# Patient Record
Sex: Female | Born: 1935 | Race: Black or African American | Hispanic: No | Smoking: Never smoker
Health system: Southern US, Community
[De-identification: ages and names within clinical notes are randomized; demographics above are authoritative.]

## PROBLEM LIST (undated history)

## (undated) DIAGNOSIS — I509 Heart failure, unspecified: Secondary | ICD-10-CM

## (undated) DIAGNOSIS — C801 Malignant (primary) neoplasm, unspecified: Secondary | ICD-10-CM

## (undated) DIAGNOSIS — M75111 Incomplete rotator cuff tear or rupture of right shoulder, not specified as traumatic: Secondary | ICD-10-CM

## (undated) DIAGNOSIS — M75112 Incomplete rotator cuff tear or rupture of left shoulder, not specified as traumatic: Secondary | ICD-10-CM

## (undated) DIAGNOSIS — I639 Cerebral infarction, unspecified: Secondary | ICD-10-CM

## (undated) DIAGNOSIS — J449 Chronic obstructive pulmonary disease, unspecified: Secondary | ICD-10-CM

## (undated) DIAGNOSIS — F419 Anxiety disorder, unspecified: Secondary | ICD-10-CM

## (undated) DIAGNOSIS — J45909 Unspecified asthma, uncomplicated: Secondary | ICD-10-CM

## (undated) DIAGNOSIS — E785 Hyperlipidemia, unspecified: Secondary | ICD-10-CM

## (undated) DIAGNOSIS — N289 Disorder of kidney and ureter, unspecified: Secondary | ICD-10-CM

## (undated) DIAGNOSIS — I340 Nonrheumatic mitral (valve) insufficiency: Secondary | ICD-10-CM

## (undated) DIAGNOSIS — I480 Paroxysmal atrial fibrillation: Secondary | ICD-10-CM

## (undated) DIAGNOSIS — M21372 Foot drop, left foot: Secondary | ICD-10-CM

## (undated) DIAGNOSIS — I10 Essential (primary) hypertension: Secondary | ICD-10-CM

## (undated) HISTORY — DX: Anxiety disorder, unspecified: F41.9

## (undated) HISTORY — DX: Malignant (primary) neoplasm, unspecified: C80.1

## (undated) HISTORY — PX: LIVER RESECTION: SHX1977

## (undated) HISTORY — DX: Paroxysmal atrial fibrillation: I48.0

## (undated) HISTORY — PX: OTHER SURGICAL HISTORY: SHX169

## (undated) HISTORY — DX: Essential (primary) hypertension: I10

## (undated) HISTORY — DX: Cerebral infarction, unspecified: I63.9

## (undated) HISTORY — DX: Unspecified asthma, uncomplicated: J45.909

## (undated) HISTORY — PX: JOINT REPLACEMENT: SHX530

## (undated) HISTORY — DX: Hyperlipidemia, unspecified: E78.5

## (undated) HISTORY — PX: BACK SURGERY: SHX140

---

## 2009-02-02 ENCOUNTER — Ambulatory Visit: Payer: Self-pay | Admitting: Unknown Physician Specialty

## 2009-02-03 ENCOUNTER — Ambulatory Visit: Payer: Self-pay | Admitting: Oncology

## 2009-02-15 ENCOUNTER — Ambulatory Visit: Payer: Self-pay | Admitting: Oncology

## 2009-03-06 ENCOUNTER — Ambulatory Visit: Payer: Self-pay | Admitting: Oncology

## 2009-05-06 ENCOUNTER — Ambulatory Visit: Payer: Self-pay | Admitting: Oncology

## 2009-05-29 ENCOUNTER — Ambulatory Visit: Payer: Self-pay | Admitting: Oncology

## 2009-06-06 ENCOUNTER — Ambulatory Visit: Payer: Self-pay | Admitting: Oncology

## 2009-08-18 ENCOUNTER — Ambulatory Visit: Payer: Self-pay | Admitting: Unknown Physician Specialty

## 2009-11-03 ENCOUNTER — Ambulatory Visit: Payer: Self-pay | Admitting: Oncology

## 2009-11-15 ENCOUNTER — Ambulatory Visit: Payer: Self-pay | Admitting: Oncology

## 2009-11-17 ENCOUNTER — Ambulatory Visit: Payer: Self-pay | Admitting: Oncology

## 2009-12-04 ENCOUNTER — Ambulatory Visit: Payer: Self-pay | Admitting: Oncology

## 2009-12-24 ENCOUNTER — Inpatient Hospital Stay: Payer: Self-pay | Admitting: Unknown Physician Specialty

## 2010-01-26 ENCOUNTER — Ambulatory Visit: Payer: Self-pay | Admitting: Family

## 2010-04-05 ENCOUNTER — Ambulatory Visit: Payer: Self-pay

## 2010-04-10 ENCOUNTER — Ambulatory Visit: Payer: Self-pay | Admitting: Unknown Physician Specialty

## 2010-05-21 ENCOUNTER — Ambulatory Visit: Payer: Self-pay | Admitting: Oncology

## 2010-06-06 ENCOUNTER — Ambulatory Visit: Payer: Self-pay | Admitting: Oncology

## 2010-11-22 ENCOUNTER — Ambulatory Visit: Payer: Self-pay | Admitting: Oncology

## 2010-12-05 ENCOUNTER — Ambulatory Visit: Payer: Self-pay | Admitting: Oncology

## 2010-12-07 ENCOUNTER — Ambulatory Visit: Payer: Self-pay | Admitting: Oncology

## 2010-12-08 ENCOUNTER — Ambulatory Visit: Payer: Self-pay | Admitting: Oncology

## 2011-04-01 ENCOUNTER — Ambulatory Visit: Payer: Self-pay | Admitting: Oncology

## 2011-04-06 ENCOUNTER — Ambulatory Visit: Payer: Self-pay | Admitting: Oncology

## 2011-04-09 ENCOUNTER — Inpatient Hospital Stay: Payer: Self-pay | Admitting: Family Medicine

## 2011-04-12 ENCOUNTER — Ambulatory Visit: Payer: Self-pay | Admitting: Oncology

## 2011-05-07 ENCOUNTER — Ambulatory Visit: Payer: Self-pay | Admitting: Oncology

## 2011-05-08 ENCOUNTER — Ambulatory Visit: Payer: Self-pay | Admitting: Gastroenterology

## 2011-05-09 ENCOUNTER — Ambulatory Visit: Payer: Self-pay | Admitting: Oncology

## 2011-05-14 LAB — MAGNESIUM: Magnesium: 1.7 mg/dL — ABNORMAL LOW

## 2011-05-14 LAB — CBC CANCER CENTER
Basophil #: 0.1 x10 3/mm (ref 0.0–0.1)
Eosinophil #: 0.7 x10 3/mm (ref 0.0–0.7)
HCT: 27.7 % — ABNORMAL LOW (ref 35.0–47.0)
Lymphocyte #: 1.5 x10 3/mm (ref 1.0–3.6)
Lymphocyte %: 16.7 %
MCH: 28.2 pg (ref 26.0–34.0)
Monocyte #: 0.8 x10 3/mm — ABNORMAL HIGH (ref 0.0–0.7)
Monocyte %: 9 %
Neutrophil #: 6 x10 3/mm (ref 1.4–6.5)
Platelet: 230 x10 3/mm (ref 150–440)
RDW: 13.3 % (ref 11.5–14.5)
WBC: 9 x10 3/mm (ref 3.6–11.0)

## 2011-05-14 LAB — COMPREHENSIVE METABOLIC PANEL
Bilirubin,Total: 0.2 mg/dL (ref 0.2–1.0)
Calcium, Total: 9 mg/dL (ref 8.5–10.1)
Chloride: 108 mmol/L — ABNORMAL HIGH (ref 98–107)
Co2: 28 mmol/L (ref 21–32)
Creatinine: 0.92 mg/dL (ref 0.60–1.30)
EGFR (Non-African Amer.): >60
Glucose: 95 mg/dL (ref 65–99)
Osmolality: 290 (ref 275–301)
SGPT (ALT): 10 U/L — ABNORMAL LOW
Total Protein: 6.9 g/dL (ref 6.4–8.2)

## 2011-06-07 ENCOUNTER — Ambulatory Visit: Payer: Self-pay | Admitting: Oncology

## 2011-06-25 LAB — CBC CANCER CENTER
Basophil #: 0 x10 3/mm (ref 0.0–0.1)
Eosinophil #: 0 x10 3/mm (ref 0.0–0.7)
Lymphocyte #: 0.7 x10 3/mm — ABNORMAL LOW (ref 1.0–3.6)
Monocyte %: 0.9 %
Neutrophil #: 10.8 x10 3/mm — ABNORMAL HIGH (ref 1.4–6.5)
Neutrophil %: 92.5 %
Platelet: 209 x10 3/mm (ref 150–440)
RBC: 3.1 10*6/uL — ABNORMAL LOW (ref 3.80–5.20)
WBC: 11.6 x10 3/mm — ABNORMAL HIGH (ref 3.6–11.0)

## 2011-06-25 LAB — COMPREHENSIVE METABOLIC PANEL
Albumin: 3.4 g/dL (ref 3.4–5.0)
Alkaline Phosphatase: 50 U/L (ref 50–136)
BUN: 21 mg/dL — ABNORMAL HIGH (ref 7–18)
Chloride: 106 mmol/L (ref 98–107)
EGFR (African American): 49 — ABNORMAL LOW
EGFR (Non-African Amer.): 41 — ABNORMAL LOW
Glucose: 136 mg/dL — ABNORMAL HIGH (ref 65–99)
Osmolality: 288 (ref 275–301)
SGOT(AST): 32 U/L (ref 15–37)
SGPT (ALT): 18 U/L
Sodium: 142 mmol/L (ref 136–145)
Total Protein: 6.6 g/dL (ref 6.4–8.2)

## 2011-06-25 LAB — IRON AND TIBC: Iron Saturation: 18 %

## 2011-06-25 LAB — MAGNESIUM: Magnesium: 1.8 mg/dL

## 2011-07-05 ENCOUNTER — Ambulatory Visit: Payer: Self-pay | Admitting: Oncology

## 2011-07-17 ENCOUNTER — Ambulatory Visit: Payer: Self-pay | Admitting: Unknown Physician Specialty

## 2011-09-17 ENCOUNTER — Ambulatory Visit: Payer: Self-pay | Admitting: Oncology

## 2011-09-17 LAB — COMPREHENSIVE METABOLIC PANEL WITH GFR
Albumin: 3.7 g/dL (ref 3.4–5.0)
Alkaline Phosphatase: 40 U/L — ABNORMAL LOW (ref 50–136)
Anion Gap: 7 (ref 7–16)
BUN: 18 mg/dL (ref 7–18)
Bilirubin,Total: 0.2 mg/dL (ref 0.2–1.0)
Calcium, Total: 8.5 mg/dL (ref 8.5–10.1)
Chloride: 107 mmol/L (ref 98–107)
Co2: 28 mmol/L (ref 21–32)
Creatinine: 1.13 mg/dL (ref 0.60–1.30)
EGFR (African American): 55 — ABNORMAL LOW
EGFR (Non-African Amer.): 47 — ABNORMAL LOW
Glucose: 103 mg/dL — ABNORMAL HIGH (ref 65–99)
Osmolality: 285 (ref 275–301)
Potassium: 4.1 mmol/L (ref 3.5–5.1)
SGOT(AST): 36 U/L (ref 15–37)
SGPT (ALT): 17 U/L
Sodium: 142 mmol/L (ref 136–145)
Total Protein: 6.8 g/dL (ref 6.4–8.2)

## 2011-09-17 LAB — CBC CANCER CENTER
Basophil #: 0.1 x10 3/mm (ref 0.0–0.1)
Basophil %: 1.1 %
Eosinophil #: 0.8 x10 3/mm — ABNORMAL HIGH (ref 0.0–0.7)
Eosinophil %: 13.1 %
HCT: 27.3 % — ABNORMAL LOW (ref 35.0–47.0)
HGB: 8.6 g/dL — ABNORMAL LOW (ref 12.0–16.0)
Lymphocyte %: 20.4 %
Lymphs Abs: 1.2 x10 3/mm (ref 1.0–3.6)
MCH: 27.7 pg (ref 26.0–34.0)
MCHC: 31.4 g/dL — ABNORMAL LOW (ref 32.0–36.0)
MCV: 88 fL (ref 80–100)
Monocyte #: 0.5 x10 3/mm (ref 0.2–0.9)
Monocyte %: 8.9 %
Neutrophil #: 3.4 x10 3/mm (ref 1.4–6.5)
Neutrophil %: 56.5 %
Platelet: 185 x10 3/mm (ref 150–440)
RBC: 3.09 x10 6/mm — ABNORMAL LOW (ref 3.80–5.20)
RDW: 13.4 % (ref 11.5–14.5)
WBC: 6 x10 3/mm (ref 3.6–11.0)

## 2011-10-05 ENCOUNTER — Ambulatory Visit: Payer: Self-pay | Admitting: Oncology

## 2011-10-10 LAB — CBC CANCER CENTER
Basophil %: 0.6 %
Eosinophil %: 4.8 %
HCT: 27.1 % — ABNORMAL LOW (ref 35.0–47.0)
HGB: 8.6 g/dL — ABNORMAL LOW (ref 12.0–16.0)
Lymphocyte #: 2.2 x10 3/mm (ref 1.0–3.6)
MCHC: 31.7 g/dL — ABNORMAL LOW (ref 32.0–36.0)
Neutrophil #: 3.8 x10 3/mm (ref 1.4–6.5)
RBC: 3.05 10*6/uL — ABNORMAL LOW (ref 3.80–5.20)
RDW: 13.7 % (ref 11.5–14.5)
WBC: 7.2 x10 3/mm (ref 3.6–11.0)

## 2011-11-04 ENCOUNTER — Ambulatory Visit: Payer: Self-pay | Admitting: Oncology

## 2011-11-17 ENCOUNTER — Emergency Department: Payer: Self-pay | Admitting: Emergency Medicine

## 2011-11-17 LAB — CBC WITH DIFFERENTIAL/PLATELET
Eosinophil #: 0.5 10*3/uL (ref 0.0–0.7)
HCT: 27.7 % — ABNORMAL LOW (ref 35.0–47.0)
Lymphocyte %: 26.6 %
Monocyte %: 11.6 %
Neutrophil %: 50.3 %
Platelet: 153 10*3/uL (ref 150–440)
RBC: 3.1 10*6/uL — ABNORMAL LOW (ref 3.80–5.20)
RDW: 14.1 % (ref 11.5–14.5)
WBC: 5 10*3/uL (ref 3.6–11.0)

## 2011-11-17 LAB — BASIC METABOLIC PANEL WITH GFR
Anion Gap: 6 — ABNORMAL LOW
BUN: 17 mg/dL
Calcium, Total: 8.3 mg/dL — ABNORMAL LOW
Chloride: 110 mmol/L — ABNORMAL HIGH
Co2: 28 mmol/L
Creatinine: 1.22 mg/dL
EGFR (African American): 50 — ABNORMAL LOW
EGFR (Non-African Amer.): 43 — ABNORMAL LOW
Glucose: 74 mg/dL
Osmolality: 287
Potassium: 3.9 mmol/L
Sodium: 144 mmol/L

## 2012-01-09 ENCOUNTER — Ambulatory Visit: Payer: Self-pay | Admitting: Oncology

## 2012-01-09 LAB — CBC CANCER CENTER
Basophil #: 0.1 x10 3/mm (ref 0.0–0.1)
Basophil %: 0.9 %
Eosinophil #: 0.2 x10 3/mm (ref 0.0–0.7)
HGB: 8.7 g/dL — ABNORMAL LOW (ref 12.0–16.0)
Lymphocyte #: 1.6 x10 3/mm (ref 1.0–3.6)
MCH: 26.8 pg (ref 26.0–34.0)
MCHC: 31 g/dL — ABNORMAL LOW (ref 32.0–36.0)
MCV: 87 fL (ref 80–100)
Monocyte #: 0.7 x10 3/mm (ref 0.2–0.9)
Neutrophil %: 56.1 %

## 2012-01-09 LAB — FERRITIN: Ferritin (ARMC): 13 ng/mL (ref 8–388)

## 2012-01-09 LAB — IRON AND TIBC
Iron Bind.Cap.(Total): 400 ug/dL (ref 250–450)
Iron Saturation: 11 %

## 2012-01-09 LAB — COMPREHENSIVE METABOLIC PANEL
Alkaline Phosphatase: 71 U/L (ref 50–136)
Anion Gap: 5 — ABNORMAL LOW (ref 7–16)
BUN: 26 mg/dL — ABNORMAL HIGH (ref 7–18)
Bilirubin,Total: 0.2 mg/dL (ref 0.2–1.0)
Calcium, Total: 8.7 mg/dL (ref 8.5–10.1)
Co2: 28 mmol/L (ref 21–32)
EGFR (Non-African Amer.): 39 — ABNORMAL LOW
Glucose: 89 mg/dL (ref 65–99)
Osmolality: 285 (ref 275–301)
Potassium: 4.7 mmol/L (ref 3.5–5.1)
SGPT (ALT): 20 U/L (ref 12–78)

## 2012-02-04 ENCOUNTER — Ambulatory Visit: Payer: Self-pay | Admitting: Oncology

## 2012-04-23 ENCOUNTER — Ambulatory Visit: Payer: Self-pay | Admitting: Oncology

## 2012-04-23 LAB — CBC CANCER CENTER
Basophil #: 0 x10 3/mm (ref 0.0–0.1)
Basophil %: 0.4 %
HCT: 28.1 % — ABNORMAL LOW (ref 35.0–47.0)
Lymphocyte #: 1.5 x10 3/mm (ref 1.0–3.6)
MCH: 26.9 pg (ref 26.0–34.0)
MCV: 85 fL (ref 80–100)
Monocyte %: 12.1 %
Neutrophil #: 5.3 x10 3/mm (ref 1.4–6.5)
Platelet: 140 x10 3/mm — ABNORMAL LOW (ref 150–440)
RDW: 15.7 % — ABNORMAL HIGH (ref 11.5–14.5)
WBC: 7.8 x10 3/mm (ref 3.6–11.0)

## 2012-04-23 LAB — COMPREHENSIVE METABOLIC PANEL
Albumin: 3.9 g/dL (ref 3.4–5.0)
Alkaline Phosphatase: 44 U/L — ABNORMAL LOW (ref 50–136)
Calcium, Total: 8.8 mg/dL (ref 8.5–10.1)
Co2: 24 mmol/L (ref 21–32)
Glucose: 72 mg/dL (ref 65–99)
Osmolality: 291 (ref 275–301)
Potassium: 3.7 mmol/L (ref 3.5–5.1)
SGOT(AST): 40 U/L — ABNORMAL HIGH (ref 15–37)

## 2012-05-06 ENCOUNTER — Ambulatory Visit: Payer: Self-pay | Admitting: Oncology

## 2012-05-15 ENCOUNTER — Ambulatory Visit: Payer: Self-pay | Admitting: Cardiology

## 2012-05-29 ENCOUNTER — Emergency Department: Payer: Self-pay | Admitting: Emergency Medicine

## 2012-07-30 ENCOUNTER — Ambulatory Visit: Payer: Self-pay | Admitting: Oncology

## 2012-09-07 ENCOUNTER — Inpatient Hospital Stay: Payer: Self-pay | Admitting: Internal Medicine

## 2012-09-07 LAB — CK TOTAL AND CKMB (NOT AT ARMC)
CK, Total: 499 U/L — ABNORMAL HIGH (ref 21–215)
CK, Total: 460 U/L — ABNORMAL HIGH (ref 21–215)
CK-MB: 6.4 ng/mL — ABNORMAL HIGH (ref 0.5–3.6)
CK-MB: 5.5 ng/mL — ABNORMAL HIGH (ref 0.5–3.6)

## 2012-09-07 LAB — URINALYSIS, COMPLETE
Nitrite: NEGATIVE
Ph: 6 (ref 4.5–8.0)
Protein: 30
Specific Gravity: 1.018 (ref 1.003–1.030)

## 2012-09-07 LAB — CBC
HGB: 8.6 g/dL — ABNORMAL LOW (ref 12.0–16.0)
Platelet: 170 10*3/uL (ref 150–440)
RBC: 3.15 10*6/uL — ABNORMAL LOW (ref 3.80–5.20)
RDW: 13.5 % (ref 11.5–14.5)

## 2012-09-07 LAB — TROPONIN I
Troponin-I: 0.13 ng/mL — ABNORMAL HIGH
Troponin-I: 0.11 ng/mL — ABNORMAL HIGH

## 2012-09-07 LAB — COMPREHENSIVE METABOLIC PANEL
Albumin: 3.6 g/dL (ref 3.4–5.0)
Alkaline Phosphatase: 32 U/L — ABNORMAL LOW (ref 50–136)
Anion Gap: 5 — ABNORMAL LOW (ref 7–16)
Calcium, Total: 8.7 mg/dL (ref 8.5–10.1)
Chloride: 113 mmol/L — ABNORMAL HIGH (ref 98–107)
Co2: 28 mmol/L (ref 21–32)
Glucose: 75 mg/dL (ref 65–99)
SGPT (ALT): 17 U/L (ref 12–78)
Total Protein: 6.2 g/dL — ABNORMAL LOW (ref 6.4–8.2)

## 2012-09-07 LAB — PRO B NATRIURETIC PEPTIDE: B-Type Natriuretic Peptide: 3401 pg/mL — ABNORMAL HIGH (ref 0–450)

## 2012-09-08 LAB — BASIC METABOLIC PANEL
Anion Gap: 4 — ABNORMAL LOW (ref 7–16)
BUN: 18 mg/dL (ref 7–18)
Calcium, Total: 8.4 mg/dL — ABNORMAL LOW (ref 8.5–10.1)
Chloride: 107 mmol/L (ref 98–107)
Co2: 31 mmol/L (ref 21–32)
Osmolality: 289 (ref 275–301)
Potassium: 4.1 mmol/L (ref 3.5–5.1)

## 2012-09-08 LAB — LIPID PANEL
Cholesterol: 178 mg/dL (ref 0–200)
HDL Cholesterol: 77 mg/dL — ABNORMAL HIGH (ref 40–60)
Triglycerides: 31 mg/dL (ref 0–200)

## 2012-09-08 LAB — TROPONIN I: Troponin-I: 0.09 ng/mL — ABNORMAL HIGH

## 2012-09-08 LAB — CK TOTAL AND CKMB (NOT AT ARMC)
CK, Total: 462 U/L — ABNORMAL HIGH (ref 21–215)
CK-MB: 4.5 ng/mL — ABNORMAL HIGH (ref 0.5–3.6)

## 2012-09-08 LAB — MAGNESIUM: Magnesium: 1.5 mg/dL — ABNORMAL LOW

## 2012-09-08 LAB — TSH: Thyroid Stimulating Horm: 0.461 u[IU]/mL

## 2012-09-09 LAB — BASIC METABOLIC PANEL
Anion Gap: 8 (ref 7–16)
BUN: 26 mg/dL — ABNORMAL HIGH (ref 7–18)
BUN: 27 mg/dL — ABNORMAL HIGH (ref 7–18)
Calcium, Total: 8.2 mg/dL — ABNORMAL LOW (ref 8.5–10.1)
Calcium, Total: 8.2 mg/dL — ABNORMAL LOW (ref 8.5–10.1)
Chloride: 101 mmol/L (ref 98–107)
Chloride: 101 mmol/L (ref 98–107)
Co2: 28 mmol/L (ref 21–32)
Co2: 28 mmol/L (ref 21–32)
Creatinine: 1.39 mg/dL — ABNORMAL HIGH (ref 0.60–1.30)
EGFR (African American): 42 — ABNORMAL LOW
Glucose: 156 mg/dL — ABNORMAL HIGH (ref 65–99)
Glucose: 138 mg/dL — ABNORMAL HIGH (ref 65–99)
Osmolality: 276 (ref 275–301)
Sodium: 137 mmol/L (ref 136–145)
Sodium: 134 mmol/L — ABNORMAL LOW (ref 136–145)

## 2012-09-09 LAB — HEMOGLOBIN: HGB: 8.1 g/dL — ABNORMAL LOW (ref 12.0–16.0)

## 2012-09-10 LAB — BASIC METABOLIC PANEL
Anion Gap: 8 (ref 7–16)
Anion Gap: 12 (ref 7–16)
BUN: 36 mg/dL — ABNORMAL HIGH (ref 7–18)
BUN: 37 mg/dL — ABNORMAL HIGH (ref 7–18)
Calcium, Total: 8.3 mg/dL — ABNORMAL LOW (ref 8.5–10.1)
Calcium, Total: 8 mg/dL — ABNORMAL LOW (ref 8.5–10.1)
Chloride: 98 mmol/L (ref 98–107)
Co2: 30 mmol/L (ref 21–32)
Co2: 26 mmol/L (ref 21–32)
Creatinine: 1.47 mg/dL — ABNORMAL HIGH (ref 0.60–1.30)
Creatinine: 1.6 mg/dL — ABNORMAL HIGH (ref 0.60–1.30)
EGFR (African American): 39 — ABNORMAL LOW
EGFR (Non-African Amer.): 34 — ABNORMAL LOW
EGFR (Non-African Amer.): 31 — ABNORMAL LOW
Osmolality: 285 (ref 275–301)
Potassium: 3.4 mmol/L — ABNORMAL LOW (ref 3.5–5.1)
Sodium: 134 mmol/L — ABNORMAL LOW (ref 136–145)

## 2012-09-11 LAB — COMPREHENSIVE METABOLIC PANEL
Anion Gap: 8 (ref 7–16)
Calcium, Total: 8 mg/dL — ABNORMAL LOW (ref 8.5–10.1)
Creatinine: 1.32 mg/dL — ABNORMAL HIGH (ref 0.60–1.30)
Glucose: 153 mg/dL — ABNORMAL HIGH (ref 65–99)
Osmolality: 287 (ref 275–301)
Potassium: 3.3 mmol/L — ABNORMAL LOW (ref 3.5–5.1)
SGOT(AST): 40 U/L — ABNORMAL HIGH (ref 15–37)
SGPT (ALT): 27 U/L (ref 12–78)
Sodium: 138 mmol/L (ref 136–145)
Total Protein: 5.7 g/dL — ABNORMAL LOW (ref 6.4–8.2)

## 2012-09-11 LAB — HEMOGLOBIN: HGB: 7.8 g/dL — ABNORMAL LOW (ref 12.0–16.0)

## 2012-09-11 LAB — MAGNESIUM: Magnesium: 2.1 mg/dL

## 2012-09-11 LAB — IRON AND TIBC
Iron Bind.Cap.(Total): 333 ug/dL (ref 250–450)
Iron: 15 ug/dL — ABNORMAL LOW (ref 50–170)
Unbound Iron-Bind.Cap.: 318 ug/dL

## 2012-09-11 LAB — FERRITIN: Ferritin (ARMC): 12 ng/mL (ref 8–388)

## 2012-10-04 ENCOUNTER — Ambulatory Visit: Payer: Self-pay | Admitting: Oncology

## 2012-10-22 LAB — CBC CANCER CENTER
Basophil #: 0 x10 3/mm (ref 0.0–0.1)
Basophil %: 0.5 %
Eosinophil %: 0.2 %
HGB: 8.4 g/dL — ABNORMAL LOW (ref 12.0–16.0)
Lymphocyte %: 10.1 %
MCH: 28.7 pg (ref 26.0–34.0)
MCHC: 32.9 g/dL (ref 32.0–36.0)
MCV: 87 fL (ref 80–100)
Monocyte #: 0.3 x10 3/mm (ref 0.2–0.9)
Monocyte %: 3.3 %
Neutrophil #: 6.5 x10 3/mm (ref 1.4–6.5)
Neutrophil %: 85.9 %
Platelet: 212 x10 3/mm (ref 150–440)
RDW: 19 % — ABNORMAL HIGH (ref 11.5–14.5)
WBC: 7.6 x10 3/mm (ref 3.6–11.0)

## 2012-10-22 LAB — COMPREHENSIVE METABOLIC PANEL
Albumin: 3.9 g/dL (ref 3.4–5.0)
BUN: 22 mg/dL — ABNORMAL HIGH (ref 7–18)
Bilirubin,Total: 0.2 mg/dL (ref 0.2–1.0)
Calcium, Total: 8.8 mg/dL (ref 8.5–10.1)
Co2: 26 mmol/L (ref 21–32)
Creatinine: 1.5 mg/dL — ABNORMAL HIGH (ref 0.60–1.30)
Glucose: 117 mg/dL — ABNORMAL HIGH (ref 65–99)
Osmolality: 286 (ref 275–301)
Potassium: 4.3 mmol/L (ref 3.5–5.1)
SGPT (ALT): 22 U/L (ref 12–78)

## 2012-11-03 ENCOUNTER — Ambulatory Visit: Payer: Self-pay | Admitting: Oncology

## 2013-01-19 ENCOUNTER — Ambulatory Visit: Payer: Self-pay | Admitting: Oncology

## 2013-01-20 LAB — COMPREHENSIVE METABOLIC PANEL
Albumin: 3.5 g/dL (ref 3.4–5.0)
Bilirubin,Total: 0.2 mg/dL (ref 0.2–1.0)
Calcium, Total: 8.8 mg/dL (ref 8.5–10.1)
Chloride: 109 mmol/L — ABNORMAL HIGH (ref 98–107)
Co2: 31 mmol/L (ref 21–32)
Creatinine: 1.23 mg/dL (ref 0.60–1.30)
EGFR (African American): 49 — ABNORMAL LOW
EGFR (Non-African Amer.): 42 — ABNORMAL LOW
Potassium: 4.1 mmol/L (ref 3.5–5.1)
SGPT (ALT): 23 U/L (ref 12–78)
Total Protein: 6.1 g/dL — ABNORMAL LOW (ref 6.4–8.2)

## 2013-01-20 LAB — CBC CANCER CENTER
Basophil %: 0.7 %
Eosinophil %: 2.5 %
HGB: 9.1 g/dL — ABNORMAL LOW (ref 12.0–16.0)
Lymphocyte #: 1.4 x10 3/mm (ref 1.0–3.6)
Lymphocyte %: 25.4 %
MCHC: 33 g/dL (ref 32.0–36.0)
Monocyte #: 0.7 x10 3/mm (ref 0.2–0.9)
Monocyte %: 11.9 %
Neutrophil %: 59.5 %
RBC: 3.12 10*6/uL — ABNORMAL LOW (ref 3.80–5.20)
WBC: 5.7 x10 3/mm (ref 3.6–11.0)

## 2013-02-03 ENCOUNTER — Ambulatory Visit: Payer: Self-pay | Admitting: Oncology

## 2013-04-19 ENCOUNTER — Ambulatory Visit: Payer: Self-pay | Admitting: Oncology

## 2013-04-21 ENCOUNTER — Ambulatory Visit: Payer: Self-pay | Admitting: Oncology

## 2013-04-21 LAB — CBC CANCER CENTER
Basophil #: 0 x10 3/mm (ref 0.0–0.1)
Basophil %: 0.5 %
Eosinophil %: 0.5 %
Lymphocyte #: 0.5 x10 3/mm — ABNORMAL LOW (ref 1.0–3.6)
MCH: 27.5 pg (ref 26.0–34.0)
MCV: 87 fL (ref 80–100)
Monocyte #: 0.1 x10 3/mm — ABNORMAL LOW (ref 0.2–0.9)
Monocyte %: 2.7 %
RDW: 13.8 % (ref 11.5–14.5)

## 2013-04-21 LAB — COMPREHENSIVE METABOLIC PANEL
Alkaline Phosphatase: 35 U/L — ABNORMAL LOW
Anion Gap: 9 (ref 7–16)
BUN: 12 mg/dL (ref 7–18)
Bilirubin,Total: 0.3 mg/dL (ref 0.2–1.0)
Chloride: 107 mmol/L (ref 98–107)
Co2: 27 mmol/L (ref 21–32)
Creatinine: 1.29 mg/dL (ref 0.60–1.30)
EGFR (African American): 46 — ABNORMAL LOW
Glucose: 116 mg/dL — ABNORMAL HIGH (ref 65–99)
Potassium: 4.7 mmol/L (ref 3.5–5.1)
SGOT(AST): 53 U/L — ABNORMAL HIGH (ref 15–37)
SGPT (ALT): 29 U/L (ref 12–78)
Sodium: 143 mmol/L (ref 136–145)

## 2013-05-06 ENCOUNTER — Ambulatory Visit: Payer: Self-pay | Admitting: Oncology

## 2013-06-24 ENCOUNTER — Emergency Department: Payer: Self-pay | Admitting: Emergency Medicine

## 2013-07-16 IMAGING — US US EXTREM LOW VENOUS BILAT
1 series · 14 of 24 positions shown · non-contrast
Comparison: none

REASON FOR EXAM: edema  pain  eval DVT
COMMENTS:

[Series 1: us extrem low venous bilat · 14 of 56 slices shown]
[im 1/56]
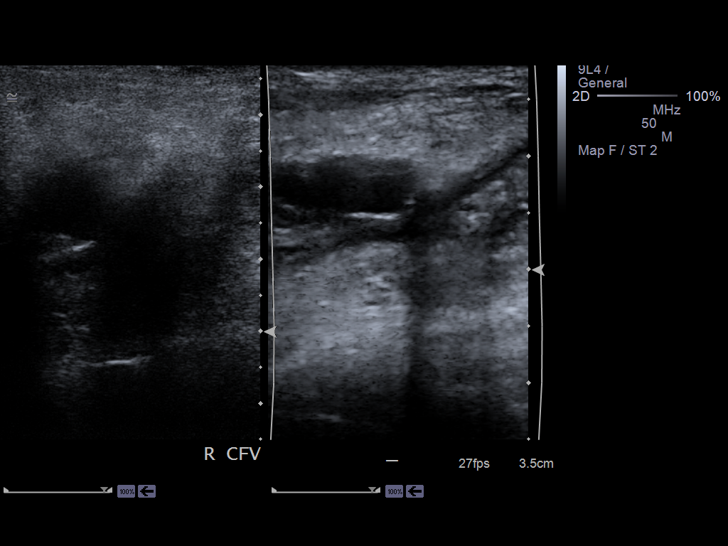
[im 5/56]
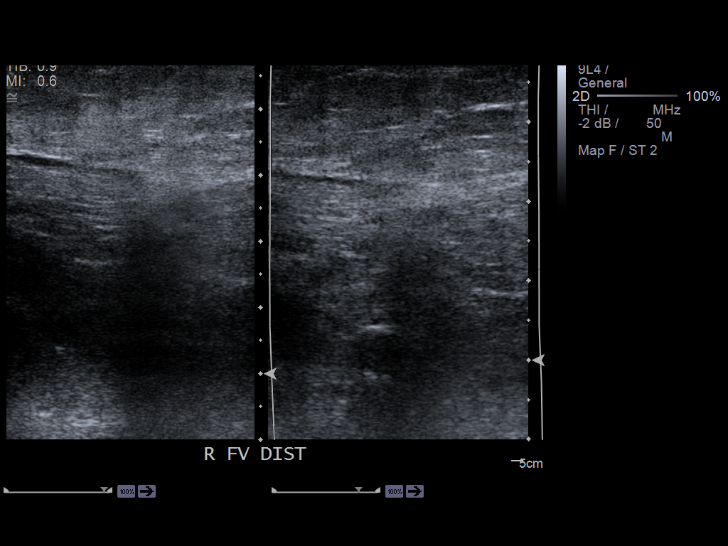
[im 10/56]
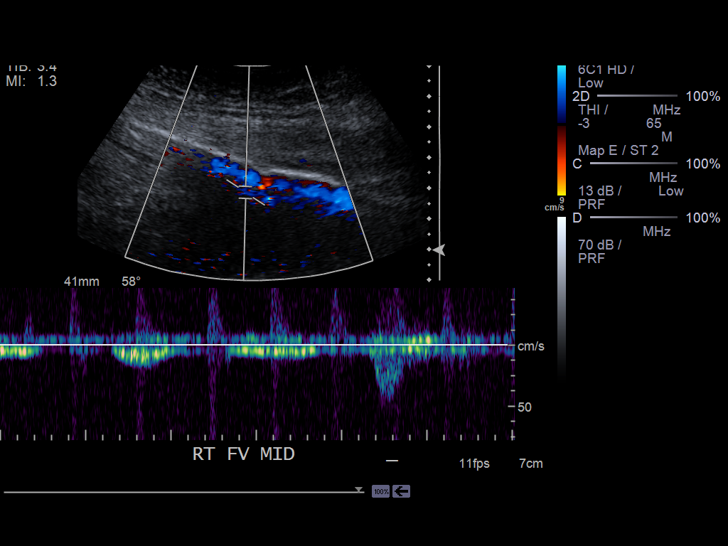
[im 15/56]
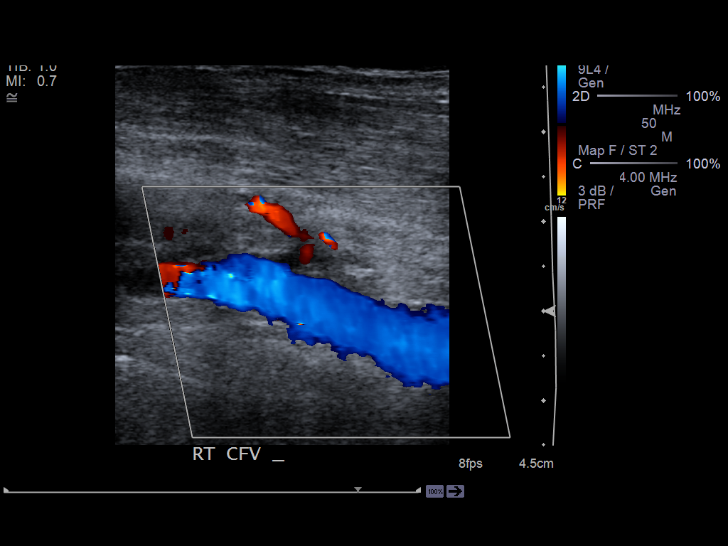
[im 17/56]
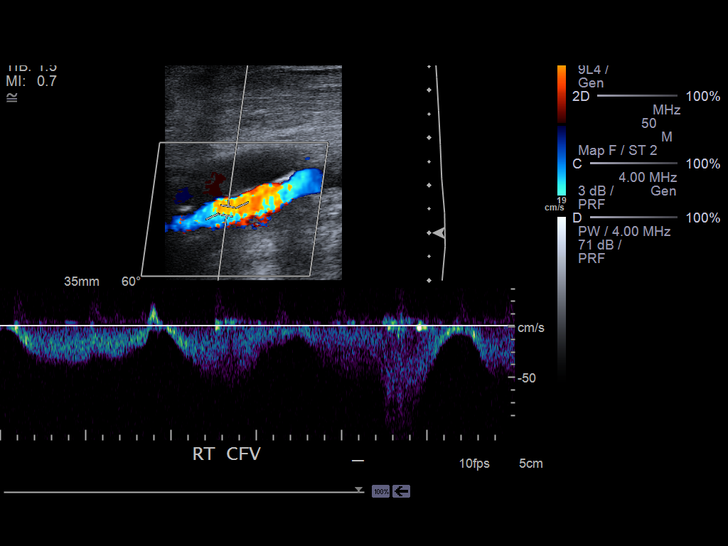
[im 22/56]
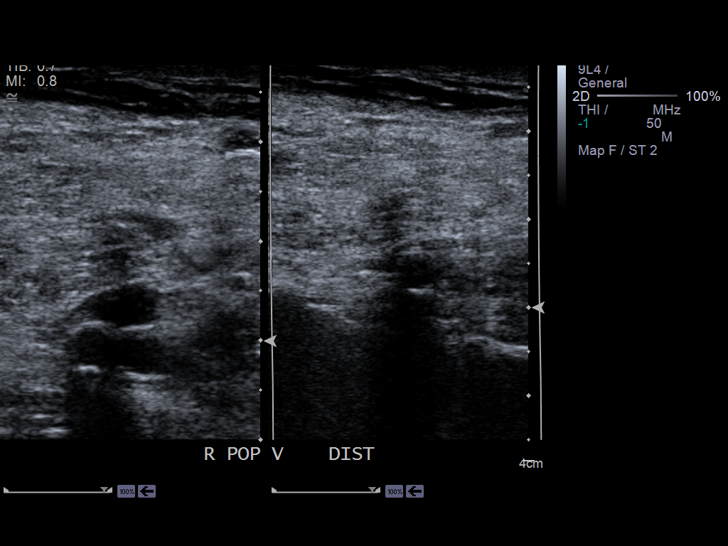
[im 27/56]
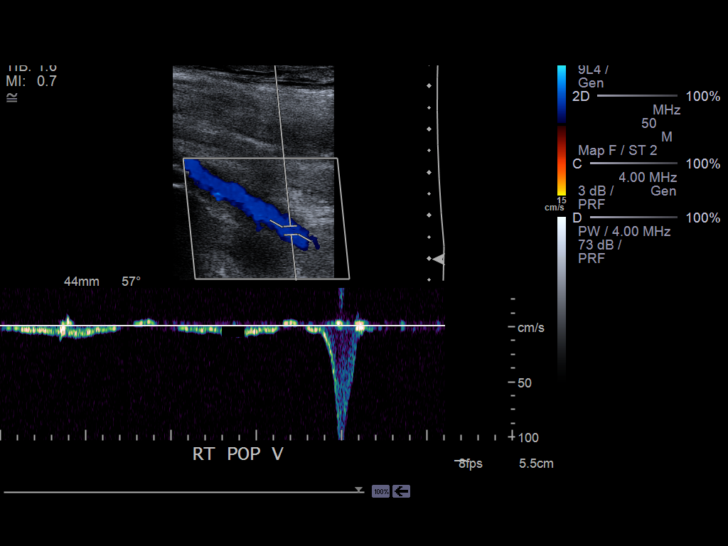
[im 29/56]
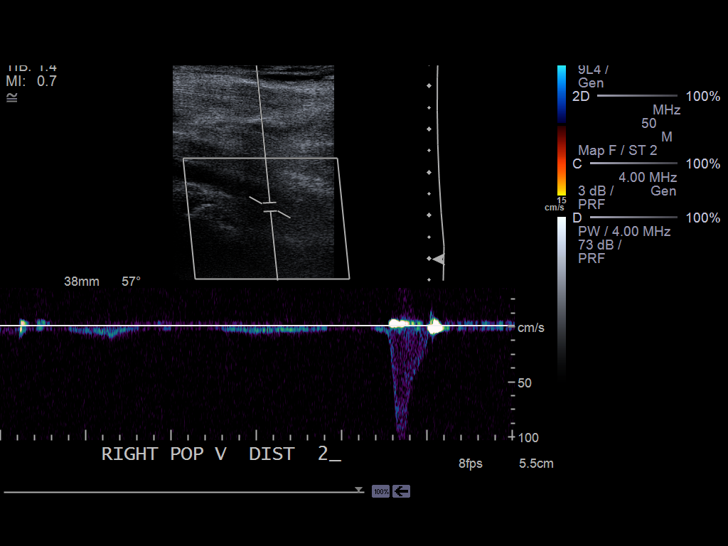
[im 34/56]
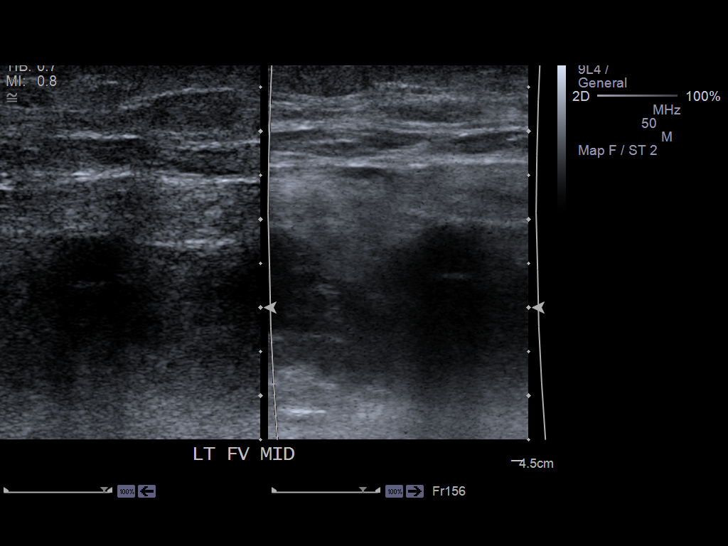
[im 39/56]
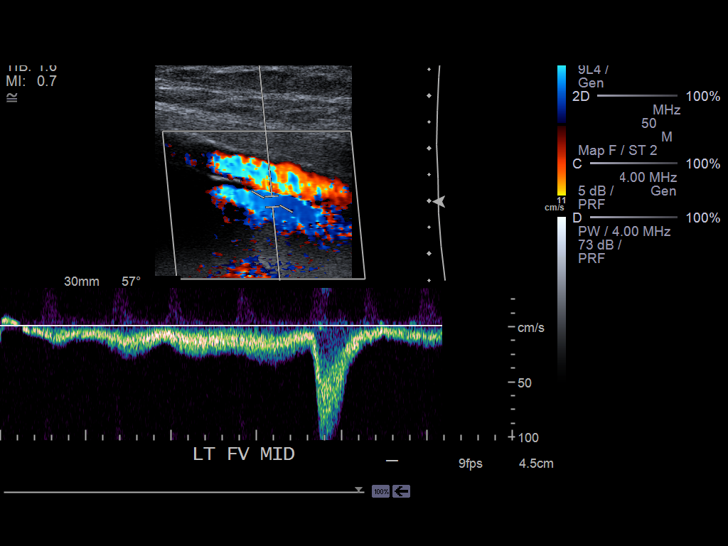
[im 44/56]
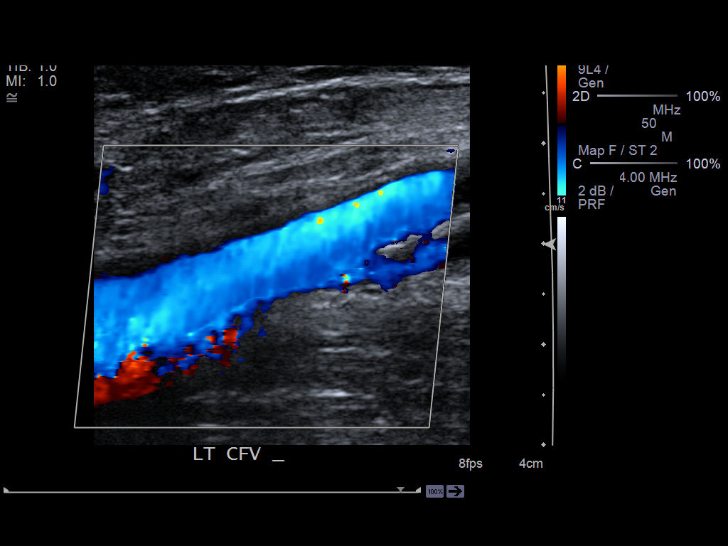
[im 46/56]
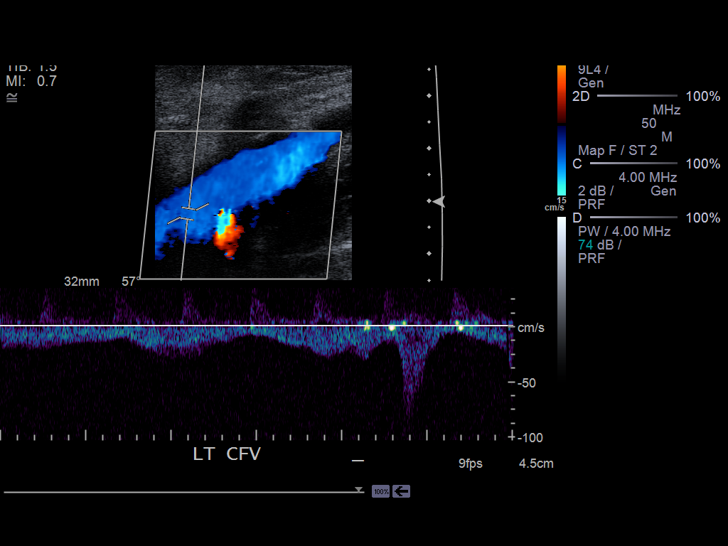
[im 51/56]
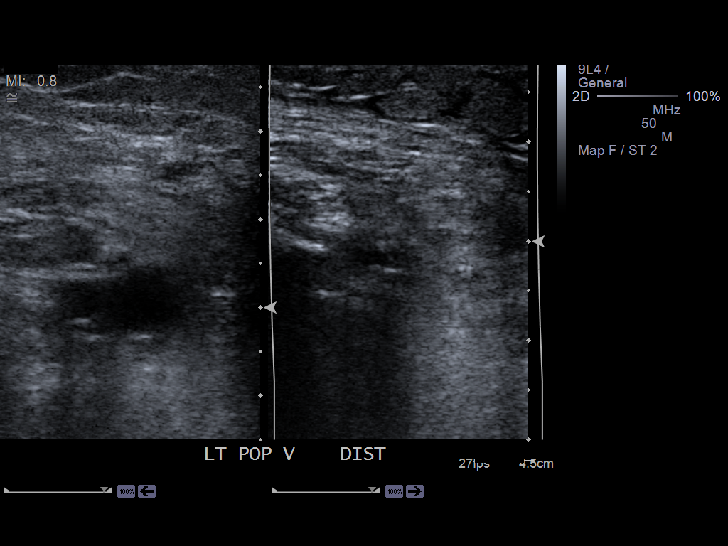
[im 56/56]
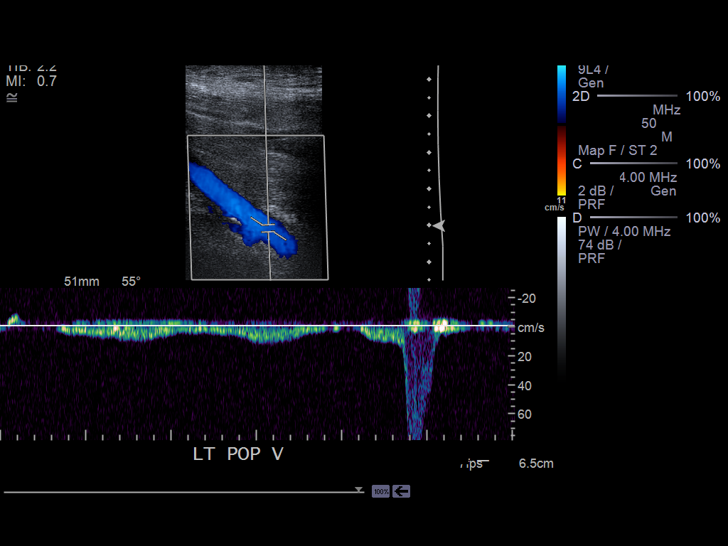

[14 of 24 positions shown; findings below may reference images not displayed]

PROCEDURE:     US  - US DOPPLER LOW EXTR BILATERAL  - May 15, 2012 [DATE]

RESULT:     Duplex Doppler interrogation of the deep venous system of both
legs from the inguinal to the popliteal region demonstrates the deep venous
systems are fully compressible throughout. The color Doppler and spectral
Doppler appearance is normal. There is normal response to distal
augmentation. The color Doppler images show no filling defect.
IMPRESSION: 1. No evidence of DVT in either lower extremity.

[REDACTED]

## 2013-07-24 LAB — BASIC METABOLIC PANEL
ANION GAP: 5 — AB (ref 7–16)
BUN: 18 mg/dL (ref 7–18)
CO2: 26 mmol/L (ref 21–32)
CREATININE: 1.24 mg/dL (ref 0.60–1.30)
Calcium, Total: 8.1 mg/dL — ABNORMAL LOW (ref 8.5–10.1)
Chloride: 110 mmol/L — ABNORMAL HIGH (ref 98–107)
EGFR (African American): 49 — ABNORMAL LOW
GFR CALC NON AF AMER: 42 — AB
GLUCOSE: 165 mg/dL — AB (ref 65–99)
Osmolality: 287 (ref 275–301)
POTASSIUM: 4 mmol/L (ref 3.5–5.1)
SODIUM: 141 mmol/L (ref 136–145)

## 2013-07-24 LAB — CBC
HCT: 31.9 % — AB (ref 35.0–47.0)
HGB: 10.4 g/dL — ABNORMAL LOW (ref 12.0–16.0)
MCH: 28.3 pg (ref 26.0–34.0)
MCHC: 32.6 g/dL (ref 32.0–36.0)
MCV: 87 fL (ref 80–100)
Platelet: 189 10*3/uL (ref 150–440)
RBC: 3.67 10*6/uL — AB (ref 3.80–5.20)
RDW: 14.4 % (ref 11.5–14.5)
WBC: 5.1 10*3/uL (ref 3.6–11.0)

## 2013-07-24 LAB — PRO B NATRIURETIC PEPTIDE: B-Type Natriuretic Peptide: 5396 pg/mL — ABNORMAL HIGH (ref 0–450)

## 2013-07-24 LAB — TROPONIN I: Troponin-I: 0.17 ng/mL — ABNORMAL HIGH

## 2013-07-25 ENCOUNTER — Inpatient Hospital Stay: Payer: Self-pay | Admitting: Internal Medicine

## 2013-07-25 LAB — TROPONIN I
TROPONIN-I: 0.2 ng/mL — AB
Troponin-I: 0.19 ng/mL — ABNORMAL HIGH

## 2013-07-25 LAB — CK-MB
CK-MB: 8.3 ng/mL — ABNORMAL HIGH (ref 0.5–3.6)
CK-MB: 7.1 ng/mL — AB (ref 0.5–3.6)
CK-MB: 6.6 ng/mL — ABNORMAL HIGH (ref 0.5–3.6)

## 2013-07-26 LAB — BASIC METABOLIC PANEL
ANION GAP: 3 — AB (ref 7–16)
BUN: 20 mg/dL — ABNORMAL HIGH (ref 7–18)
CO2: 27 mmol/L (ref 21–32)
Calcium, Total: 8.4 mg/dL — ABNORMAL LOW (ref 8.5–10.1)
Chloride: 110 mmol/L — ABNORMAL HIGH (ref 98–107)
Creatinine: 1.14 mg/dL (ref 0.60–1.30)
EGFR (African American): 54 — ABNORMAL LOW
EGFR (Non-African Amer.): 46 — ABNORMAL LOW
Glucose: 138 mg/dL — ABNORMAL HIGH (ref 65–99)
OSMOLALITY: 284 (ref 275–301)
POTASSIUM: 4.1 mmol/L (ref 3.5–5.1)
SODIUM: 140 mmol/L (ref 136–145)

## 2013-07-26 LAB — CBC WITH DIFFERENTIAL/PLATELET
Basophil #: 0 10*3/uL (ref 0.0–0.1)
Basophil %: 0.1 %
Eosinophil #: 0 10*3/uL (ref 0.0–0.7)
Eosinophil %: 0 %
HCT: 31.4 % — ABNORMAL LOW (ref 35.0–47.0)
HGB: 10.3 g/dL — ABNORMAL LOW (ref 12.0–16.0)
LYMPHS PCT: 11.1 %
Lymphocyte #: 0.5 10*3/uL — ABNORMAL LOW (ref 1.0–3.6)
MCH: 28.3 pg (ref 26.0–34.0)
MCHC: 32.8 g/dL (ref 32.0–36.0)
MCV: 86 fL (ref 80–100)
Monocyte #: 0.3 x10 3/mm (ref 0.2–0.9)
Monocyte %: 6 %
Neutrophil #: 3.5 10*3/uL (ref 1.4–6.5)
Neutrophil %: 82.8 %
PLATELETS: 111 10*3/uL — AB (ref 150–440)
RBC: 3.63 10*6/uL — AB (ref 3.80–5.20)
RDW: 14.3 % (ref 11.5–14.5)
WBC: 4.2 10*3/uL (ref 3.6–11.0)

## 2013-07-26 LAB — LIPID PANEL
CHOLESTEROL: 168 mg/dL (ref 0–200)
HDL Cholesterol: 83 mg/dL — ABNORMAL HIGH (ref 40–60)
LDL CHOLESTEROL, CALC: 81 mg/dL (ref 0–100)
Triglycerides: 22 mg/dL (ref 0–200)
VLDL CHOLESTEROL, CALC: 4 mg/dL — AB (ref 5–40)

## 2013-07-26 LAB — CLOSTRIDIUM DIFFICILE(ARMC)

## 2013-07-26 LAB — SEDIMENTATION RATE: ERYTHROCYTE SED RATE: 4 mm/h (ref 0–30)

## 2013-07-27 LAB — PLATELET COUNT: PLATELETS: 113 10*3/uL — AB (ref 150–440)

## 2013-07-27 LAB — WBCS, STOOL

## 2013-07-29 LAB — BASIC METABOLIC PANEL
Anion Gap: 6 — ABNORMAL LOW (ref 7–16)
BUN: 44 mg/dL — ABNORMAL HIGH (ref 7–18)
CHLORIDE: 102 mmol/L (ref 98–107)
Calcium, Total: 8.3 mg/dL — ABNORMAL LOW (ref 8.5–10.1)
Co2: 32 mmol/L (ref 21–32)
Creatinine: 1.75 mg/dL — ABNORMAL HIGH (ref 0.60–1.30)
EGFR (African American): 32 — ABNORMAL LOW
GFR CALC NON AF AMER: 28 — AB
GLUCOSE: 176 mg/dL — AB (ref 65–99)
Osmolality: 295 (ref 275–301)
Potassium: 3.3 mmol/L — ABNORMAL LOW (ref 3.5–5.1)
Sodium: 140 mmol/L (ref 136–145)

## 2013-07-29 LAB — STOOL CULTURE

## 2013-07-29 LAB — DIGOXIN LEVEL: Digoxin: 1.28 ng/mL

## 2013-07-30 LAB — BASIC METABOLIC PANEL
ANION GAP: 5 — AB (ref 7–16)
BUN: 45 mg/dL — ABNORMAL HIGH (ref 7–18)
CALCIUM: 8.8 mg/dL (ref 8.5–10.1)
CHLORIDE: 100 mmol/L (ref 98–107)
CO2: 34 mmol/L — AB (ref 21–32)
CREATININE: 1.67 mg/dL — AB (ref 0.60–1.30)
GFR CALC AF AMER: 34 — AB
GFR CALC NON AF AMER: 29 — AB
GLUCOSE: 163 mg/dL — AB (ref 65–99)
OSMOLALITY: 293 (ref 275–301)
POTASSIUM: 3.9 mmol/L (ref 3.5–5.1)
SODIUM: 139 mmol/L (ref 136–145)

## 2013-07-30 LAB — THEOPHYLLINE LEVEL: Theophylline: 9.2 ug/mL — ABNORMAL LOW (ref 10.0–20.0)

## 2013-07-30 LAB — CULTURE, BLOOD (SINGLE)

## 2013-07-31 LAB — BASIC METABOLIC PANEL
Anion Gap: 3 — ABNORMAL LOW (ref 7–16)
BUN: 50 mg/dL — ABNORMAL HIGH (ref 7–18)
Calcium, Total: 8.9 mg/dL (ref 8.5–10.1)
Chloride: 100 mmol/L (ref 98–107)
Co2: 36 mmol/L — ABNORMAL HIGH (ref 21–32)
Creatinine: 1.73 mg/dL — ABNORMAL HIGH (ref 0.60–1.30)
EGFR (African American): 32 — ABNORMAL LOW
EGFR (Non-African Amer.): 28 — ABNORMAL LOW
Glucose: 182 mg/dL — ABNORMAL HIGH (ref 65–99)
Osmolality: 296 (ref 275–301)
Potassium: 3.6 mmol/L (ref 3.5–5.1)
Sodium: 139 mmol/L (ref 136–145)

## 2013-07-31 LAB — WBC: WBC: 11.4 10*3/uL — ABNORMAL HIGH (ref 3.6–11.0)

## 2013-07-31 LAB — PLATELET COUNT: Platelet: 126 10*3/uL — ABNORMAL LOW (ref 150–440)

## 2013-08-16 ENCOUNTER — Ambulatory Visit: Payer: Self-pay | Admitting: Oncology

## 2013-08-17 LAB — CBC CANCER CENTER
Basophil #: 0 x10 3/mm (ref 0.0–0.1)
Basophil %: 0.2 %
EOS ABS: 0 x10 3/mm (ref 0.0–0.7)
Eosinophil %: 0.1 %
HCT: 32 % — ABNORMAL LOW (ref 35.0–47.0)
HGB: 10.1 g/dL — ABNORMAL LOW (ref 12.0–16.0)
Lymphocyte #: 0.4 x10 3/mm — ABNORMAL LOW (ref 1.0–3.6)
Lymphocyte %: 6.2 %
MCH: 27.7 pg (ref 26.0–34.0)
MCHC: 31.6 g/dL — AB (ref 32.0–36.0)
MCV: 88 fL (ref 80–100)
MONOS PCT: 2.7 %
Monocyte #: 0.2 x10 3/mm (ref 0.2–0.9)
NEUTROS ABS: 5.8 x10 3/mm (ref 1.4–6.5)
Neutrophil %: 90.8 %
Platelet: 110 x10 3/mm — ABNORMAL LOW (ref 150–440)
RBC: 3.65 10*6/uL — AB (ref 3.80–5.20)
RDW: 14.6 % — ABNORMAL HIGH (ref 11.5–14.5)
WBC: 6.4 x10 3/mm (ref 3.6–11.0)

## 2013-08-17 LAB — COMPREHENSIVE METABOLIC PANEL
ALBUMIN: 3.4 g/dL (ref 3.4–5.0)
ALK PHOS: 41 U/L — AB
AST: 34 U/L (ref 15–37)
Anion Gap: 9 (ref 7–16)
BILIRUBIN TOTAL: 0.4 mg/dL (ref 0.2–1.0)
BUN: 20 mg/dL — AB (ref 7–18)
CREATININE: 1.32 mg/dL — AB (ref 0.60–1.30)
Calcium, Total: 8.6 mg/dL (ref 8.5–10.1)
Chloride: 107 mmol/L (ref 98–107)
Co2: 27 mmol/L (ref 21–32)
EGFR (Non-African Amer.): 39 — ABNORMAL LOW
GFR CALC AF AMER: 45 — AB
Glucose: 127 mg/dL — ABNORMAL HIGH (ref 65–99)
OSMOLALITY: 289 (ref 275–301)
Potassium: 4.6 mmol/L (ref 3.5–5.1)
SGPT (ALT): 22 U/L (ref 12–78)
SODIUM: 143 mmol/L (ref 136–145)
TOTAL PROTEIN: 6.1 g/dL — AB (ref 6.4–8.2)

## 2013-09-03 ENCOUNTER — Ambulatory Visit: Payer: Self-pay | Admitting: Oncology

## 2014-01-12 ENCOUNTER — Observation Stay: Payer: Self-pay | Admitting: Internal Medicine

## 2014-01-12 LAB — URINALYSIS, COMPLETE
Bilirubin,UR: NEGATIVE
Blood: NEGATIVE
Glucose,UR: NEGATIVE mg/dL (ref 0–75)
Hyaline Cast: 22
KETONE: NEGATIVE
Leukocyte Esterase: NEGATIVE
Nitrite: NEGATIVE
Ph: 6 (ref 4.5–8.0)
Protein: NEGATIVE
Specific Gravity: 1.013 (ref 1.003–1.030)
Squamous Epithelial: NONE SEEN
WBC UR: 5 /HPF (ref 0–5)

## 2014-01-12 LAB — CBC
HCT: 32.4 % — AB (ref 35.0–47.0)
HGB: 10.5 g/dL — ABNORMAL LOW (ref 12.0–16.0)
MCH: 28.5 pg (ref 26.0–34.0)
MCHC: 32.3 g/dL (ref 32.0–36.0)
MCV: 88 fL (ref 80–100)
PLATELETS: 112 10*3/uL — AB (ref 150–440)
RBC: 3.67 10*6/uL — ABNORMAL LOW (ref 3.80–5.20)
RDW: 13 % (ref 11.5–14.5)
WBC: 8.5 10*3/uL (ref 3.6–11.0)

## 2014-01-12 LAB — COMPREHENSIVE METABOLIC PANEL
ALBUMIN: 3.9 g/dL (ref 3.4–5.0)
ANION GAP: 6 — AB (ref 7–16)
Alkaline Phosphatase: 38 U/L — ABNORMAL LOW
BUN: 29 mg/dL — ABNORMAL HIGH (ref 7–18)
Bilirubin,Total: 0.6 mg/dL (ref 0.2–1.0)
CALCIUM: 9.6 mg/dL (ref 8.5–10.1)
CREATININE: 1.69 mg/dL — AB (ref 0.60–1.30)
Chloride: 98 mmol/L (ref 98–107)
Co2: 33 mmol/L — ABNORMAL HIGH (ref 21–32)
EGFR (African American): 33 — ABNORMAL LOW
EGFR (Non-African Amer.): 29 — ABNORMAL LOW
Glucose: 93 mg/dL (ref 65–99)
OSMOLALITY: 279 (ref 275–301)
POTASSIUM: 3.3 mmol/L — AB (ref 3.5–5.1)
SGOT(AST): 51 U/L — ABNORMAL HIGH (ref 15–37)
SGPT (ALT): 25 U/L
SODIUM: 137 mmol/L (ref 136–145)
TOTAL PROTEIN: 6.6 g/dL (ref 6.4–8.2)

## 2014-01-12 LAB — TROPONIN I: Troponin-I: 0.4 ng/mL — ABNORMAL HIGH

## 2014-01-12 LAB — CK-MB
CK-MB: 4.2 ng/mL — ABNORMAL HIGH (ref 0.5–3.6)
CK-MB: 4.1 ng/mL — ABNORMAL HIGH (ref 0.5–3.6)

## 2014-01-13 DIAGNOSIS — R079 Chest pain, unspecified: Secondary | ICD-10-CM

## 2014-01-13 DIAGNOSIS — R509 Fever, unspecified: Secondary | ICD-10-CM

## 2014-01-13 LAB — COMPREHENSIVE METABOLIC PANEL
ALT: 22 U/L
ANION GAP: 6 — AB (ref 7–16)
Albumin: 3.3 g/dL — ABNORMAL LOW (ref 3.4–5.0)
Alkaline Phosphatase: 35 U/L — ABNORMAL LOW
BUN: 26 mg/dL — AB (ref 7–18)
Bilirubin,Total: 0.4 mg/dL (ref 0.2–1.0)
Calcium, Total: 8.5 mg/dL (ref 8.5–10.1)
Chloride: 102 mmol/L (ref 98–107)
Co2: 33 mmol/L — ABNORMAL HIGH (ref 21–32)
Creatinine: 1.63 mg/dL — ABNORMAL HIGH (ref 0.60–1.30)
EGFR (African American): 35 — ABNORMAL LOW
GFR CALC NON AF AMER: 30 — AB
Glucose: 132 mg/dL — ABNORMAL HIGH (ref 65–99)
Osmolality: 288 (ref 275–301)
Potassium: 3.7 mmol/L (ref 3.5–5.1)
SGOT(AST): 54 U/L — ABNORMAL HIGH (ref 15–37)
SODIUM: 141 mmol/L (ref 136–145)
Total Protein: 5.9 g/dL — ABNORMAL LOW (ref 6.4–8.2)

## 2014-01-13 LAB — LIPID PANEL
Cholesterol: 149 mg/dL (ref 0–200)
HDL Cholesterol: 73 mg/dL — ABNORMAL HIGH (ref 40–60)
Ldl Cholesterol, Calc: 71 mg/dL (ref 0–100)
Triglycerides: 26 mg/dL (ref 0–200)
VLDL Cholesterol, Calc: 5 mg/dL (ref 5–40)

## 2014-01-13 LAB — CLOSTRIDIUM DIFFICILE(ARMC)

## 2014-01-13 LAB — TROPONIN I
TROPONIN-I: 0.5 ng/mL — AB
TROPONIN-I: 0.57 ng/mL — AB

## 2014-01-13 LAB — HEPARIN LEVEL (UNFRACTIONATED): ANTI-XA(UNFRACTIONATED): 0.48 [IU]/mL (ref 0.30–0.70)

## 2014-01-13 LAB — CK-MB: CK-MB: 4.4 ng/mL — ABNORMAL HIGH (ref 0.5–3.6)

## 2014-01-13 LAB — PROTIME-INR
INR: 1.2
Prothrombin Time: 14.8 secs — ABNORMAL HIGH (ref 11.5–14.7)

## 2014-01-13 LAB — APTT: Activated PTT: 27.2 secs (ref 23.6–35.9)

## 2014-01-15 LAB — STOOL CULTURE

## 2014-01-17 LAB — CULTURE, BLOOD (SINGLE)

## 2014-01-19 LAB — CULTURE, BLOOD (SINGLE)

## 2014-02-17 ENCOUNTER — Ambulatory Visit: Payer: Self-pay | Admitting: Oncology

## 2014-02-21 ENCOUNTER — Ambulatory Visit: Payer: Self-pay | Admitting: Oncology

## 2014-02-21 LAB — CBC CANCER CENTER
BASOS PCT: 0.9 %
Basophil #: 0.1 x10 3/mm (ref 0.0–0.1)
EOS PCT: 5.9 %
Eosinophil #: 0.3 x10 3/mm (ref 0.0–0.7)
HCT: 33.1 % — ABNORMAL LOW (ref 35.0–47.0)
HGB: 10.5 g/dL — ABNORMAL LOW (ref 12.0–16.0)
Lymphocyte #: 1.8 x10 3/mm (ref 1.0–3.6)
Lymphocyte %: 30.4 %
MCH: 28.1 pg (ref 26.0–34.0)
MCHC: 31.7 g/dL — AB (ref 32.0–36.0)
MCV: 89 fL (ref 80–100)
Monocyte #: 0.8 x10 3/mm (ref 0.2–0.9)
Monocyte %: 13.3 %
NEUTROS ABS: 2.9 x10 3/mm (ref 1.4–6.5)
NEUTROS PCT: 49.5 %
PLATELETS: 135 x10 3/mm — AB (ref 150–440)
RBC: 3.74 10*6/uL — ABNORMAL LOW (ref 3.80–5.20)
RDW: 13.7 % (ref 11.5–14.5)
WBC: 5.9 x10 3/mm (ref 3.6–11.0)

## 2014-02-21 LAB — COMPREHENSIVE METABOLIC PANEL
ALBUMIN: 3.9 g/dL (ref 3.4–5.0)
ALK PHOS: 38 U/L — AB
ALT: 24 U/L
ANION GAP: 5 — AB (ref 7–16)
BUN: 24 mg/dL — AB (ref 7–18)
Bilirubin,Total: 0.5 mg/dL (ref 0.2–1.0)
CHLORIDE: 101 mmol/L (ref 98–107)
Calcium, Total: 9.1 mg/dL (ref 8.5–10.1)
Co2: 36 mmol/L — ABNORMAL HIGH (ref 21–32)
Creatinine: 1.64 mg/dL — ABNORMAL HIGH (ref 0.60–1.30)
EGFR (African American): 39 — ABNORMAL LOW
EGFR (Non-African Amer.): 32 — ABNORMAL LOW
Glucose: 87 mg/dL (ref 65–99)
Osmolality: 287 (ref 275–301)
Potassium: 3.5 mmol/L (ref 3.5–5.1)
SGOT(AST): 37 U/L (ref 15–37)
Sodium: 142 mmol/L (ref 136–145)
Total Protein: 6.5 g/dL (ref 6.4–8.2)

## 2014-03-06 ENCOUNTER — Ambulatory Visit: Payer: Self-pay | Admitting: Oncology

## 2014-05-23 ENCOUNTER — Ambulatory Visit: Payer: Self-pay | Admitting: Vascular Surgery

## 2014-05-23 LAB — CREATININE, SERUM
Creatinine: 1.78 mg/dL — ABNORMAL HIGH (ref 0.60–1.30)
GFR CALC AF AMER: 36 — AB
GFR CALC NON AF AMER: 29 — AB

## 2014-05-23 LAB — BUN: BUN: 25 mg/dL — AB (ref 7–18)

## 2014-05-31 LAB — COMPREHENSIVE METABOLIC PANEL
ALT: 25 U/L (ref 14–63)
AST: 52 U/L — AB (ref 15–37)
Albumin: 3.7 g/dL (ref 3.4–5.0)
Alkaline Phosphatase: 33 U/L — ABNORMAL LOW (ref 46–116)
Anion Gap: 8 (ref 7–16)
BILIRUBIN TOTAL: 0.4 mg/dL (ref 0.2–1.0)
BUN: 21 mg/dL — ABNORMAL HIGH (ref 7–18)
CALCIUM: 8.8 mg/dL (ref 8.5–10.1)
CO2: 30 mmol/L (ref 21–32)
Chloride: 103 mmol/L (ref 98–107)
Creatinine: 1.49 mg/dL — ABNORMAL HIGH (ref 0.60–1.30)
EGFR (Non-African Amer.): 36 — ABNORMAL LOW
GFR CALC AF AMER: 44 — AB
Glucose: 171 mg/dL — ABNORMAL HIGH (ref 65–99)
Osmolality: 288 (ref 275–301)
Potassium: 3.4 mmol/L — ABNORMAL LOW (ref 3.5–5.1)
SODIUM: 141 mmol/L (ref 136–145)
Total Protein: 6.6 g/dL (ref 6.4–8.2)

## 2014-05-31 LAB — CBC
HCT: 31.8 % — AB (ref 35.0–47.0)
HGB: 10.1 g/dL — AB (ref 12.0–16.0)
MCH: 28.1 pg (ref 26.0–34.0)
MCHC: 31.8 g/dL — ABNORMAL LOW (ref 32.0–36.0)
MCV: 89 fL (ref 80–100)
Platelet: 105 10*3/uL — ABNORMAL LOW (ref 150–440)
RBC: 3.59 10*6/uL — ABNORMAL LOW (ref 3.80–5.20)
RDW: 13 % (ref 11.5–14.5)
WBC: 4.9 10*3/uL (ref 3.6–11.0)

## 2014-05-31 LAB — URINALYSIS, COMPLETE
Bacteria: NONE SEEN
Bilirubin,UR: NEGATIVE
Blood: NEGATIVE
GLUCOSE, UR: NEGATIVE mg/dL (ref 0–75)
Hyaline Cast: 6
Ketone: NEGATIVE
LEUKOCYTE ESTERASE: NEGATIVE
Nitrite: NEGATIVE
Ph: 5 (ref 4.5–8.0)
Protein: NEGATIVE
RBC,UR: 2 /HPF (ref 0–5)
SPECIFIC GRAVITY: 1.01 (ref 1.003–1.030)
WBC UR: <1 /HPF (ref 0–5)

## 2014-05-31 LAB — CK TOTAL AND CKMB (NOT AT ARMC)
CK, TOTAL: 298 U/L — AB (ref 26–192)
CK-MB: 4.6 ng/mL — ABNORMAL HIGH (ref 0.5–3.6)

## 2014-05-31 LAB — PRO B NATRIURETIC PEPTIDE: B-TYPE NATIURETIC PEPTID: 8239 pg/mL — AB (ref 0–450)

## 2014-05-31 LAB — TROPONIN I: Troponin-I: 0.22 ng/mL — ABNORMAL HIGH

## 2014-06-01 LAB — TROPONIN I
TROPONIN-I: 0.34 ng/mL — AB
Troponin-I: 0.29 ng/mL — ABNORMAL HIGH

## 2014-06-01 LAB — CK-MB
CK-MB: 4.6 ng/mL — ABNORMAL HIGH (ref 0.5–3.6)
CK-MB: 6.1 ng/mL — ABNORMAL HIGH (ref 0.5–3.6)
CK-MB: 6.6 ng/mL — ABNORMAL HIGH (ref 0.5–3.6)

## 2014-06-02 ENCOUNTER — Inpatient Hospital Stay: Payer: Self-pay | Admitting: Internal Medicine

## 2014-06-02 LAB — BASIC METABOLIC PANEL
Anion Gap: 10 (ref 7–16)
BUN: 33 mg/dL — AB (ref 7–18)
CHLORIDE: 103 mmol/L (ref 98–107)
CREATININE: 1.74 mg/dL — AB (ref 0.60–1.30)
Calcium, Total: 8.7 mg/dL (ref 8.5–10.1)
Co2: 30 mmol/L (ref 21–32)
EGFR (African American): 36 — ABNORMAL LOW
EGFR (Non-African Amer.): 30 — ABNORMAL LOW
Glucose: 127 mg/dL — ABNORMAL HIGH (ref 65–99)
Osmolality: 294 (ref 275–301)
Potassium: 3.6 mmol/L (ref 3.5–5.1)
SODIUM: 143 mmol/L (ref 136–145)

## 2014-06-02 LAB — DIGOXIN LEVEL: Digoxin: 1.61 ng/mL

## 2014-06-03 LAB — BASIC METABOLIC PANEL
Anion Gap: 8 (ref 7–16)
BUN: 40 mg/dL — ABNORMAL HIGH (ref 7–18)
CALCIUM: 8.5 mg/dL (ref 8.5–10.1)
Chloride: 102 mmol/L (ref 98–107)
Co2: 32 mmol/L (ref 21–32)
Creatinine: 1.77 mg/dL — ABNORMAL HIGH (ref 0.60–1.30)
GFR CALC AF AMER: 36 — AB
GFR CALC NON AF AMER: 29 — AB
GLUCOSE: 114 mg/dL — AB (ref 65–99)
Osmolality: 294 (ref 275–301)
POTASSIUM: 3.5 mmol/L (ref 3.5–5.1)
Sodium: 142 mmol/L (ref 136–145)

## 2014-06-03 LAB — PLATELET COUNT: Platelet: 96 10*3/uL — ABNORMAL LOW (ref 150–440)

## 2014-06-04 LAB — CLOSTRIDIUM DIFFICILE(ARMC)

## 2014-06-06 ENCOUNTER — Non-Acute Institutional Stay (SKILLED_NURSING_FACILITY): Payer: Medicare Other | Admitting: Registered Nurse

## 2014-06-06 ENCOUNTER — Encounter: Payer: Self-pay | Admitting: Registered Nurse

## 2014-06-06 DIAGNOSIS — R7989 Other specified abnormal findings of blood chemistry: Secondary | ICD-10-CM

## 2014-06-06 DIAGNOSIS — R531 Weakness: Secondary | ICD-10-CM

## 2014-06-06 DIAGNOSIS — H409 Unspecified glaucoma: Secondary | ICD-10-CM | POA: Diagnosis not present

## 2014-06-06 DIAGNOSIS — I10 Essential (primary) hypertension: Secondary | ICD-10-CM | POA: Diagnosis not present

## 2014-06-06 DIAGNOSIS — Z8509 Personal history of malignant neoplasm of other digestive organs: Secondary | ICD-10-CM

## 2014-06-06 DIAGNOSIS — L97521 Non-pressure chronic ulcer of other part of left foot limited to breakdown of skin: Secondary | ICD-10-CM

## 2014-06-06 DIAGNOSIS — K529 Noninfective gastroenteritis and colitis, unspecified: Secondary | ICD-10-CM

## 2014-06-06 DIAGNOSIS — E876 Hypokalemia: Secondary | ICD-10-CM

## 2014-06-06 DIAGNOSIS — R778 Other specified abnormalities of plasma proteins: Secondary | ICD-10-CM

## 2014-06-06 DIAGNOSIS — I480 Paroxysmal atrial fibrillation: Secondary | ICD-10-CM

## 2014-06-06 DIAGNOSIS — J4541 Moderate persistent asthma with (acute) exacerbation: Secondary | ICD-10-CM

## 2014-06-06 DIAGNOSIS — I5031 Acute diastolic (congestive) heart failure: Secondary | ICD-10-CM

## 2014-06-06 NOTE — Progress Notes (Signed)
Patient ID: [name], female   DOB: 1933-11-11, 81 y.o.   MRN: [id number]   Place of Service: [facility]  Allergies  Allergen Reactions  . Motrin Pm [Ibuprofen-Diphenhydramine Cit] Other (See Comments)  . Tylenol [Acetaminophen] Other (See Comments)    Code Status: Full Code  Goals of Care: Longevity/STR  Chief Complaint  Patient presents with  . Hospitalization Follow-up    HPI 81 y.o. 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 She is here for short term rehab and her goal is to return home. Seen in room today. No complaints verbalized by patient  Review of Systems Constitutional: Negative for fever and chills. Positive for generalized weakess HENT: Negative for ear pain, congestion, and sore throat Eyes: Negative for eye pain, eye discharge, and visual disturbance  Cardiovascular: Negative for chest pain, palpitations, and leg swelling Respiratory: Negative cough, shortness of breath, and wheezing.  Gastrointestinal: Negative for nausea and vomiting. Negative for abdominal pain and constipation. Positive for chronic diarrhea Genitourinary: Negative for  dysuria, frequency, urgency, and hematuria Endocrine: Negative for polydipsia, polyphagia, and polyuria Musculoskeletal: Negative for back pain, joint pain, and joint swelling  Neurological: Negative for dizziness and headache Skin: Negative for rash Psychiatric: Negative for depression  Past Medical History  Diagnosis Date  . Asthma   . Cancer   . Anxiety   . Hypertension   . Hyperlipidemia   . CVA (cerebral infarction)   . Paroxysmal a-fib     History  Substance Use Topics  . Smoking status: Never Smoker   . Smokeless tobacco: Never Used    . Alcohol Use: Not on file   Family Hx: Significant for HTN     Medication List       This list is accurate as of: 06/06/14  8:13 PM.  Always use your most recent med list.               albuterol 108 (90 BASE) MCG/ACT inhaler  Commonly known as:  PROVENTIL HFA;VENTOLIN HFA  Inhale 2 puffs into the lungs every 4 (four) hours as needed for wheezing or shortness of breath.     aspirin 325 MG tablet  Take 325 mg by mouth daily.     brimonidine 0.15 % ophthalmic solution  Commonly known as:  ALPHAGAN  Place 1 drop into both eyes 2 (two) times daily.     brinzolamide 1 % ophthalmic suspension  Commonly known as:  AZOPT  Place 1 drop into both eyes 2 (two) times daily.     carvedilol 3.125 MG tablet  Commonly known as:  COREG  Take 3.125 mg by mouth 2 (two) times daily with a meal.     diazepam 5 MG tablet  Commonly known as:  VALIUM  Take 5 mg by mouth every 12 (twelve) hours as needed for anxiety.     digoxin 0.125 MG tablet  Commonly known as:  LANOXIN  Take 0.125 mg by mouth every other day.     diphenoxylate-atropine 2.5-0.025 MG per tablet  Commonly known as:  LOMOTIL  Take 2 tablets by mouth 4 (four) times daily.     ergocalciferol 50000 UNITS capsule  Commonly known as:  VITAMIN D2  Take 50,000 Units by mouth once a week.  Fluticasone-Salmeterol 100-50 MCG/DOSE Aepb  Commonly known as:  ADVAIR  Inhale 1 puff into the lungs 2 (two) times daily.     imatinib 400 MG tablet  Commonly known as:  GLEEVEC  Take 400 mg by mouth daily. Take with meals and large glass of water.Caution:Chemotherapy.     latanoprost 0.005 % ophthalmic solution  Commonly known as:  XALATAN  Place 1 drop into both eyes at bedtime.     metolazone 5 MG tablet  Commonly known as:  ZAROXOLYN  Take 5 mg by mouth 2 (two) times a week. 2 times weekly on Tuesday and Fri     montelukast 10 MG tablet  Commonly known as:  SINGULAIR  Take 10 mg by mouth at bedtime.     polycarbophil  625 MG tablet  Commonly known as:  FIBERCON  Take 625 mg by mouth daily.     theophylline 200 MG 12 hr tablet  Commonly known as:  THEODUR  Take 200 mg by mouth 2 (two) times daily.     torsemide 20 MG tablet  Commonly known as:  DEMADEX  Take 20 mg by mouth daily.     traMADol 50 MG tablet  Commonly known as:  ULTRAM  Take 100 mg by mouth 3 (three) times daily as needed for moderate pain or severe pain.        Physical Exam  BP 111/69 mmHg  Pulse 76  Temp(Src) 97.2 F (36.2 C)  Resp 20  Ht 5\' 5"  (1.651 m)  Wt 157 lb (71.215 kg)  BMI 26.13 kg/m2  SpO2 93%  Constitutional: WDWN elderly female in no acute distress. Conversant and pleasant HEENT: Normocephalic and atraumatic. PERRL. EOM intact. No scleral icterus. Oral mucosa moist. Posterior pharynx clear of any exudate or lesions. Teeth and gingiva in good general condition.  Neck: Supple and nontender. No lymphadenopathy, masses, or thyromegaly. No JVD or carotid bruits. Cardiac: Normal S1, S2. Irregularly irregular without appreciable murmurs, rubs, or gallops. Distal pulses intact. Trace pitting dependent edema.  Lungs: No respiratory distress. Breath sounds clear bilaterally without rales, rhonchi, or wheezes. Abdomen: Audible bowel sounds in all quadrants. Soft, nontender, nondistended.  Musculoskeletal: able to move all extremities. Generalized weakness. No joint erythema or tenderness. Skin: Warm and dry. No rash noted. Left great toe ulcer w/o signs of infection.  Neurological: Alert and oriented to person, place, and time. No focal deficits.  Psychiatric: Judgment and insight adequate. Appropriate mood and affect.   Labs Reviewed See discharge summary   Diagnostic Studies Reviewed See discharge summary  Assessment & Plan 1. Asthma with exacerbation, moderate persistent Complete prednisone taper today. Denies any respiratory symptoms. Continue singulair 10mg  daily, albuterol 2 puffs every four hours as needed  for wheezing and shob, advair 100/57mcg 1 puff twice daily, and theophylline 200mg  twice daily. Continue to monitor her status.   2. Essential hypertension Stable. Continue coreg 3.125mg  twice daily, torsemide 20mg  daily, and metolazone 5mg  twice weekly. Continue to monitor   3. Elevated troponin Chronically elevated per discharge summary. Denies chest pain. Patient has no symptoms suggestive of cardiac disease. Monitor clinically  4. Hypokalemia Last documented K 3.4. Will recheck bmp and treat as indicated.   5. Acute diastolic congestive heart failure Appears euvolemic on exam. Continue torsemide 20mg  daily, metolazone 5mg  twice weekly on Tues and Fri, digoxin 121mcg every other day, and coreg 3.125mg  twice daily. Continue daily weight and monitor her status.   6. Paroxysmal a-fib Stable. Rate-controlled. Continue digoxin 126mcg every  other day. Not on anticoagulation due to hx of bleeding.   7. 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 Continue Gleevec 400mg  daily with tramadol 50mg  2 tabs 3 times daily for pain. Is followed by oncology  8. Glaucoma Continue alphagan 0.15% to both eyes twice daily, xalatan 0.005% daily at bedtime, and azopt 1% twice daily. Continue to monitor  9. Chronic diarrhea Continue Lomotil 2.5/0.025mg  2 tabs four times daily and monitor  10. Generalized weakness Continue to work with PT/OT for gait/strength/balance training to restore/maximize functions. Fall risk precautions.   11. Ulcer of great toe, left, limited to breakdown of skin No signs of infections. Continue clean wound with NS, pat dry and apply Aquacel Ag to wound bed. Wrap with 3x3 and secure with Kerlix and tape twice weekly. Continue to monitor   Diagnostic Studies/Labs Ordered: cbc and bmp next lab draw  [place] of care discussed with patient and nursing staff. Patient and nursing staff verbalized understanding  and agree with plan of care. No additional questions or concerns reported.    Arthur Holms, MSN, AGNP-C [facility] [address] [place], [place] [address] [phone] [8am-5pm] After hours: [phone]

## 2014-06-07 ENCOUNTER — Other Ambulatory Visit: Payer: Self-pay | Admitting: *Deleted

## 2014-06-07 LAB — BASIC METABOLIC PANEL
BUN: 43 mg/dL — AB (ref 4–21)
Creatinine: 1.7 mg/dL — AB (ref 0.5–1.1)
GLUCOSE: 147 mg/dL
Potassium: 3.3 mmol/L — AB (ref 3.4–5.3)
SODIUM: 147 mmol/L (ref 137–147)

## 2014-06-07 LAB — CBC AND DIFFERENTIAL
HCT: 34 % — AB (ref 36–46)
Hemoglobin: 10.9 g/dL — AB (ref 12.0–16.0)
PLATELETS: 142 10*3/uL — AB (ref 150–399)
WBC: 6.9 10^3/mL

## 2014-06-07 MED ORDER — DIPHENOXYLATE-ATROPINE 2.5-0.025 MG PO TABS: ORAL_TABLET | ORAL | Status: DC

## 2014-06-07 MED ORDER — DIAZEPAM 5 MG PO TABS: ORAL_TABLET | ORAL | Status: AC

## 2014-06-07 MED ORDER — TRAMADOL HCL 50 MG PO TABS: ORAL_TABLET | ORAL | Status: DC

## 2014-06-07 NOTE — Telephone Encounter (Signed)
[facility] 

## 2014-06-08 ENCOUNTER — Non-Acute Institutional Stay (SKILLED_NURSING_FACILITY): Payer: Medicare Other | Admitting: Internal Medicine

## 2014-06-08 DIAGNOSIS — I480 Paroxysmal atrial fibrillation: Secondary | ICD-10-CM

## 2014-06-08 DIAGNOSIS — R12 Heartburn: Secondary | ICD-10-CM | POA: Diagnosis not present

## 2014-06-08 DIAGNOSIS — I5033 Acute on chronic diastolic (congestive) heart failure: Secondary | ICD-10-CM

## 2014-06-08 DIAGNOSIS — I10 Essential (primary) hypertension: Secondary | ICD-10-CM

## 2014-06-08 DIAGNOSIS — J4541 Moderate persistent asthma with (acute) exacerbation: Secondary | ICD-10-CM | POA: Diagnosis not present

## 2014-06-08 DIAGNOSIS — R5381 Other malaise: Secondary | ICD-10-CM | POA: Diagnosis not present

## 2014-06-08 DIAGNOSIS — D481 Neoplasm of uncertain behavior of connective and other soft tissue: Secondary | ICD-10-CM | POA: Diagnosis not present

## 2014-06-08 DIAGNOSIS — K529 Noninfective gastroenteritis and colitis, unspecified: Secondary | ICD-10-CM | POA: Diagnosis not present

## 2014-06-08 DIAGNOSIS — L89152 Pressure ulcer of sacral region, stage 2: Secondary | ICD-10-CM

## 2014-06-08 DIAGNOSIS — L97529 Non-pressure chronic ulcer of other part of left foot with unspecified severity: Secondary | ICD-10-CM | POA: Diagnosis not present

## 2014-06-08 DIAGNOSIS — L98429 Non-pressure chronic ulcer of back with unspecified severity: Secondary | ICD-10-CM

## 2014-06-08 DIAGNOSIS — C49A0 Gastrointestinal stromal tumor, unspecified site: Secondary | ICD-10-CM

## 2014-06-09 ENCOUNTER — Non-Acute Institutional Stay (SKILLED_NURSING_FACILITY): Payer: Medicare Other | Admitting: Registered Nurse

## 2014-06-09 DIAGNOSIS — IMO0001 Reserved for inherently not codable concepts without codable children: Secondary | ICD-10-CM

## 2014-06-09 DIAGNOSIS — I5033 Acute on chronic diastolic (congestive) heart failure: Secondary | ICD-10-CM | POA: Insufficient documentation

## 2014-06-09 DIAGNOSIS — C49A0 Gastrointestinal stromal tumor, unspecified site: Secondary | ICD-10-CM | POA: Insufficient documentation

## 2014-06-09 DIAGNOSIS — R12 Heartburn: Secondary | ICD-10-CM | POA: Insufficient documentation

## 2014-06-09 DIAGNOSIS — L97509 Non-pressure chronic ulcer of other part of unspecified foot with unspecified severity: Secondary | ICD-10-CM | POA: Insufficient documentation

## 2014-06-09 DIAGNOSIS — R5381 Other malaise: Secondary | ICD-10-CM | POA: Insufficient documentation

## 2014-06-09 DIAGNOSIS — I10 Essential (primary) hypertension: Secondary | ICD-10-CM | POA: Insufficient documentation

## 2014-06-09 DIAGNOSIS — T814XXA Infection following a procedure, initial encounter: Secondary | ICD-10-CM

## 2014-06-09 DIAGNOSIS — I480 Paroxysmal atrial fibrillation: Secondary | ICD-10-CM | POA: Insufficient documentation

## 2014-06-09 DIAGNOSIS — L98429 Non-pressure chronic ulcer of back with unspecified severity: Secondary | ICD-10-CM | POA: Insufficient documentation

## 2014-06-09 DIAGNOSIS — K529 Noninfective gastroenteritis and colitis, unspecified: Secondary | ICD-10-CM | POA: Insufficient documentation

## 2014-06-09 DIAGNOSIS — J45901 Unspecified asthma with (acute) exacerbation: Secondary | ICD-10-CM | POA: Insufficient documentation

## 2014-06-09 NOTE — Progress Notes (Signed)
Patient ID: [name], female   DOB: 1933/06/26, 81 y.o.   MRN: [id number]   Place of Service: [facility]  Allergies  Allergen Reactions  . Motrin Pm [Ibuprofen-Diphenhydramine Cit] Other (See Comments)  . Tylenol [Acetaminophen] Other (See Comments)    Code Status: Full Code  Goals of Care: Longevity/STR  Chief Complaint  Patient presents with  . Acute Visit    foul odor and drainage from groin wound    HPI 81 y.o. 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 Per patient, she had an angiogram done through her Right groin on 05/26/14 prior to her recent hospitalization by Dr. Dian Situ of [facility].  Since then, she has been noticing some intermittent drainage from catheter insertion site but thinks nothing of it. She denies any fever or chill, headache, dizziness, increased shortness of breath, chest pain, or nausea/vomitng. She denies any numbness, tingling, or pain of BLE. ROS otherwise unremarkable.    Past Medical History  Diagnosis Date  . Asthma   . Cancer   . Anxiety   . Hypertension   . Hyperlipidemia   . CVA (cerebral infarction)   . Paroxysmal a-fib     History  Substance Use Topics  . Smoking status: Never Smoker   . Smokeless tobacco: Never Used  . Alcohol Use: Not on file   Family Hx: Significant for HTN     Medication List       This list is accurate as of: 06/09/14 10:16 PM.  Always use your most recent med list.               albuterol 108 (90 BASE) MCG/ACT inhaler  Commonly known as:  PROVENTIL HFA;VENTOLIN HFA  Inhale 2 puffs into the lungs every 4 (four) hours as needed for wheezing or shortness of breath.     aspirin 325 MG tablet  Take 325 mg by mouth daily.     brimonidine 0.15 %  ophthalmic solution  Commonly known as:  ALPHAGAN  Place 1 drop into both eyes 2 (two) times daily.     brinzolamide 1 % ophthalmic suspension  Commonly known as:  AZOPT  Place 1 drop into both eyes 2 (two) times daily.     carvedilol 3.125 MG tablet  Commonly known as:  COREG  Take 3.125 mg by mouth 2 (two) times daily with a meal.     diazepam 5 MG tablet  Commonly known as:  VALIUM  Take one tablet by mouth every 12 hours as needed for anxiety/nervousness.     digoxin 0.125 MG tablet  Commonly known as:  LANOXIN  Take 0.125 mg by mouth every other day.     diphenoxylate-atropine 2.5-0.025 MG per tablet  Commonly known as:  LOMOTIL  Take two tablets by mouth four times daily for diarrhea     ergocalciferol 50000 UNITS capsule  Commonly known as:  VITAMIN D2  Take 50,000 Units by mouth once a week.     Fluticasone-Salmeterol 100-50 MCG/DOSE Aepb  Commonly known as:  ADVAIR  Inhale 1 puff into the lungs 2 (two) times daily.     imatinib 400 MG tablet  Commonly known as:  GLEEVEC  Take 400 mg by mouth daily. Take with meals and large glass of water.Caution:Chemotherapy.  latanoprost 0.005 % ophthalmic solution  Commonly known as:  XALATAN  Place 1 drop into both eyes at bedtime.     metolazone 5 MG tablet  Commonly known as:  ZAROXOLYN  Take 5 mg by mouth 2 (two) times a week. 2 times weekly on Tuesday and Fri     montelukast 10 MG tablet  Commonly known as:  SINGULAIR  Take 10 mg by mouth at bedtime.     polycarbophil 625 MG tablet  Commonly known as:  FIBERCON  Take 625 mg by mouth daily.     theophylline 200 MG 12 hr tablet  Commonly known as:  THEODUR  Take 200 mg by mouth 2 (two) times daily.     torsemide 20 MG tablet  Commonly known as:  DEMADEX  Take 20 mg by mouth daily.     traMADol 50 MG tablet  Commonly known as:  ULTRAM  Take two tablets by mouth three times daily as needed for pain        Physical Exam  BP 116/62 mmHg  Pulse 63   Temp(Src) 98.6 F (37 C)  Resp 20  SpO2 95%  Constitutional: WDWN elderly female in no acute distress. Conversant and pleasant HEENT: Normocephalic and atraumatic. PERRL. EOM intact. No scleral icterus. Oral mucosa moist. Posterior pharynx clear of any exudate or lesions. Teeth and gingiva in good general condition.  Neck: Supple and nontender. No lymphadenopathy, masses, or thyromegaly. No JVD or carotid bruits. Cardiac: Normal S1, S2. Irregularly irregular without appreciable murmurs, rubs, or gallops. Distal pulses intact. Trace pitting dependent edema.  Lungs: No respiratory distress. Breath sounds clear bilaterally without rales, rhonchi, or wheezes. Abdomen: Audible bowel sounds in all quadrants. Soft, nontender, nondistended.  Musculoskeletal: able to move all extremities. Generalized weakness. No joint erythema or tenderness. Skin: Warm and dry. Left great toe ulcer w/o signs of infection. Small deep split between buttocks noted. Right groin has small open wound (~0.5x0.5cm) with moderate of serosanguinous drainage with foul odor and small (~3x4cm) indurated area (?hematoma). Slightly tender to palpation.  Neurological: Alert and oriented to person, place, and time. No focal deficits.  Psychiatric: Judgment and insight adequate. Appropriate mood and affect.   Assessment & Plan 1. Postprocedural wound infection, initial encounter Start doxycyline 100mg  twice daily x 7 days with florastor 250mg  twice daily x 10 days. Will have patient follow up Dr. Dian Situ asap. Continue to monitor her status for now.   Family/Staff Communication Plan of care discussed with patient and nursing staff. Patient and nursing staff verbalized understanding and agree with plan of care. No additional questions or concerns reported.    Arthur Holms, MSN, AGNP-C [facility] [address] [place], [place] [address] [phone] [8am-5pm] After hours: [phone]

## 2014-06-09 NOTE — Progress Notes (Signed)
Patient ID: [name], female   DOB: [birth year], 81 y.o.   MRN: [id number]     Facility: [facility]    PCP: No primary care provider on file.  Code Status: full code  Allergies  Allergen Reactions  . Motrin Pm [Ibuprofen-Diphenhydramine Cit] Other (See Comments)  . Tylenol [Acetaminophen] Other (See Comments)    Chief Complaint  Patient presents with  . New Admit To SNF     HPI:  81 year old patient is here for short term rehabilitation post hospital admission from 05/31/14 to 06/03/14 with asthma exacerbation and acute diastolic CHF. She is seen in her room today. She complaints of nausea and indigestion for 1 day. She does not want to eat with her nausea.Marland Kitchen Her breathing is stable.  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  Review of Systems:  Constitutional: Negative for fever, chills, diaphoresis.  HENT: Negative for headache, congestion, nasal discharge Eyes: Negative for eye pain, blurred vision, double vision and discharge.  Respiratory: Negative for cough, shortness of breath and wheezing.   Cardiovascular: Negative for chest pain, palpitations, leg swelling.  Gastrointestinal: Negative for vomiting, abdominal pain, has chronic diarrhea Genitourinary: Negative for dysuria, flank pain.  Musculoskeletal: Negative for back pain, falls Skin: Negative for itching, rash.  Neurological: Negative for dizziness, tingling, focal weakness. Has generalized weakness Psychiatric/Behavioral: Negative for depression, anxiety, insomnia and memory loss.    Past Medical History  Diagnosis Date  . Asthma   . Cancer   . Anxiety   . Hypertension   . Hyperlipidemia   . CVA (cerebral infarction)   . Paroxysmal a-fib    No past surgical history on file. Social History:   reports that she has never smoked. She has never used  smokeless tobacco. She reports that she does not use illicit drugs. Her alcohol history is not on file.  No family history on file.  Medications: Patient's Medications  New Prescriptions   No medications on file  Previous Medications   ALBUTEROL (PROVENTIL HFA;VENTOLIN HFA) 108 (90 BASE) MCG/ACT INHALER    Inhale 2 puffs into the lungs every 4 (four) hours as needed for wheezing or shortness of breath.   ASPIRIN 325 MG TABLET    Take 325 mg by mouth daily.   BRIMONIDINE (ALPHAGAN) 0.15 % OPHTHALMIC SOLUTION    Place 1 drop into both eyes 2 (two) times daily.   BRINZOLAMIDE (AZOPT) 1 % OPHTHALMIC SUSPENSION    Place 1 drop into both eyes 2 (two) times daily.   CARVEDILOL (COREG) 3.125 MG TABLET    Take 3.125 mg by mouth 2 (two) times daily with a meal.   DIAZEPAM (VALIUM) 5 MG TABLET    Take one tablet by mouth every 12 hours as needed for anxiety/nervousness.   DIGOXIN (LANOXIN) 0.125 MG TABLET    Take 0.125 mg by mouth every other day.   DIPHENOXYLATE-ATROPINE (LOMOTIL) 2.5-0.025 MG PER TABLET    Take two tablets by mouth four times daily for diarrhea   ERGOCALCIFEROL (VITAMIN D2) 50000 UNITS CAPSULE    Take 50,000 Units by mouth once a week.   FLUTICASONE-SALMETEROL (ADVAIR) 100-50 MCG/DOSE AEPB    Inhale 1 puff into the lungs 2 (two) times daily.   IMATINIB (GLEEVEC) 400 MG TABLET    Take 400 mg by mouth daily. Take with meals and large glass of water.Caution:Chemotherapy.  LATANOPROST (XALATAN) 0.005 % OPHTHALMIC SOLUTION    Place 1 drop into both eyes at bedtime.   METOLAZONE (ZAROXOLYN) 5 MG TABLET    Take 5 mg by mouth 2 (two) times a week. 2 times weekly on Tuesday and Fri   MONTELUKAST (SINGULAIR) 10 MG TABLET    Take 10 mg by mouth at bedtime.   POLYCARBOPHIL (FIBERCON) 625 MG TABLET    Take 625 mg by mouth daily.   THEOPHYLLINE (THEODUR) 200 MG 12 HR TABLET    Take 200 mg by mouth 2 (two) times daily.   TORSEMIDE (DEMADEX) 20 MG TABLET    Take 20 mg by mouth daily.   TRAMADOL  (ULTRAM) 50 MG TABLET    Take two tablets by mouth three times daily as needed for pain  Modified Medications   No medications on file  Discontinued Medications   No medications on file     Physical Exam: Filed Vitals:   06/08/14 0834  BP: 110/78  Pulse: 71  Temp: 98.1 F (36.7 C)  Resp: 18    General- elderly female, in no acute distress Head- normocephalic, atraumatic Throat- moist mucus membrane Neck- no cervical lymphadenopathy Cardiovascular- normal s1,s2, no murmurs, irregular rate, intact dorsalis pedis and radial pulses, trace leg edema Respiratory- bilateral clear to auscultation, no wheeze, no rhonchi, no crackles, no use of accessory muscles Abdomen- bowel sounds present, soft, non tender Musculoskeletal- able to move all 4 extremities, ROM at both shoulder restricted, generalized weakness Neurological- no focal deficit Skin- warm and dry, stage 2 pressure ulcer in occyx, left great toe arterial ulcer Psychiatry- alert and oriented to person, place and time, normal mood and affect  Assessment/Plan  Physical deconditioning Will have her work with physical therapy and occupational therapy team to help with gait training and muscle strengthening exercises.fall precautions. Skin care. Encourage to be out of bed.   Asthma with exacerbation To complete her prednisone taper. Continue singulair 10mg  daily, albuterol 2 puffs every four hours as needed for wheezing and shob, advair 100/68mcg 1 puff twice daily, and theophylline 200mg  twice daily. Continue to monitor her status.   Acute diastolic congestive heart failure Breathing stable. Continue torsemide 20mg  daily, metolazone 5mg  twice weekly and coreg 3.125mg  twice daily. Continue daily weight and monitor her status.   Stage 2 pressure ulcer Continue wound care with pressure ulcer prophylaxis, encouraged po intake, to add zinc and vitamin c supplement to assist with wound healing  Heartburn Likely from her  prednisone. Add pantoprazole 40 mg daily for a week and reassess. Add zofran prn for nausea  Ulcer of great toe No signs of infections. Continue wound care  Essential hypertension Stable. Continue coreg 3.125mg  twice daily, torsemide 20mg  daily and metolazone 5mg  twice weekly. Continue to monitor   Paroxysmal a-fib Stable. Rate-controlled. Continue coreg 3.125 mg bid and digoxin 136mcg every other day. 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 Continue Gleevec 400mg  daily with tramadol 50mg  2 tabs 3 times daily for pain. Is followed by oncology  Chronic diarrhea Continue Lomotil 2.5/0.025mg  2 tabs four times daily and monitor   Goals of care: short term rehabilitation   Labs/tests ordered: cbc, bmp  Family/ staff Communication: reviewed care plan with patient and nursing supervisor    Blanchie Serve, MD  [facility] [phone] (Monday-Friday 8 am - 5 pm) [phone] (afterhours)

## 2014-06-14 ENCOUNTER — Other Ambulatory Visit: Payer: Self-pay | Admitting: *Deleted

## 2014-06-14 MED ORDER — DIPHENOXYLATE-ATROPINE 2.5-0.025 MG PO TABS: ORAL_TABLET | ORAL | Status: AC

## 2014-06-14 NOTE — Telephone Encounter (Signed)
[facility] 

## 2014-06-15 LAB — BASIC METABOLIC PANEL: Potassium: 2.7 mmol/L — AB (ref 3.4–5.3)

## 2014-06-16 ENCOUNTER — Non-Acute Institutional Stay (SKILLED_NURSING_FACILITY): Payer: Medicare Other | Admitting: Registered Nurse

## 2014-06-16 ENCOUNTER — Encounter: Payer: Self-pay | Admitting: Registered Nurse

## 2014-06-16 DIAGNOSIS — D481 Neoplasm of uncertain behavior of connective and other soft tissue: Secondary | ICD-10-CM

## 2014-06-16 DIAGNOSIS — K529 Noninfective gastroenteritis and colitis, unspecified: Secondary | ICD-10-CM | POA: Diagnosis not present

## 2014-06-16 DIAGNOSIS — L97529 Non-pressure chronic ulcer of other part of left foot with unspecified severity: Secondary | ICD-10-CM

## 2014-06-16 DIAGNOSIS — L97521 Non-pressure chronic ulcer of other part of left foot limited to breakdown of skin: Secondary | ICD-10-CM

## 2014-06-16 DIAGNOSIS — E876 Hypokalemia: Secondary | ICD-10-CM | POA: Diagnosis not present

## 2014-06-16 DIAGNOSIS — H409 Unspecified glaucoma: Secondary | ICD-10-CM | POA: Diagnosis not present

## 2014-06-16 DIAGNOSIS — I5033 Acute on chronic diastolic (congestive) heart failure: Secondary | ICD-10-CM | POA: Diagnosis not present

## 2014-06-16 DIAGNOSIS — J4541 Moderate persistent asthma with (acute) exacerbation: Secondary | ICD-10-CM | POA: Diagnosis not present

## 2014-06-16 DIAGNOSIS — IMO0001 Reserved for inherently not codable concepts without codable children: Secondary | ICD-10-CM

## 2014-06-16 DIAGNOSIS — T814XXA Infection following a procedure, initial encounter: Secondary | ICD-10-CM

## 2014-06-16 DIAGNOSIS — L89152 Pressure ulcer of sacral region, stage 2: Secondary | ICD-10-CM | POA: Diagnosis not present

## 2014-06-16 DIAGNOSIS — R5381 Other malaise: Secondary | ICD-10-CM

## 2014-06-16 DIAGNOSIS — I480 Paroxysmal atrial fibrillation: Secondary | ICD-10-CM | POA: Diagnosis not present

## 2014-06-16 DIAGNOSIS — I10 Essential (primary) hypertension: Secondary | ICD-10-CM

## 2014-06-16 DIAGNOSIS — L98429 Non-pressure chronic ulcer of back with unspecified severity: Secondary | ICD-10-CM

## 2014-06-16 DIAGNOSIS — C49A0 Gastrointestinal stromal tumor, unspecified site: Secondary | ICD-10-CM

## 2014-06-16 NOTE — Progress Notes (Signed)
Patient ID: [name], female   DOB: 09-08-1933, 81 y.o.   MRN: [id number]   Place of Service: [facility]  Allergies  Allergen Reactions  . Motrin Pm [Ibuprofen-Diphenhydramine Cit] Other (See Comments)  . Tylenol [Acetaminophen] Other (See Comments)    Code Status: Full Code  Goals of Care: Longevity/STR  Chief Complaint  Patient presents with  . Discharge Note    HPI 81 y.o. 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 She was here for short term rehabiliation post hospital admission from 05/31/14 to 06/03/14 with asthma exacerbation and acute diastolic CHF. She has worked well with therapy team and is ready to be discharged home with [facility] PT/OT/RN. Seen in room today. Reported that her groin wound from angiogram is still draining. Denies any other concerns.   Review of Systems Constitutional: Negative for fever and chills. Positive for generalized weakess HENT: Negative for ear pain, congestion, and sore throat Eyes: Negative for eye pain, eye discharge, and visual disturbance  Cardiovascular: Negative for chest pain, palpitations, and leg swelling Respiratory: Negative cough, shortness of breath, and wheezing.  Gastrointestinal: Negative for nausea and vomiting. Negative for abdominal pain and constipation. Positive for chronic diarrhea Genitourinary: Negative for  dysuria, frequency, urgency, and hematuria Endocrine: Negative for polydipsia, polyphagia, and polyuria Musculoskeletal: Negative for back pain, joint pain, and joint swelling  Neurological: Negative for dizziness and headache Skin: Negative for rash. Positive for right Groin open wound and ulcer between buttocks.  Psychiatric: Negative for depression  Past Medical History  Diagnosis Date  . Asthma   . Cancer   . Anxiety   . Hypertension    . Hyperlipidemia   . CVA (cerebral infarction)   . Paroxysmal a-fib     History  Substance Use Topics  . Smoking status: Never Smoker   . Smokeless tobacco: Never Used  . Alcohol Use: Not on file   Family Hx: Significant for HTN     Medication List       This list is accurate as of: 06/16/14 11:59 PM.  Always use your most recent med list.               albuterol 108 (90 BASE) MCG/ACT inhaler  Commonly known as:  PROVENTIL HFA;VENTOLIN HFA  Inhale 2 puffs into the lungs every 4 (four) hours as needed for wheezing or shortness of breath.     aspirin 325 MG tablet  Take 325 mg by mouth daily.     brimonidine 0.15 % ophthalmic solution  Commonly known as:  ALPHAGAN  Place 1 drop into both eyes 2 (two) times daily.     brinzolamide 1 % ophthalmic suspension  Commonly known as:  AZOPT  Place 1 drop into both eyes 2 (two) times daily.     diazepam 5 MG tablet  Commonly known as:  VALIUM  Take one tablet by mouth every 12 hours as needed for anxiety/nervousness.     digoxin 0.125 MG tablet  Commonly known as:  LANOXIN  Take 0.125 mg by mouth every other day.     diphenoxylate-atropine 2.5-0.025 MG per tablet  Commonly known as:  LOMOTIL  Take two tablets by mouth four times daily as needed for diarrhea     ergocalciferol 50000 UNITS capsule  Commonly known as:  VITAMIN D2  Take 50,000 Units by mouth  once a week.     Fluticasone-Salmeterol 100-50 MCG/DOSE Aepb  Commonly known as:  ADVAIR  Inhale 1 puff into the lungs 2 (two) times daily.     imatinib 400 MG tablet  Commonly known as:  GLEEVEC  Take 400 mg by mouth daily. Take with meals and large glass of water.Caution:Chemotherapy.     latanoprost 0.005 % ophthalmic solution  Commonly known as:  XALATAN  Place 1 drop into both eyes at bedtime.     metolazone 5 MG tablet  Commonly known as:  ZAROXOLYN  Take 5 mg by mouth 2 (two) times a week. 2 times weekly on Tuesday and Fri     montelukast 10 MG tablet   Commonly known as:  SINGULAIR  Take 10 mg by mouth at bedtime.     polycarbophil 625 MG tablet  Commonly known as:  FIBERCON  Take 625 mg by mouth daily.     potassium chloride SA 20 MEQ tablet  Commonly known as:  K-DUR,KLOR-CON  Take 40 mEq by mouth daily.     theophylline 200 MG 12 hr tablet  Commonly known as:  THEODUR  Take 200 mg by mouth 2 (two) times daily.     torsemide 20 MG tablet  Commonly known as:  DEMADEX  Take 20 mg by mouth every other day.     traMADol 50 MG tablet  Commonly known as:  ULTRAM  Take two tablets by mouth three times daily as needed for pain        Physical Exam  BP 115/60 mmHg  Pulse 65  Temp(Src) 97.7 F (36.5 C)  Resp 20  SpO2 97%  Constitutional: WDWN elderly female in no acute distress. Conversant and pleasant HEENT: Normocephalic and atraumatic. PERRL. EOM intact. No scleral icterus. Oral mucosa moist. Posterior pharynx clear of any exudate or lesions. Teeth and gingiva in good general condition.  Neck: Supple and nontender. No lymphadenopathy, masses, or thyromegaly. No JVD or carotid bruits. Cardiac: Normal S1, S2. Irregularly irregular without appreciable murmurs, rubs, or gallops. Distal pulses intact. Trace pitting dependent edema.  Lungs: No respiratory distress. Breath sounds clear bilaterally without rales, rhonchi, or wheezes. Abdomen: Audible bowel sounds in all quadrants. Soft, nontender, nondistended.  Musculoskeletal: able to move all extremities. Generalized weakness. No joint erythema or tenderness. Skin: Warm and dry. No rash noted. Left great toe arterial ulcer 0.5x0.5x0.1cm w/o evidence of infection. Wound bed semi-dry, oval shaped with 90% semi-hard pale yellow slough and 10% smooth pink granulation. Periwound unremarkable. Stage 2 sacral ulcer measures 0.6x0.5x0.2cm with 90% granulation and 10% epithelization. Has scant amount of serosanguineous drainage, but w/o evidence of infection. Would edge thickened and  rolled. Periwound has blanchable erythema. Puncture wound from angiogram of Right slightly improved.  Neurological: Alert and oriented to person, place, and time. No focal deficits.  Psychiatric: Judgment and insight adequate. Appropriate mood and affect.   Labs Reviewed CBC    Component Value Date/Time   WBC 6.9 06/07/2014   HGB 10.9* 06/07/2014   HCT 34* 06/07/2014   PLT 142* 06/07/2014   CMP Latest Ref Rng 06/15/2014 06/07/2014  BUN 4 - 21 mg/dL - 43(A)  Creatinine 0.5 - 1.1 mg/dL - 1.7(A)  Sodium 137 - 147 mmol/L - 147  Potassium 3.4 - 5.3 mmol/L 2.7(A) 3.3(A)   06/15/14: Magnesium level: 1.6 06/15/14: Theophylline level 10.7  Assessment & Plan 1. Asthma with exacerbation, moderate persistent Stable. Denies any respiratory symptoms. Continue singulair 10mg  daily, albuterol 2 puffs every four  hours as needed for wheezing and shob, advair 100/42mcg 1 puff twice daily, and theophylline 200mg  twice daily. F/u with PCP  2. Essential hypertension Slightly soft. Coreg 3.125mg  twice daily is currently on hold and torsemide 20mg  daily was recently change to 20mg  EVERY OTHER DAY. Continue holding coreg, torsemide 20mg  every other day, and metolazone 5mg  twice weekly on Tues and Fri. Encourage patient to keep diary of her blood pressure and review it with her PCP. PCP to monitor and make adjustment as necessary  3. Hypokalemia Last K 2.7. Potassium 67mEQ twice daily x 5 days was ordered on 06/15/14. Patient is to continue potassium supplement 40mEQ daily. PCP to check potassium level and adjust treatment as indicated.   5. Acute on chronic diastolic congestive heart failure Appears euvolemic on exam. Continue torsemide 20mg  every other day, metolazone 5mg  twice weekly on Tues and Fri, and digoxin 168mcg every other day. Continue daily weight.   6. Paroxysmal a-fib Stable. Rate-controlled. Continue digoxin 145mcg every other day. Not on anticoagulation due to hx of bleeding.   7. 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 Continue Gleevec 400mg  daily with tramadol 50mg  2 tabs 3 times daily as needed for pain. Is followed by oncology  8. Glaucoma Continue alphagan 0.15% to both eyes twice daily, xalatan 0.005% daily at bedtime, and azopt 1% twice daily.   9. Chronic diarrhea Continue Lomotil 2.5/0.025mg  2 tabs four times daily as needed   10. Physical deconditioning Continue to work with [facility] PT/OT for gait/strength/balance training to restore/maximize functions. Fall risk precautions.   11. Ischemic ulcer of great toe, left, unspecified severity  Will discharge home with RN for wound care. Continue wound care with NS and silver alginate every 3 days. F/u with pcp and vascular  12. Stage 2 sacral pressure ulcer  Stable. Will discharge home with RN for wound care. Continue wound care with NS, hydrogel, and collagen every three days. Instruct patient to keep wound area clean and dry. Continue pressure ulcer precautions  13. Postprocedural wound infection of Right groin Will discharge home with RN for wound care. Continue and complete doxycycline 100mg  twice daily. Will discharge patient with remaining of her abx. F/u with vascular as scheduled.   Home health services:PT/OT/RN DME required:None PCP follow-up: please schedule f/u with PCP w/in 1-2 weeks of discharge from SNF 30-day supply of prescription medications provided (#30 tramadol 50mg )   Family/Staff Communication Plan of care discussed with patient and nursing staff. Patient and nursing staff verbalized understanding and agree with plan of care. No additional questions or concerns reported.    Arthur Holms, MSN, AGNP-C [facility] [address] [place], [place] [address] [phone] [8am-5pm] After hours: [phone]

## 2014-08-26 NOTE — Consult Note (Signed)
Chief Complaint:  Subjective/Chief Complaint Pt doing wellwith reduced cpimproved sob. She ambulated with walker and physical therapist.   VITAL SIGNS/ANCILLARY NOTES: **Vital Signs.:   08-May-14 11:57  Vital Signs Type Routine  Temperature Temperature (F) 98.6  Celsius 37  Temperature Source oral  Pulse Pulse 84  Respirations Respirations 18  Systolic BP Systolic BP 694  Diastolic BP (mmHg) Diastolic BP (mmHg) 49  Mean BP 69  Pulse Ox % Pulse Ox % 98  Pulse Ox Activity Level  At rest  Oxygen Delivery Room Air/ 21 %  *Intake and Output.:   Shift 08-May-14 15:00  Grand Totals Intake:  480 Output:  300    Net:  180 24 Hr.:  180  Oral Intake      In:  480  Urine ml     Out:  300  Length of Stay Totals Intake:  960 Output:  6700    Net:  -5740   Brief Assessment:  Cardiac Regular  murmur present  -- JVD   Respiratory normal resp effort  clear BS   Gastrointestinal Normal   Gastrointestinal details normal Soft   Lab Results: Thyroid:  06-May-14 03:51   Thyroid Stimulating Hormone 0.461 (0.45-4.50 (International Unit)  ----------------------- Pregnant patients have  different reference  ranges for TSH:  - - - - - - - - - -  Pregnant, first trimetser:  0.36 - 2.50 uIU/mL)  LabObservation:  06-May-14 13:27   OBSERVATION Reason for Test  Cardiology:  06-May-14 13:27   Echo Doppler REASON FOR EXAM:     COMMENTS:     PROCEDURE: [facility] - ECHO DOPPLER COMPLETE(TRANSTHOR)  - Sep 08 2012  1:27PM   RESULT: Echocardiogram Report  Patient Name:   [name] Date of Exam: 09/08/2012 Medical Rec #:  [id number]        Custom1: Date of Birth:  02-Dec-1933     Height:       65.0 in Patient Age:    81 years      Weight:       162.0 lb Patient Gender: F             BSA:          1.81 m??  Indications: CHF Sonographer:    Janalee Dane RCS Referring Phys: MODY, SITAL, P  Summary:  1. Left ventricular ejection fraction, by visual estimation, is 45 to  50%.  2. Mildly  decreased global left ventricular systolic function.  3. Normal right ventricular size and systolic function.  4. Mildly dilated left atrium.  5. Mildly dilated right atrium.  6. Degenerative mitral valve.  7. Mild mitral valve regurgitation.  8. Mildly increased left ventricular internal cavity size.  9. Normal aortic valve, without any evidence of aortic stenosis or  insufficiency. 10. The pulmonic valve is trileaflet, without evidence of stenosis or   insufficiency. 11. Mildly elevated pulmonary artery systolic pressure. 12. Mild to moderate tricuspid regurgitation. 13. Thickened tricuspid valve. 2D AND M-MODE MEASUREMENTS (normal ranges within parentheses): Left Ventricle:          Normal IVSd (2D):      1.16 cm (0.7-1.1) LVPWd (2D):     1.07 cm (0.7-1.1) Aorta/LA:                  Normal LVIDd (2D):     5.13 cm (3.4-5.7) Aortic Root (2D): 3.30 cm (2.4-3.7) LVIDs (2D):     3.99 cm  Left Atrium (2D): 4.20 cm (1.9-4.0) LV FS (2D):     22.2 %   (>25%) LV EF (2D):     44.6 %   (>50%)                                   Right Ventricle:                                   RVd (2D):        0.76 cm LV SYSTOLIC FUNCTIONBY 2D PLANIMETRY (MOD): EF-A4C View: 38.4 % EF-A2C View: 45.6 % EF-Biplane: 22.6 % LV DIASTOLIC FUNCTION: MV Peak E: 0.95 m/s E/e' Ratio: 10.90 MV Peak A: 0.83 m/s Decel Time: 201 msec E/A Ratio: 1.14 SPECTRAL DOPPLER ANALYSIS (where applicable): Mitral Valve: MV P1/2 Time: 58.29 msec MV Area, PHT: 3.77 cm?? Aortic Valve: AoV Max Vel: 2.07 m/s AoV Peak PG: 17.1 mmHg AoV Mean PG:  11.0 mmHg LVOT Vmax: 1.21 m/s LVOT VTI: 0.222 m LVOT Diameter: Tricuspid Valve and PA/RV Systolic Pressure: TR Max Velocity: 2.85 m/s RA  Pressure: 10 mmHg RVSP/PASP: 42.5 mmHg PHYSICIAN INTERPRETATION: Left Ventricle: The left ventricular internal cavity size was mildly  increased. LV septal wall thickness was normal. LV posterior wall  thickness was normal. Global LV systolic  function was mildly decreased.  Left ventricular ejection fraction, by visual estimation, is 45 to 50%. Right Ventricle: Normal right ventricular size, wall thickness, and  systolic function. Left Atrium: The left atrium is mildly dilated. Right Atrium: The right atrium is mildly dilated. Pericardium: There is no evidence of pericardial effusion. Mitral Valve: The mitral valve is degenerative in appearance. Mild mitral  valve regurgitation is seen. Tricuspid Valve: The tricuspid valve is thickened. Mild to moderate  tricuspid regurgitation is visualized. The tricuspid regurgitant velocity  is 2.85 m/s, and with an assumed right atrial pressure of 10 mmHg, the   estimated right ventricular systolic pressure is mildly elevated at 42.5  mmHg. Aortic Valve: The aortic valve is trileaflet and structurally normal,  with normal leaflet excursion; without any evidence of aortic stenosis or  insufficiency. The aortic valve is normal. Pulmonic Valve: Structurally normal pulmonic valve, with normal leaflet  excursion.  Parkin MD Electronically signed by [place] Lujean Amel MD Signature Date/Time: 09/08/2012/5:39:53 PM  *** Final ***  IMPRESSION: .  Verified By: Yolonda Kida, M.D., MD  Routine Chem:  06-May-14 03:51   Glucose, Serum  179  BUN 18  Creatinine (comp)  1.38  Sodium, Serum 142  Potassium, Serum 4.1  Chloride, Serum 107  CO2, Serum 31  Calcium (Total), Serum  8.4  Anion Gap  4  Osmolality (calc) 289  eGFR (African American)  43  eGFR (Non-African American)  37 (eGFR values <3m/min/1.73 m2 may be an indication of chronic kidney disease (CKD). Calculated eGFR is useful in patients with stable renal function. The eGFR calculation will not be reliable in acutely ill patients when serum creatinine is changing rapidly. It is not useful in  patients on dialysis. The eGFR calculation may not be applicable to patients at the low and high extremes of body  sizes, pregnant women, and vegetarians.)  Result Comment TROPONIN - RESULTS VERIFIED BY REPEAT TESTING.  - PREVIOUS CALL:09/07/12 AT 1204..TPL  Result(s) reported on 08 Sep 2012 at 05:17AM.  Magnesium, Serum  1.5 (1.8-2.4 THERAPEUTIC  RANGE: 4-7 mg/dL TOXIC: > 10 mg/dL  -----------------------)  Cholesterol, Serum 178  Triglycerides, Serum 31  HDL (INHOUSE)  77  VLDL Cholesterol Calculated 6  LDL Cholesterol Calculated 95 (Result(s) reported on 08 Sep 2012 at 04:45AM.)  07-May-14 04:05   Glucose, Serum  156  BUN  26  Creatinine (comp)  1.46  Sodium, Serum 137  Potassium, Serum 3.7  Chloride, Serum 101  CO2, Serum 28  Calcium (Total), Serum  8.2  Anion Gap 8  Osmolality (calc) 282  eGFR (African American)  40  eGFR (Non-African American)  34 (eGFR values <54m/min/1.73 m2 may be an indication of chronic kidney disease (CKD). Calculated eGFR is useful in patients with stable renal function. The eGFR calculation will not be reliable in acutely ill patients when serum creatinine is changing rapidly. It is not useful in  patients on dialysis. The eGFR calculation may not be applicable to patients at the low and high extremes of body sizes, pregnant women, and vegetarians.)    12:54   Glucose, Serum  138  BUN  27  Creatinine (comp)  1.39  Sodium, Serum  134  Potassium, Serum 3.7  Chloride, Serum 101  CO2, Serum 28  Calcium (Total), Serum  8.2  Anion Gap  5  Osmolality (calc) 276  eGFR (African American)  42  eGFR (Non-African American)  36 (eGFR values <633mmin/1.73 m2 may be an indication of chronic kidney disease (CKD). Calculated eGFR is useful in patients with stable renal function. The eGFR calculation will not be reliable in acutely ill patients when serum creatinine is changing rapidly. It is not useful in  patients on dialysis. The eGFR calculation may not be applicable to patients at the low and high extremes of body sizes, pregnant women, and vegetarians.)   Cardiac:  06-May-14 03:51   CK, Total  462  CPK-MB, Serum  4.5 (Result(s) reported on 08 Sep 2012 at 04:51AM.)  Troponin I  0.09 (0.00-0.05 0.05 ng/mL or less: NEGATIVE  Repeat testing in 3-6 hrs  if clinically indicated. >0.05 ng/mL: POTENTIAL  MYOCARDIAL INJURY. Repeat  testing in 3-6 hrs if  clinically indicated. NOTE: An increase or decrease  of 30% or more on serial  testing suggests a  clinically important change)  Routine Hem:  07-May-14 04:05   Hemoglobin (CBC)  8.1 (Result(s) reported on 09 Sep 2012 at 05:07AM.)   Radiology Results: XRay:    05-May-14 10:23, Chest Portable Single View  Chest Portable Single View   REASON FOR EXAM:    Shortness of Breath  COMMENTS:       PROCEDURE: DXR - DXR PORTABLE CHEST SINGLE VIEW  - Sep 07 2012 10:23AM     RESULT: Comparison: 05/29/2012, 04/09/2011    Findings:  Cardiomegaly and the mediastinum are similar to prior. Mild prominence of   the right superior mediastinum is similar to prior and may be secondary   to tortuous great vessels. There is suggestion of mild interstitial   opacities in the lower lungs. Degenerative changes are seen hours.    IMPRESSION:   Mild interstitial opacities in the lower lungs are nonspecific. These     could be secondary to atelectasis or interstitial edema. Clinical   correlation is suggested. Followup radiograph is suggested.    Dictation site: 2        Verified By: ROGregor HamsM.D., [place]    [id number], Chest PA and Lateral  Chest PA and Lateral   REASON FOR EXAM:  chf?,. sob  COMMENTS:       PROCEDURE: DXR - DXR CHEST PA (OR AP) AND LATERAL  - Sep 08 2012  8:44AM     RESULT: Comparison: 09/07/2012    Findings:  The heart and mediastinum are stable. No focal pulmonary opacities. There   are trace bilateral pleural effusions.    IMPRESSION:   Trace bilateral pleural effusions.    Dictation site: 2    Verified By: Gregor Hams, M.D., MD    [phone] 09:54, Chest  PA and Lateral  Chest PA and Lateral   REASON FOR EXAM:    wheezing, sob, chf, copd  COMMENTS:       PROCEDURE: DXR - DXR CHEST PA (OR AP) AND LATERAL  - Sep 10 2012  9:54AM     RESULT: Comparison is made to the study of [id number]. The cardiac   silhouette is enlarged. There is shallow inspiratory effort without   edema, infiltrate, effusion or pneumothorax. There is minimal blunting in   the posterior gutter which could represent a trace effusion or pleural   thickening. Cardiac monitoring electrodes are demonstrated. Severe   degenerative changes are seen in both shoulders.    IMPRESSION:  Cardiomegaly with possible trace pleural thickening or   pleural effusion in the posterior gutters. Otherwise, no acute   cardiopulmonary disease.  Dictation Site: 2        Verified By: Legrand Rams, M.D., MD  [place]:    05-May-14 15:44, [place] Color Flow Doppler Low Extrem Bilat (Legs)  [place] Color Flow Doppler Low Extrem Bilat (Legs)   REASON FOR EXAM:    edema  COMMENTS:       PROCEDURE: [place]  - US DOPPLER LOW EXTR BILATERAL  - Sep 07 2012  3:44PM     RESULT: Comparison: 05/15/2012    Findings: Multiple longitudinal and transverse gray-scale as well as   color and spectral Doppler images of the bilateral lower extremity veins   were obtained from the common femoral veins through the popliteal veins.    Evaluation for nonocclusive thrombus is limited by soft tissue edema. The   bilateral common femoral, femoral, and popliteal veins are patent,   demonstrating normal color-flow and compressibility. No intraluminal   thrombus is identified.There is normal respiratory variation and     augmentation demonstrated at all vein levels.    IMPRESSION:    No evidence of DVT in the bilateral lower extremities. Evaluation for   nonocclusive thrombus is limited by soft tissue edema.    Dictation site: 2        Verified By: Gregor Hams, M.D., MD  Cardiology:    05-May-14 10:17, ECG  Ventricular  Rate 88  Atrial Rate 88  P-R Interval 136  QRS Duration 88  QT 386  QTc 467  P Axis 72  R Axis -38  T Axis 48  ECG interpretation   Sinus rhythm with marked sinus arrhythmia  Left axis deviation  Abnormal ECG  When compared with ECG of 08-Apr-2011 21:32,  No significant change was found  ----------unconfirmed----------  Confirmed by OVERREAD, NOT ([id number]), editor PEARSON, BARBARA ([id number]) on 09/08/2012 10:43:16 AM  ECG     05-May-14 14:14, ECG  Ventricular Rate 107  Atrial Rate 107  QRS Duration 90  QT 368  QTc 491  R Axis -25  T Axis 43  ECG interpretation   Undetermined rhythm  Low voltage QRS  Borderline ECG  When compared with ECG  of 07-Sep-2012 10:17,  Current undetermined rhythm precludes rhythm comparison, needs review  ----------unconfirmed----------  Confirmed by OVERREAD, NOT ([id number]), editor PEARSON, BARBARA ([id number]) on 09/08/2012 10:44:37 AM  ECG     06-May-14 13:27, Echo Doppler  Echo Doppler   REASON FOR EXAM:      COMMENTS:       PROCEDURE: [facility] - ECHO DOPPLER COMPLETE(TRANSTHOR)  - Sep 08 2012  1:27PM     RESULT: Echocardiogram Report    Patient Name:   [name] Date of Exam: 09/08/2012  Medical Rec #:  [id number]        Custom1:  Date of Birth:  12-12-1933     Height:       65.0 in  Patient Age:    81 years      Weight:       162.0 lb  Patient Gender: F             BSA:          1.81 m??    Indications: CHF  Sonographer:    Janalee Dane RCS  Referring Phys: MODY, SITAL, P    Summary:   1. Left ventricular ejection fraction, by visual estimation, is 45 to   50%.   2. Mildly decreased global left ventricular systolic function.   3. Normal right ventricular size and systolic function.   4. Mildly dilated left atrium.   5. Mildly dilated right atrium.   6. Degenerative mitral valve.   7. Mild mitral valve regurgitation.   8. Mildly increased left ventricular internal cavity size.   9. Normal aortic valve, without any evidence of aortic stenosis or    insufficiency.  10. The pulmonic valve is trileaflet, without evidence of stenosis or     insufficiency.  11. Mildly elevated pulmonary artery systolic pressure.  12. Mild to moderate tricuspid regurgitation.  13. Thickened tricuspid valve.  2D AND M-MODE MEASUREMENTS (normal ranges within parentheses):  Left Ventricle:          Normal  IVSd (2D):      1.16 cm (0.7-1.1)  LVPWd (2D):     1.07 cm (0.7-1.1) Aorta/LA:                  Normal  LVIDd (2D):     5.13 cm (3.4-5.7) Aortic Root (2D): 3.30 cm (2.4-3.7)  LVIDs (2D):     3.99 cm           Left Atrium (2D): 4.20 cm (1.9-4.0)  LV FS (2D):     22.2 %   (>25%)  LV EF (2D):     44.6 %   (>50%)                                    Right Ventricle:                                    RVd (2D):        0.03 cm  LV SYSTOLIC FUNCTIONBY 2D PLANIMETRY (MOD):  EF-A4C View: 38.4 % EF-A2C View: 45.6 % EF-Biplane: 70.4 %  LV DIASTOLIC FUNCTION:  MV Peak E: 0.95 m/s E/e' Ratio: 10.90  MV Peak A: 0.83 m/s Decel Time: 201 msec  E/A Ratio: 1.14  SPECTRAL DOPPLER ANALYSIS (where applicable):  Mitral Valve:  MV P1/2 Time: 58.29 msec  MV  Area, PHT: 3.77 cm??  Aortic Valve: AoV Max Vel: 2.07 m/s AoV Peak PG: 17.1 mmHg AoV Mean PG:   11.0 mmHg  LVOT Vmax: 1.21 m/s LVOT VTI: 0.222 m LVOT Diameter:  Tricuspid Valve and PA/RV Systolic Pressure: TR Max Velocity: 2.85 m/s RA   Pressure: 10 mmHg RVSP/PASP: 42.5 mmHg  PHYSICIAN INTERPRETATION:  Left Ventricle: The left ventricular internal cavity size was mildly   increased. LV septal wall thickness was normal. LV posterior wall   thickness was normal. Global LV systolic function was mildly decreased.   Left ventricular ejection fraction, by visual estimation, is 45 to 50%.  Right Ventricle: Normal right ventricular size, wall thickness, and   systolic function.  Left Atrium: The left atrium is mildly dilated.  Right Atrium: The right atrium is mildly dilated.  Pericardium: There is no evidence of  pericardial effusion.  Mitral Valve: The mitral valve is degenerative in appearance. Mild mitral   valve regurgitation is seen.  Tricuspid Valve: The tricuspid valve is thickened. Mild to moderate   tricuspid regurgitation is visualized. The tricuspid regurgitant velocity   is 2.85 m/s, and with an assumed right atrial pressure of 10 mmHg, the     estimated right ventricular systolic pressure is mildly elevated at 42.5   mmHg.  Aortic Valve: The aortic valve is trileaflet and structurally normal,   with normal leaflet excursion; without any evidence of aortic stenosis or   insufficiency. The aortic valve is normal.  Pulmonic Valve: Structurally normal pulmonic valve, with normal leaflet   excursion.    [place] MD  Electronically signed by [place] MD  Signature Date/Time: 09/08/2012/5:39:53 PM    *** Final ***    IMPRESSION: .    Verified By: Yolonda Kida, M.D., MD   Assessment/Plan:  Invasive Device Daily Assessment of Necessity:  Does the patient currently have any of the following indwelling devices? none   Assessment/Plan:  Assessment IMP CHF CM Edema Astma ARI/CRI RA Demand ischemia Hyperlipidemia HTN .   Plan PLAN Mild hydration Refer to Nepholgy Treat CM Add Hydralazine/Imdur Increase activiy Agree with Coreg Lose dose diuretics Inhalers for asthma Hopefully d/c home soon F/U cardiology as outpt   Electronic Signatures: Lujean Amel D (MD)  (Signed 08-May-14 23:06)  Authored: Chief Complaint, VITAL SIGNS/ANCILLARY NOTES, Brief Assessment, Lab Results, Radiology Results, Assessment/Plan   Last Updated: 08-May-14 23:06 by Lujean Amel D (MD)

## 2014-08-26 NOTE — Consult Note (Signed)
Chief Complaint:  Subjective/Chief Complaint Pt feeling well stable breathing . No cp mild sob edema stable.   VITAL SIGNS/ANCILLARY NOTES: **Vital Signs.:   09-May-14 08:52  Vital Signs Type Routine  Temperature Temperature (F) 98.7  Celsius 37  Pulse Pulse 89  Respirations Respirations 16  Systolic BP Systolic BP 333  Diastolic BP (mmHg) Diastolic BP (mmHg) 64  Mean BP 82  Pulse Ox % Pulse Ox % 99  Pulse Ox Activity Level  At rest  Oxygen Delivery 2L  *Intake and Output.:   Daily 09-May-14 07:00  Grand Totals Intake:  480 Output:  1550    Net:  -1070 24 Hr.:  -1070  Oral Intake      In:  480  Urine ml     Out:  1250  Urine Post Catheter Insertion      Out:  300  Length of Stay Totals Intake:  960 Output:  5456    Net:  -2563   Brief Assessment:  Cardiac Regular  murmur present  -- JVD  --Gallop   Respiratory normal resp effort  clear BS  rhonchi   Gastrointestinal Normal   Gastrointestinal details normal Soft   Lab Results: Thyroid:  06-May-14 03:51   Thyroid Stimulating Hormone 0.461 (0.45-4.50 (International Unit)  ----------------------- Pregnant patients have  different reference  ranges for TSH:  - - - - - - - - - -  Pregnant, first trimetser:  0.36 - 2.50 uIU/mL)  LabObservation:  06-May-14 13:27   OBSERVATION Reason for Test  Hepatic:  05-May-14 11:09   Bilirubin, Total 0.3  Alkaline Phosphatase  32  SGPT (ALT) 17  SGOT (AST)  46  Total Protein, Serum  6.2  Albumin, Serum 3.6  09-May-14 04:24   Bilirubin, Total 0.2  Alkaline Phosphatase  29  SGPT (ALT) 27  SGOT (AST)  40  Total Protein, Serum  5.7  Albumin, Serum  3.1  Cardiology:  05-May-14 10:17   Ventricular Rate 88  Atrial Rate 88  QRS Duration 88  QT 386  QTc 467  R Axis -38  T Axis 48  ECG interpretation Sinus rhythm with marked sinus arrhythmia Left axis deviation Abnormal ECG When compared with ECG of 08-Apr-2011 21:32, No significant change was  found ----------unconfirmed---------- Confirmed by OVERREAD, NOT ([id number]), editor PEARSON, BARBARA ([id number]) on 09/08/2012 10:43:16 AM  P-R Interval 136  P Axis 72    14:14   Ventricular Rate 107  Atrial Rate 107  QRS Duration 90  QT 368  QTc 491  R Axis -25  T Axis 43  ECG interpretation Undetermined rhythm Low voltage QRS Borderline ECG When compared with ECG of 07-Sep-2012 10:17, Current undetermined rhythm precludes rhythm comparison, needs review ----------unconfirmed---------- Confirmed by OVERREAD, NOT ([id number]), editor PEARSON, BARBARA ([id number]) on 09/08/2012 10:44:37 AM  06-May-14 13:27   Echo Doppler REASON FOR EXAM:     COMMENTS:     PROCEDURE: [facility] - ECHO DOPPLER COMPLETE(TRANSTHOR)  - Sep 08 2012  1:27PM   RESULT: Echocardiogram Report  Patient Name:   [name] Date of Exam: 09/08/2012 Medical Rec #:  [id number]        Custom1: Date of Birth:  1933-11-09     Height:       65.0 in Patient Age:    81 years      Weight:       162.0 lb Patient Gender: F             BSA:  1.53 m??  Indications: CHF Sonographer:    Janalee Dane RCS Referring Phys: MODY, SITAL, P  Summary:  1. Left ventricular ejection fraction, by visual estimation, is 45 to  50%.  2. Mildly decreased global left ventricular systolic function.  3. Normal right ventricular size and systolic function.  4. Mildly dilated left atrium.  5. Mildly dilated right atrium.  6. Degenerative mitral valve.  7. Mild mitral valve regurgitation.  8. Mildly increased left ventricular internal cavity size.  9. Normal aortic valve, without any evidence of aortic stenosis or  insufficiency. 10. The pulmonic valve is trileaflet, without evidence of stenosis or   insufficiency. 11. Mildly elevated pulmonary artery systolic pressure. 12. Mild to moderate tricuspid regurgitation. 13. Thickened tricuspid valve. 2D AND M-MODE MEASUREMENTS (normal ranges within parentheses): Left Ventricle:          Normal IVSd (2D):       1.16 cm (0.7-1.1) LVPWd (2D):     1.07 cm (0.7-1.1) Aorta/LA:                  Normal LVIDd (2D):     5.13 cm (3.4-5.7) Aortic Root (2D): 3.30 cm (2.4-3.7) LVIDs (2D):     3.99 cm           Left Atrium (2D): 4.20 cm (1.9-4.0) LV FS (2D):     22.2 %   (>25%) LV EF (2D):     44.6 %   (>50%)                                   Right Ventricle:                                   RVd (2D):        9.50 cm LV SYSTOLIC FUNCTIONBY 2D PLANIMETRY (MOD): EF-A4C View: 38.4 % EF-A2C View: 45.6 % EF-Biplane: 93.2 % LV DIASTOLIC FUNCTION: MV Peak E: 0.95 m/s E/e' Ratio: 10.90 MV Peak A: 0.83 m/s Decel Time: 201 msec E/A Ratio: 1.14 SPECTRAL DOPPLER ANALYSIS (where applicable): Mitral Valve: MV P1/2 Time: 58.29 msec MV Area, PHT: 3.77 cm?? Aortic Valve: AoV Max Vel: 2.07 m/s AoV Peak PG: 17.1 mmHg AoV Mean PG:  11.0 mmHg LVOT Vmax: 1.21 m/s LVOT VTI: 0.222 m LVOT Diameter: Tricuspid Valve and PA/RV Systolic Pressure: TR Max Velocity: 2.85 m/s RA  Pressure: 10 mmHg RVSP/PASP: 42.5 mmHg PHYSICIAN INTERPRETATION: Left Ventricle: The left ventricular internal cavity size was mildly  increased. LV septal wall thickness was normal. LV posterior wall  thickness was normal. Global LV systolic function was mildly decreased.  Left ventricular ejection fraction, by visual estimation, is 45 to 50%. Right Ventricle: Normal right ventricular size, wall thickness, and  systolic function. Left Atrium: The left atrium is mildly dilated. Right Atrium: The right atrium is mildly dilated. Pericardium: There is no evidence of pericardial effusion. Mitral Valve: The mitral valve is degenerative in appearance. Mild mitral  valve regurgitation is seen. Tricuspid Valve: The tricuspid valve is thickened. Mild to moderate  tricuspid regurgitation is visualized. The tricuspid regurgitant velocity  is 2.85 m/s, and with an assumed right atrial pressure of 10 mmHg, the   estimated right ventricular systolic pressure is mildly  elevated at 42.5  mmHg. Aortic Valve: The aortic valve is trileaflet and structurally normal,  with normal leaflet excursion; without any evidence  of aortic stenosis or  insufficiency. The aortic valve is normal. Pulmonic Valve: Structurally normal pulmonic valve, with normal leaflet  excursion.  [place] MD Electronically signed by [place] Lujean Amel MD Signature Date/Time: 09/08/2012/5:39:53 PM  *** Final ***  IMPRESSION: .  Verified By: Yolonda Kida, M.D., MD  Routine Chem:  05-May-14 11:09   Glucose, Serum 75  BUN 15  Creatinine (comp) 1.22  Sodium, Serum  146  Potassium, Serum 3.9  Chloride, Serum  113  CO2, Serum 28  Calcium (Total), Serum 8.7  Osmolality (calc) 290  eGFR (African American)  49  eGFR (Non-African American)  43 (eGFR values <78m/min/1.73 m2 may be an indication of chronic kidney disease (CKD). Calculated eGFR is useful in patients with stable renal function. The eGFR calculation will not be reliable in acutely ill patients when serum creatinine is changing rapidly. It is not useful in  patients on dialysis. The eGFR calculation may not be applicable to patients at the low and high extremes of body sizes, pregnant women, and vegetarians.)  Anion Gap  5  Result Comment TROPONIN  - RESULTS VERIFIED BY REPEAT TESTING.  - NOTIFIED RN KIM GUALT AT [id number]  - 09/07/12 BY V[facility]  - READ-BACK PROCESS PERFORMED.  Result(s) reported on 07 Sep 2012 at 12:05PM.  B-Type Natriuretic Peptide ([facility]  3401 (Result(s) reported on 07 Sep 2012 at 11:56AM.)    19:17   Result Comment TROPONIN - RESULTS VERIFIED BY REPEAT TESTING.  - PREVIOUSLY NOTIFIED AT 1204 ON 09/07/12  - BY VFM  Result(s) reported on 07 Sep 2012 at 08:25PM.  06-May-14 03:51   Magnesium, Serum  1.5 (1.8-2.4 THERAPEUTIC RANGE: 4-7 mg/dL TOXIC: > 10 mg/dL  -----------------------)  Glucose, Serum  179  BUN 18  Creatinine (comp)  1.38  Sodium, Serum 142  Potassium, Serum 4.1   Chloride, Serum 107  CO2, Serum 31  Calcium (Total), Serum  8.4  Osmolality (calc) 289  eGFR (African American)  43  eGFR (Non-African American)  37 (eGFR values <649mmin/1.73 m2 may be an indication of chronic kidney disease (CKD). Calculated eGFR is useful in patients with stable renal function. The eGFR calculation will not be reliable in acutely ill patients when serum creatinine is changing rapidly. It is not useful in  patients on dialysis. The eGFR calculation may not be applicable to patients at the low and high extremes of body sizes, pregnant women, and vegetarians.)  Anion Gap  4  Result Comment TROPONIN - RESULTS VERIFIED BY REPEAT TESTING.  - PREVIOUS CALL:09/07/12 AT 1204..TPL  Result(s) reported on 08 Sep 2012 at 05:17AM.  Cholesterol, Serum 178  Triglycerides, Serum 31  HDL (INHOUSE)  77  VLDL Cholesterol Calculated 6  LDL Cholesterol Calculated 95 (Result(s) reported on 08 Sep 2012 at 04:45AM.)  07-May-14 04:05   Glucose, Serum  156  BUN  26  Creatinine (comp)  1.46  Sodium, Serum 137  Potassium, Serum 3.7  Chloride, Serum 101  CO2, Serum 28  Calcium (Total), Serum  8.2  Osmolality (calc) 282  eGFR (African American)  40  eGFR (Non-African American)  34 (eGFR values <6067min/1.73 m2 may be an indication of chronic kidney disease (CKD). Calculated eGFR is useful in patients with stable renal function. The eGFR calculation will not be reliable in acutely ill patients when serum creatinine is changing rapidly. It is not useful in  patients on dialysis. The eGFR calculation may not be applicable to patients at the low and high extremes  of body sizes, pregnant women, and vegetarians.)  Anion Gap 8    12:54   Glucose, Serum  138  BUN  27  Creatinine (comp)  1.39  Sodium, Serum  134  Potassium, Serum 3.7  Chloride, Serum 101  CO2, Serum 28  Calcium (Total), Serum  8.2  Osmolality (calc) 276  eGFR (African American)  42  eGFR (Non-African American)  36  (eGFR values <70m/min/1.73 m2 may be an indication of chronic kidney disease (CKD). Calculated eGFR is useful in patients with stable renal function. The eGFR calculation will not be reliable in acutely ill patients when serum creatinine is changing rapidly. It is not useful in  patients on dialysis. The eGFR calculation may not be applicable to patients at the low and high extremes of body sizes, pregnant women, and vegetarians.)  Anion Gap  5  08-May-14 13:36   Glucose, Serum  185  BUN  36  Creatinine (comp)  1.47  Sodium, Serum 136  Potassium, Serum  3.2  Chloride, Serum 98  CO2, Serum 30  Calcium (Total), Serum  8.3  Osmolality (calc) 285  eGFR (African American)  39  eGFR (Non-African American)  34 (eGFR values <675mmin/1.73 m2 may be an indication of chronic kidney disease (CKD). Calculated eGFR is useful in patients with stable renal function. The eGFR calculation will not be reliable in acutely ill patients when serum creatinine is changing rapidly. It is not useful in  patients on dialysis. The eGFR calculation may not be applicable to patients at the low and high extremes of body sizes, pregnant women, and vegetarians.)  Anion Gap 8    18:36   Glucose, Serum  199  BUN  37  Creatinine (comp)  1.60  Sodium, Serum  134  Potassium, Serum  3.4  Chloride, Serum  96  CO2, Serum 26  Calcium (Total), Serum  8.0  Osmolality (calc) 283  eGFR (African American)  36  eGFR (Non-African American)  31 (eGFR values <6067min/1.73 m2 may be an indication of chronic kidney disease (CKD). Calculated eGFR is useful in patients with stable renal function. The eGFR calculation will not be reliable in acutely ill patients when serum creatinine is changing rapidly. It is not useful in  patients on dialysis. The eGFR calculation may not be applicable to patients at the low and high extremes of body sizes, pregnant women, and vegetarians.)  Anion Gap 12  Hemoglobin A1c ([facility]) 4.9  (The American Diabetes Association recommends that a primary goal of therapy should be <7% and that physicians should reevaluate the treatment regimen in patients with HbA1c values consistently >8%.)  Result Comment potassium - Slight hemolysis, interpret results with  - caution.  Result(s) reported on 10 Sep 2012 at 07:17PM.  09-May-14 04:24   Iron Binding Capacity (TIBC) 333  Unbound Iron Binding Capacity 318  Iron, Serum  15  Iron Saturation 5 (Result(s) reported on 11 Sep 2012 at 08:27AM.)  Ferritin ([facility] (Result(s) reported on 11 Sep 2012 at 08:34AM.)  Magnesium, Serum 2.1 (1.8-2.4 THERAPEUTIC RANGE: 4-7 mg/dL TOXIC: > 10 mg/dL  -----------------------)  Glucose, Serum  153  BUN  36  Creatinine (comp)  1.32  Sodium, Serum 138  Potassium, Serum  3.3  Chloride, Serum 100  CO2, Serum 30  Calcium (Total), Serum  8.0  Osmolality (calc) 287  eGFR (African American)  45  eGFR (Non-African American)  39 (eGFR values <29m74mn/1.73 m2 may be an indication of chronic kidney disease (CKD). Calculated eGFR is useful  in patients with stable renal function. The eGFR calculation will not be reliable in acutely ill patients when serum creatinine is changing rapidly. It is not useful in  patients on dialysis. The eGFR calculation may not be applicable to patients at the low and high extremes of body sizes, pregnant women, and vegetarians.)  Anion Gap 8  Cardiac:  05-May-14 11:09   CK, Total  499  CPK-MB, Serum  6.4 (Result(s) reported on 07 Sep 2012 at 11:56AM.)  Troponin I  0.13 (0.00-0.05 0.05 ng/mL or less: NEGATIVE  Repeat testing in 3-6 hrs  if clinically indicated. >0.05 ng/mL: POTENTIAL  MYOCARDIAL INJURY. Repeat  testing in 3-6 hrs if  clinically indicated. NOTE: An increase or decrease  of 30% or more on serial  testing suggests a  clinically important change)    19:17   CK, Total  460  CPK-MB, Serum  5.5 (Result(s) reported on 07 Sep 2012 at 08:01PM.)  Troponin  I  0.11 (0.00-0.05 0.05 ng/mL or less: NEGATIVE  Repeat testing in 3-6 hrs  if clinically indicated. >0.05 ng/mL: POTENTIAL  MYOCARDIAL INJURY. Repeat  testing in 3-6 hrs if  clinically indicated. NOTE: An increase or decrease  of 30% or more on serial  testing suggests a  clinically important change)  06-May-14 03:51   CK, Total  462  CPK-MB, Serum  4.5 (Result(s) reported on 08 Sep 2012 at 04:51AM.)  Troponin I  0.09 (0.00-0.05 0.05 ng/mL or less: NEGATIVE  Repeat testing in 3-6 hrs  if clinically indicated. >0.05 ng/mL: POTENTIAL  MYOCARDIAL INJURY. Repeat  testing in 3-6 hrs if  clinically indicated. NOTE: An increase or decrease  of 30% or more on serial  testing suggests a  clinically important change)  Routine UA:  05-May-14 14:29   Color (UA) Yellow  Clarity (UA) Hazy  Glucose (UA) Negative  Bilirubin (UA) Negative  Ketones (UA) Trace  Specific Gravity (UA) 1.018  Blood (UA) Negative  pH (UA) 6.0  Protein (UA) 30 mg/dL  Nitrite (UA) Negative  Leukocyte Esterase (UA) Trace (Result(s) reported on 07 Sep 2012 at 03:39PM.)  RBC (UA) 1 /HPF  WBC (UA) 8 /HPF  Bacteria (UA) TRACE  Epithelial Cells (UA) <1 /HPF  Mucous (UA) PRESENT (Result(s) reported on 07 Sep 2012 at 03:39PM.)  Routine Hem:  05-May-14 11:09   Hemoglobin (CBC)  8.6  WBC (CBC) 6.8  RBC (CBC)  3.15  Hematocrit (CBC)  26.3  Platelet Count (CBC) 170 (Result(s) reported on 07 Sep 2012 at 11:25AM.)  MCV 84  MCH 27.4  MCHC 32.8  RDW 13.5  07-May-14 04:05   Hemoglobin (CBC)  8.1 (Result(s) reported on 09 Sep 2012 at 05:07AM.)  09-May-14 04:24   Hemoglobin (CBC)  7.8 (Result(s) reported on 11 Sep 2012 at 05:05AM.)   Radiology Results: XRay:    05-May-14 10:23, Chest Portable Single View  Chest Portable Single View   REASON FOR EXAM:    Shortness of Breath  COMMENTS:       PROCEDURE: DXR - DXR PORTABLE CHEST SINGLE VIEW  - Sep 07 2012 10:23AM     RESULT: Comparison: 05/29/2012,  04/09/2011    Findings:  Cardiomegaly and the mediastinum are similar to prior. Mild prominence of   the right superior mediastinum is similar to prior and may be secondary   to tortuous great vessels. There is suggestion of mild interstitial   opacities in the lower lungs. Degenerative changes are seen hours.    IMPRESSION:  Mild interstitial opacities in the lower lungs are nonspecific. These     could be secondary to atelectasis or interstitial edema. Clinical   correlation is suggested. Followup radiograph is suggested.    Dictation site: 2        Verified By: Gregor Hams, M.D., MD    06-May-14 08:44, Chest PA and Lateral  Chest PA and Lateral   REASON FOR EXAM:    chf?,. sob  COMMENTS:       PROCEDURE: DXR - DXR CHEST PA (OR AP) AND LATERAL  - Sep 08 2012  8:44AM     RESULT: Comparison: 09/07/2012    Findings:  The heart and mediastinum are stable. No focal pulmonary opacities. There   are trace bilateral pleural effusions.    IMPRESSION:   Trace bilateral pleural effusions.    Dictation site: 2    Verified By: Gregor Hams, M.D., MD    [phone] 09:54, Chest PA and Lateral  Chest PA and Lateral   REASON FOR EXAM:    wheezing, sob, chf, copd  COMMENTS:       PROCEDURE: DXR - DXR CHEST PA (OR AP) AND LATERAL  - Sep 10 2012  9:54AM     RESULT: Comparison is made to the study of [id number]. The cardiac   silhouette is enlarged. There is shallow inspiratory effort without   edema, infiltrate, effusion or pneumothorax. There is minimal blunting in   the posterior gutter which could represent a trace effusion or pleural   thickening. Cardiac monitoring electrodes are demonstrated. Severe   degenerative changes are seen in both shoulders.    IMPRESSION:  Cardiomegaly with possible trace pleural thickening or   pleural effusion in the posterior gutters. Otherwise, no acute   cardiopulmonary disease.  Dictation Site: 2        Verified By: Legrand Rams,  M.D., MD  [place]:    05-May-14 15:44, [place] Color Flow Doppler Low Extrem Bilat (Legs)  [place] Color Flow Doppler Low Extrem Bilat (Legs)   REASON FOR EXAM:    edema  COMMENTS:       PROCEDURE: [place]  - US DOPPLER LOW EXTR BILATERAL  - Sep 07 2012  3:44PM     RESULT: Comparison: 05/15/2012    Findings: Multiple longitudinal and transverse gray-scale as well as   color and spectral Doppler images of the bilateral lower extremity veins   were obtained from the common femoral veins through the popliteal veins.    Evaluation for nonocclusive thrombus is limited by soft tissue edema. The   bilateral common femoral, femoral, and popliteal veins are patent,   demonstrating normal color-flow and compressibility. No intraluminal   thrombus is identified.There is normal respiratory variation and     augmentation demonstrated at all vein levels.    IMPRESSION:    No evidence of DVT in the bilateral lower extremities. Evaluation for   nonocclusive thrombus is limited by soft tissue edema.    Dictation site: 2        Verified By: Gregor Hams, M.D., MD  Cardiology:    05-May-14 10:17, ECG  Ventricular Rate 88  Atrial Rate 88  P-R Interval 136  QRS Duration 88  QT 386  QTc 467  P Axis 72  R Axis -38  T Axis 48  ECG interpretation   Sinus rhythm with marked sinus arrhythmia  Left axis deviation  Abnormal ECG  When compared with ECG of 08-Apr-2011 21:32,  No significant  change was found  ----------unconfirmed----------  Confirmed by OVERREAD, NOT ([id number]), editor PEARSON, BARBARA ([id number]) on 09/08/2012 10:43:16 AM  ECG     05-May-14 14:14, ECG  Ventricular Rate 107  Atrial Rate 107  QRS Duration 90  QT 368  QTc 491  R Axis -25  T Axis 43  ECG interpretation   Undetermined rhythm  Low voltage QRS  Borderline ECG  When compared with ECG of 07-Sep-2012 10:17,  Current undetermined rhythm precludes rhythm comparison, needs review  ----------unconfirmed----------  Confirmed by OVERREAD, NOT  ([id number]), editor PEARSON, BARBARA ([id number]) on 09/08/2012 10:44:37 AM  ECG     06-May-14 13:27, Echo Doppler  Echo Doppler   REASON FOR EXAM:      COMMENTS:       PROCEDURE: [facility] - ECHO DOPPLER COMPLETE(TRANSTHOR)  - Sep 08 2012  1:27PM     RESULT: Echocardiogram Report    Patient Name:   [name] Date of Exam: 09/08/2012  Medical Rec #:  [id number]        Custom1:  Date of Birth:  02/26/1934     Height:       65.0 in  Patient Age:    64 years      Weight:       162.0 lb  Patient Gender: F             BSA:          1.81 m??    Indications: CHF  Sonographer:    Janalee Dane RCS  Referring Phys: MODY, SITAL, P    Summary:   1. Left ventricular ejection fraction, by visual estimation, is 45 to   50%.   2. Mildly decreased global left ventricular systolic function.   3. Normal right ventricular size and systolic function.   4. Mildly dilated left atrium.   5. Mildly dilated right atrium.   6. Degenerative mitral valve.   7. Mild mitral valve regurgitation.   8. Mildly increased left ventricular internal cavity size.   9. Normal aortic valve, without any evidence of aortic stenosis or   insufficiency.  10. The pulmonic valve is trileaflet, without evidence of stenosis or     insufficiency.  11. Mildly elevated pulmonary artery systolic pressure.  12. Mild to moderate tricuspid regurgitation.  13. Thickened tricuspid valve.  2D AND M-MODE MEASUREMENTS (normal ranges within parentheses):  Left Ventricle:          Normal  IVSd (2D):      1.16 cm (0.7-1.1)  LVPWd (2D):     1.07 cm (0.7-1.1) Aorta/LA:                  Normal  LVIDd (2D):     5.13 cm (3.4-5.7) Aortic Root (2D): 3.30 cm (2.4-3.7)  LVIDs (2D):     3.99 cm           Left Atrium (2D): 4.20 cm (1.9-4.0)  LV FS (2D):     22.2 %   (>25%)  LV EF (2D):     44.6 %   (>50%)                                    Right Ventricle:                                    RVd (  2D):        0.25 cm  LV SYSTOLIC FUNCTIONBY 2D PLANIMETRY  (MOD):  EF-A4C View: 38.4 % EF-A2C View: 45.6 % EF-Biplane: 42.7 %  LV DIASTOLIC FUNCTION:  MV Peak E: 0.95 m/s E/e' Ratio: 10.90  MV Peak A: 0.83 m/s Decel Time: 201 msec  E/A Ratio: 1.14  SPECTRAL DOPPLER ANALYSIS (where applicable):  Mitral Valve:  MV P1/2 Time: 58.29 msec  MV Area, PHT: 3.77 cm??  Aortic Valve: AoV Max Vel: 2.07 m/s AoV Peak PG: 17.1 mmHg AoV Mean PG:   11.0 mmHg  LVOT Vmax: 1.21 m/s LVOT VTI: 0.222 m LVOT Diameter:  Tricuspid Valve and PA/RV Systolic Pressure: TR Max Velocity: 2.85 m/s RA   Pressure: 10 mmHg RVSP/PASP: 42.5 mmHg  PHYSICIAN INTERPRETATION:  Left Ventricle: The left ventricular internal cavity size was mildly   increased. LV septal wall thickness was normal. LV posterior wall   thickness was normal. Global LV systolic function was mildly decreased.   Left ventricular ejection fraction, by visual estimation, is 45 to 50%.  Right Ventricle: Normal right ventricular size, wall thickness, and   systolic function.  Left Atrium: The left atrium is mildly dilated.  Right Atrium: The right atrium is mildly dilated.  Pericardium: There is no evidence of pericardial effusion.  Mitral Valve: The mitral valve is degenerative in appearance. Mild mitral   valve regurgitation is seen.  Tricuspid Valve: The tricuspid valve is thickened. Mild to moderate   tricuspid regurgitation is visualized. The tricuspid regurgitant velocity   is 2.85 m/s, and with an assumed right atrial pressure of 10 mmHg, the     estimated right ventricular systolic pressure is mildly elevated at 42.5   mmHg.  Aortic Valve: The aortic valve is trileaflet and structurally normal,   with normal leaflet excursion; without any evidence of aortic stenosis or   insufficiency. The aortic valve is normal.  Pulmonic Valve: Structurally normal pulmonic valve, with normal leaflet   excursion.    [place] MD  Electronically signed by [place] MD  Signature Date/Time:  09/08/2012/5:39:53 PM    *** Final ***    IMPRESSION: .    Verified By: Yolonda Kida, M.D., MD   Assessment/Plan:  Assessment/Plan:  Assessment IMP SOB Asthma CHF CM HTN Demand ischemia Edema Weakness ARI/CRI .   Plan PLAN Medical thepay Inhalers for asthma/copd Continue lasix for CHF/Edema Bp control Renal function better today Increase activity with PT Agree with rehab Consider Hydralazine/Imdur for CHF F/U cardiology 1-2 weeks   Electronic Signatures: Lujean Amel D (MD)  (Signed 09-May-14 11:26)  Authored: Chief Complaint, VITAL SIGNS/ANCILLARY NOTES, Brief Assessment, Lab Results, Radiology Results, Assessment/Plan   Last Updated: 09-May-14 11:26 by Lujean Amel D (MD)

## 2014-08-26 NOTE — Discharge Summary (Signed)
[name], Morris MR#:  [id number] DATE OF BIRTH:  [birth year]  DATE OF ADMISSION:  09/07/2012 DATE OF DISCHARGE:  09/11/2012  ADMITTING DIAGNOSES:  1. Acute respiratory failure.  2. Acute congestive heart failure.  DISCHARGE DIAGNOSES: 1. Acute respiratory failure. 2. Non-Q-wave myocardial infarction type 2 demand ischemia status post cardiac catheterization on 09/09/2012 by Dr. Clayborn Bigness. No significant coronary artery disease noted.  3. Cardiomyopathy with ejection fraction of 40% during the cardiac catheterization. Echocardiogram, however, showed ejection fraction of 45 to 50%.  4. Congestive heart failure, acute on chronic, systolic.  5. Renal insufficiency resolving, improving. 6. Chronic obstructive pulmonary disease, asthma exacerbation.  7. Acute bronchitis.  8. Hyperlipidemia with LDL of 95.  9. Iron deficiency anemia.  10. Atrial fibrillation, in sinus rhythm now.  11. History of stroke, glaucoma, hypertension, rectal carcinoma with metastases to liver on chemotherapy.   DISCHARGE CONDITION: Stable.   DISCHARGE MEDICATIONS: The patient is to resume her outpatient medications which are: 1. Advair Diskus 500/50 one puff twice daily.  2. Align 4 mg once daily.  3. Diltiazem CD 180 mg p.o. once daily. 4. Azopt 1% suspension one drop to each eye twice daily. 5. Alphagan 0.15% solution one drop twice daily. 6. Xalatan 0.005% solution one drop to each eye at bedtime once at bedtime.  7. Oxybutynin 10 mg extended release tablet one tablet once daily.  8. Calcium with vitamin D 600/200 one tablet 3 times daily.  9. Glucosamine and chondroitin with MSM two tablets 3 times daily.  10. Ferrex 150 Forte two capsules once daily at bedtime.  11. Tramadol 50 mg p.o. 3 times daily as needed.  12. Lyrica 75 mg p.o. once daily as needed.  13. Sertraline 25 mg p.o. once daily.  14. New medication: Prednisone taper 50 mg p.o. once on 09/12/2012, then taper by 10 mg daily until  stopped.  15. Atorvastatin 10 mg p.o. at bedtime.  16. Aspirin 81 mg delayed release once daily.  17. Metoprolol tartrate 25 mg p.o. twice daily.  18. Albuterol ipratropium 2.5/0.5 mg in 3 mL inhalation solution, one inhalation 4 times daily as needed.  19. Tiotropium one capsule once daily inhalation.  20. Valium 5 mg p.o. twice daily as needed.  21. Azithromycin 250 mg p.o. once daily for three more days.  22. Multivitamins with minerals once daily.  23. Imatinib 400 mg p.o. once daily.   NOTE: The patient is not to take torsemide or potassium supplement which she was on before for the next 1 to 2 days until recommended by primary care physician. The patient was in the past on 20 mg of torsemide daily to 20 mg twice daily of potassium. Again, she is not to take until recommended by primary care physician. The patient's creatinine should be checked tomorrow on 09/12/2012 and torsemide should be resumed depending on the creatinine level. Home oxygen with portable tank at 2 liters of oxygen per nasal cannula.   DIET: Two gram salt, low fat, low cholesterol, regular consistency.   ACTIVITY: Limitations: As tolerated.   REFERRALS: To Physical Therapy 2 to 7 times a week.   FOLLOWUP: Follow-up appointment is with Dr. Clayborn Bigness within one week after discharge. Also, [facility] in 2 to 3 days after discharge if possible.   CONSULTANTS:  1. Dr. Clayborn Bigness.  2. Social work.   RADIOLOGIC STUDIES: Chest x-ray portable single view 09/07/2012 showed mild interstitial opacities in lower lungs which are nonspecific. This could be secondary to atelectasis  or interstitial edema. Clinical correlation was suggested. Follow-up radiographs were suggested. Repeated chest x-ray PA and lateral 09/08/2012 revealed trace bilateral pleural effusions. Repeated chest x-ray PA and lateral 09/10/2012 revealed cardiomegaly with possible trace pleural thickening or pleural effusion in the posterior gutters.  Otherwise, no acute cardiopulmonary disease according to radiologist. Ultrasound of bilateral lower extremities 09/07/2012 revealed no evidence of DVT in bilateral lower extremities. Evaluation for nonocclusive thrombus was limited by soft tissue edema. Echocardiogram 09/08/2012 revealed left ventricular ejection fraction by visual estimation 45 to 50%, mildly decreased global left ventricular systolic function, normal right ventricular size and systolic function, mildly dilated left atrium, mildly dilated right atrium, degenerative mitral valve, mild mitral valve regurgitation, mildly increased left ventricular internal cavity size, normal aortic valve without any evidence of aortic stenosis or insufficiency, pulmonic valve is trileaflet without evidence of stenosis or insufficiency, mild elevated pulmonary arterial systolic pressure, mild to moderate tricuspid regurgitation, thickened tricuspid valve read by Dr. Clayborn Bigness. Cardiac catheterization done by Dr. Clayborn Bigness on 09/09/2012 according to his postoperative note, the patient had CHF as well as non-Q-wave MI, demand ischemia, as well as renal insufficiency. Postoperative diagnoses are the same, cardiomyopathy, CHF ejection fraction of 40% and mild coronary artery disease.  OPERATION: Cardiac catheterization with bilateral renal shots for renal insufficiency.  PERTINENT INFORMATION: No significant coronary artery disease, moderate cardiomyopathy with ejection fraction of 40%. Recommend medical therapy. Refer to Nephrology. Also due to acute renal failure and chronic renal insufficiency.   HISTORY OF PRESENT ILLNESS: The patient 81 year old African American female with past medical history significant for a history of AFib, stroke, glaucoma, CHF, asthma who presented to the hospital with complaints of shortness of breath. Please refer to Dr. Gardiner Coins admission note on 09/07/2012. On arrival to the hospital, the patient's vitals revealed temperature of 98.3,  pulse (Dictation Anomaly)111, respiratory rate was 20, blood pressure 110/69, saturation was 99% on 2 liters of oxygen via nasal cannula. Physical exam revealed irregularly irregular heart sounds, tachycardic, tightness; however, no significant wheezing or crackles heard on lung exam. The patient also was noted to have lower extremity swelling bilaterally. Her lab on admission on 09/07/2012 revealed beta natriuretic peptide of 3,401, sodium 146, otherwise BMP was unremarkable. The patient's liver enzymes revealed mild elevation of AST to 46. The patient's cardiac enzymes first set showed CK total of 499, MB fraction was 6.4 and troponin was elevated at 0.13. Second set, troponin was 0.11 and the third set, troponin was 0.09 with MB fraction of 5.5 on the second set and 4.5 on the third set. CK total of 460 on the second set and 462 on the third set. TSH was normal at 0.461. CBC: White blood cell count was 6.8, hemoglobin was 8.6 and platelet count was 170. The patient's urinalysis revealed yellow, hazy urine, negative for glucose or bilirubin, trace ketones were noted. Specific gravity was noted to be 1.018, pH was 6, negative for blood, 30 mg per dL protein, negative for nitrites, trace leukocyte esterase, 1 red blood cell, 8 white blood cells, trace bacteria. Less than 1 epithelial cell was noted as well as mucus was present. EKG showed (Dictation Anomaly)initially sinus, later undetermined rhythm at 107 beats per minute, low voltage QRS, borderline EKG. Prior EKG on the same day, 09/07/2012, revealed sinus rhythm with marked sinus arrhythmia, left axis deviation, no significant change since prior EKG done in December 2012. The patient was admitted to the hospital for further evaluation.   HOSPITAL COURSE: She was started on  beta blocker, aspirin, as well as Lovenox and nitroglycerin topically. Her cardiac enzymes were cycled and cardiology consultation was obtained. Cardiologist saw the patient in consultation  09/08/2012. He felt that the patient may benefit from cardiac catheterization. Cardiac catheterization was performed on 09/09/2012. Non-significant mild coronary artery disease was noted as well as cardiomyopathy with ejection fraction of 40%. Postoperatively, patient (Dictation Anomaly)did well. She, however, had mild renal insufficiency (Dictation Anomaly)possibly related to dye on 09/10/2012 when her creatinine went up to 1.6. The patient's diuretics were placed on hold and the patient's kidney function is improving. Today on 09/11/2012, it is 1.32. It is recommended to follow the patient's kidney function very closely and advised the patient to drink plenty of fluids. Follow the patient's creatinine level tomorrow on 09/12/2012 and make decisions about reinstatement of diuretics in next 1 to 2 days after discharge. The patient is to follow up with Dr. Clayborn Bigness in the next few days after discharge for further recommendations.   In regards to cardiomyopathy with ejection fraction of 40%, we were not able to initiate the patient on an ACE inhibitor due to mild renal insufficiency during postoperative post procedure. It is recommended, however, to initiate patient on ACE inhibitor if her blood pressure tolerates and her kidney function improved as outpatient.   In regards to CHF, it was felt to be acute on chronic systolic. The patient was initiated on diuretics initially prior to cardiac catheterization. However, after cardiac catheterization, due to renal insufficiency, the patient's diuretics were placed on hold. It is recommended to restart diuretics as recommended and ACE inhibitor should be restarted shortly after.  In regards to COPD and asthma exacerbation, the patient was treated with antibiotics for acute bronchitis. We were trying to get her sputum cultures; however, we were not able to. However, with steroids IV as well as antibiotic therapy and inhalation therapy, the patient's condition improved  and her asthma improved. Initially, the patient was wheezing significantly. She also very tight lung air entrance on exam. However today, on 09/11/2012, the patient's lung exam is unremarkable, mild diffuse decreased breath sounds was noted; however, no significant wheezing was noted. The patient is to continue prednisone taper as well as antibiotics and inhalation therapy as previously recommended. She is to follow up with her primary physician for further recommendations in regards to asthma management.   For hyperlipidemia, the patient's lipid panel was checked while she was in the hospital. The patient's LDL was found to be 95. The patient's total cholesterol was found to be 178, triglycerides were 31, and the HDL was 77. The patient was initiated on a low dose of Lipitor. It is recommended to follow the patient's lipid panel as outpatient and make decisions about advancement of this medication if needed.   The patient was noted to be anemic on arrival to the hospital. The patient's hemoglobin was low at 8.6 on 09/08/2011. It drifted down to 7.8 by 09/11/2012. There was no noted bleeding; however, iron studies were performed and the patient was noted to be iron deficient with ferritin level of only 12 and iron saturation only of 5. It is recommended to continue the patient's iron supplementation orally. However, if patient is not able to mound good on iron stores, she may need to have iron supplemented daily as well. It is recommended to follow the patient's iron levels as outpatient as well.   In regards to AFib, the patient is to continue Cardizem. She also had metoprolol added for her AFib  management. With this, the patient's heart rate slowed down and she was in intermittent sinus rhythm by the day of discharge with a heart rate between 70s to 90s. On day of discharge, 09/11/2012, the patient's heart rate is 70s to 90s, temperature 98.7, pulse as mentioned above, the respiration rate was 16 to 18,  blood pressure 120/64, saturation 99% on 2 liters of oxygen via nasal cannula, 97% on room air at rest. It is recommended to continue the patient on oxygen therapy as long as she needs to, keeping her O2 sats above 92% at rest as well as on exertion.   TIME SPENT: 40 minutes.  ____________________________ Theodoro Grist, MD rv:es D: 09/11/2012 10:42:00 ET T: 09/11/2012 11:32:10 ET JOB#: [id number]  cc: [facility] Dwayne D. Clayborn Bigness, MD Theodoro Grist MD ELECTRONICALLY SIGNED 09/25/2012 14:52

## 2014-08-26 NOTE — H&P (Signed)
PATIENT NAME:  Sonya, Morris MR#:  [id number] DATE OF BIRTH:  04-Feb-1934  DATE OF ADMISSION:  09/07/2012  PRIMARY CARE PHYSICIAN: [facility].   CHIEF COMPLAINT: Shortness of breath.   HISTORY OF PRESENT ILLNESS: This is a very pleasant 81 year old female with history of atrial fibrillation. She reports history of congestive heart failure and nocturnal hypoxemia requiring O2 at night and rectal cancer who presents with the above complaint. Over the past week, the patient has had progressive shortness of breath, dyspnea on exertion, PND, orthopnea, increasing lower extremity edema and chest tightness. She has been using her nebulizer which does help with her chest tightness and wheezing. However, over the past day it progressed. This morning she was just so weak and was unable to get up so she came to the ER for further evaluation. In the Emergency Room, chest x-ray was performed which did show pulmonary edema and had an elevated BNP. She received 40 IV of Lasix, as well as IV Solu-Medrol.   REVIEW OF SYSTEMS:  CONSTITUTIONAL: No fever. Positive fatigue and weakness. She does have chronic shoulder pain.  EYES: No double or blurred vision.  ENT: No ear pain, hearing loss, seasonal allergies. She does snore at night.  RESPIRATORY: No cough. Positive wheezing. No hemoptysis. Positive dyspnea.  CARDIOVASCULAR: She has chest tightness. No pain. Positive orthopnea and PND. Positive lower extremity edema. Positive atrial fibrillation. Positive dyspnea on exertion. Positive palpitations.  No syncope.  GASTROENTEROLOGY: No nausea, vomiting, diarrhea, abdominal pain, melena, or ulcers.  GENITOURINARY:  No dysuria or hematuria. No decreased urine output.  ENDOCRINE: No polyuria or nocturia. She does have a urinary incontinence.  HEME/LYMPH:  She does have chronic anemia. No easy bruising or bleeding.  SKIN: No rash or lesions.  MUSCULOSKELETAL:  She has shoulder pain which limits her activity  with arthritis.  NEUROLOGIC: She does have a history of CVA.  No TIA, seizures, dementia or dysarthria.  PSYCH:  No history of anxiety or depression.   PAST MEDICAL HISTORY:  1.  Atrial fibrillation.  2.  CVA.  3.  Glaucoma.  4.  Asthma.  5.  Hypertension.  6.  History of rectal cancer with metastasis to the liver. 7.  She says she has a history of congestive heart failure. I do not see any documentation of this or an echocardiogram on her chart.   PAST SURGICAL HISTORY: 1.  Partial resection of her liver.  2.  Back surgery.  3.  Knee surgery.  4.  Right hip replacement.   ALLERGIES: Tylenol due to the fact that she is currently taking oral chemotherapy.   SOCIAL HISTORY: No tobacco, alcohol or drug use.   FAMILY HISTORY: Positive for heart disease.   MEDICATIONS: 1.  Xalatan 0.005% 1 drop to each eye daily.  2.  Valium 5 mg b.i.d. p.r.n.  3.  Tramadol 50 mg t.i.d. p.r.n.  4.  Torsemide 20 mg daily. 5.  Zoloft 25 mg daily.  6.  Potassium chloride 20 milliequivalents b.i.d.  7.  Oxybutynin 10 mg daily.  8.  Lyrica 75 mg daily p.r.n.  9.  Glucosamine 2 tablets t.i.d.  10.  Ferrex 150 mg 2 capsules daily.  11.  Diltiazem 180 mg daily.  12.  Calcium 600 plus D 1 tablet t.i.d.  13.  Azopt 1% suspension 1 drop to each eye b.i.d.  14.  Alphagan 0.15% solution 1 drop to each eye b.i.d.  15.  Align 4 mg daily.  16.  Advair Diskus  500/50 b.i.d.   PHYSICAL EXAMINATION: VITAL SIGNS: Temperature 98.3, pulse 111, respirations 20, blood pressure 110/69, and 99% on 2 liters.  GENERAL: The patient is alert and oriented, not in acute distress.  HEENT: Head is atraumatic. Pupils are round and reactive. Sclerae anicteric. Mucous membranes are dry. Oropharynx is clear.  NECK: Supple without JVD, carotid bruit or enlarged thyroid.  HEART:  Irregular, irregular.  Tachycardic. No appreciable murmurs, gallops, or rubs. Due to fast heartbeat I was unable to appreciate any murmurs.  LUNGS: Fair  air movement.  She is tight however with wheezing and crackles heard at the bases. ABDOMEN:  Bowel sounds are positive.  Nontender and nondistended. No hepatosplenomegaly.  EXTREMITIES:  She has 2+ pitting edema bilaterally and symmetrically.  SKIN: Without rashes or lesions.  NEUROLOGIC: Cranial nerves II through XII are grossly intact without focal deficits.   PERTINENT LABORATORY AND DIAGNOSTICS:  Sodium 146, potassium 3.9, chloride 113, bicarb 28, BUN 15, creatinine 1.22 and glucose 75. BNP 3401.  Calcium 8.7, total protein 6.2, albumin 3.6, bilirubin 0.3, alk phos 32, AST 46 and ALT 17. CK 499, CPK-MB 6.4 and troponin 0.13. White blood cells 6.8, hemoglobin 8.6, hematocrit 26.3 and platelets are 170.   Chest x-ray shows pulmonary edema. EKG shows sinus rhythm with marked sinus arrhythmia. No ST elevations or depressions.   ASSESSMENT AND PLAN:  A 81 year old female with history of atrial fibrillation and asthma who also says she has congestive heart failure who is here with acute respiratory failure. 1.  Acute respiratory failure likely secondary to acute on chronic exacerbation of congestive heart failure as well as asthma exacerbation. Plan as outlined below. 2.  Acute on chronic congestive heart failure. Unknown if this is systolic or diastolic. I will go ahead and order an echocardiogram. She does say that she has congestive heart failure and had congestive heart failure in the past.  She sees Dr. Ubaldo Glassing so will go ahead and consult Dr. Ubaldo Glassing, provide Lasix 40 IV q. 12, monitor ins and outs and daily weights, and place a Foley for monitoring as the patient feels short of breath when she gets up. Due to her bilateral edema, I think it is probably related to congestive heart failure, but will go ahead and order Doppler ultrasounds to rule out a deep venous thrombosis.  3.  Asthma exacerbation. Will order study Solu-Medrol 80 IV q. 8 hours, continue her inhalers, and add nebulizers and oxygen.  4.   Elevated troponin.  Could be non-ST-elevation myocardial infarction or could be demand ischemia from problem #1. I have ordered Lovenox full dose, 170 mg sub-Q every 12 hours, echocardiogram, and Dr. Ubaldo Glassing to see the patient.  5.  Atrial fibrillation.  I have initiated aspirin.  Will continue diltiazem. She is tachycardiac, but will monitor and may need to make adjustments to her medications for better heart rate control. 6.  Glaucoma. We will continue her eye drops.  7.  Depression. Continue Zoloft.  8.  History of rectal cancer, currently on oral chemotherapy. At this time, I do not see any need to consult Dr. Oliva Bustard, but if needed to can consult him.  CODE STATUS: FULL CODE.  TIME SPENT: Approximately 45 minutes.  ___________________________ Donell Beers. Benjie Karvonen, MD spm:sb D: 09/07/2012 14:50:49 ET T: 09/07/2012 15:06:32 ET JOB#: [id number]  cc: Katielynn Horan P. Benjie Karvonen, MD, <Dictator> [facility]. Oliva Bustard, MD Javier Docker Ubaldo Glassing, MD Donell Beers Roverto Bodmer MD ELECTRONICALLY SIGNED 09/07/2012 17:27

## 2014-08-26 NOTE — H&P (Signed)
PATIENT NAMEJUDIA, [name] MR#:  [id number] DATE OF BIRTH:  [birth year]  DATE OF ADMISSION:  09/07/2012 DATE OF DISCHARGE:  09/11/2012  ADMITTING DIAGNOSES:  1. Acute respiratory failure.  2. Acute congestive heart failure.  DISCHARGE DIAGNOSES: 1. Acute respiratory failure. 2. Non-Q-wave myocardial infarction type 2 demand ischemia status post cardiac catheterization on 09/09/2012 by Dr. Clayborn Bigness. No significant coronary artery disease noted.  3. Cardiomyopathy with ejection fraction of 40% during the cardiac catheterization. Echocardiogram, however, showed ejection fraction of 45 to 50%.  4. Congestive heart failure, acute on chronic, systolic.  5. Renal insufficiency resolving, improving. 6. Chronic obstructive pulmonary disease, asthma exacerbation.  7. Acute bronchitis.  8. Hyperlipidemia with LDL of 95.  9. Iron deficiency anemia.  10. Atrial fibrillation, in sinus rhythm now.  11. History of stroke, glaucoma, hypertension, rectal carcinoma with metastases to liver on chemotherapy.   DISCHARGE CONDITION: Stable.   DISCHARGE MEDICATIONS: The patient is to resume her outpatient medications which are: 1. Advair Diskus 500/50 one puff twice daily.  2. Align 4 mg once daily.  3. Diltiazem CD 180 mg p.o. once daily. 4. Azopt 1% suspension one drop to each eye twice daily. 5. Alphagan 0.15% solution one drop twice daily. 6. Xalatan 0.005% solution one drop to each eye at bedtime once at bedtime.  7. Oxybutynin 10 mg extended release tablet one tablet once daily.  8. Calcium with vitamin D 600/200 one tablet 3 times daily.  9. Glucosamine and chondroitin with MSM two tablets 3 times daily.  10. Ferrex 150 Forte two capsules once daily at bedtime.  11. Tramadol 50 mg p.o. 3 times daily as needed.  12. Lyrica 75 mg p.o. once daily as needed.  13. Sertraline 25 mg p.o. once daily.  14. New medication: Prednisone taper 50 mg p.o. once on 09/12/2012, then taper by 10 mg daily until  stopped.  15. Atorvastatin 10 mg p.o. at bedtime.  16. Aspirin 81 mg (Dictation Anomaly)<<MISSING TEXT>> once daily.  17. Metoprolol tartrate 25 mg p.o. twice daily.  18. Albuterol ipratropium 2.5/0.5 mg in 3 mL inhalation solution, one inhalation 4 times daily as needed.  19. Tiotropium one capsule once daily inhalation.  20. Valium 5 mg p.o. twice daily as needed.  21. Azithromycin 250 mg p.o. once daily for three more days.  22. Multivitamins with minerals once daily.  23. (Dictation Anomaly)<<MISSING TEXT>> 400 mg p.o. once daily.   NOTE: The patient is not to take torsemide or potassium supplement which she was on before for the next 1 to 2 days until recommended by primary care physician. The patient was in the past on 20 mg of torsemide daily to 20 mg twice daily of potassium. Again, she is not to take until recommended by primary care physician. The patient's creatinine should be checked tomorrow on 09/12/2012 and torsemide should be resumed depending on the creatinine level. Home oxygen with portable tank at 2 liters of oxygen per nasal cannula.   DIET: Two gram salt, low fat, low cholesterol, regular consistency.   ACTIVITY: Limitations: As tolerated.   REFERRALS: To Physical Therapy 2 to 7 times a week.   FOLLOWUP: Follow-up appointment is with Dr. Clayborn Bigness within one week after discharge. Also, [facility] in 2 to 3 days after discharge if possible.   CONSULTANTS:  1. Dr. Clayborn Bigness.  2. Social work.   RADIOLOGIC STUDIES: Chest x-ray portable single view 09/07/2012 showed mild interstitial opacities in lower lungs which are nonspecific. This could be  secondary to atelectasis or interstitial edema. Clinical correlation was suggested. Follow-up radiographs were suggested. Repeated chest x-ray PA and lateral 09/08/2012 revealed trace bilateral pleural effusions. Repeated chest x-ray PA and lateral 09/10/2012 revealed cardiomegaly with possible trace pleural thickening or  pleural effusion (Dictation Anomaly)<<MISSING TEXT>>. Otherwise, no acute cardiopulmonary disease according to radiologist. Ultrasound of bilateral lower extremities 09/07/2012 revealed no evidence of DVT in bilateral lower extremities. Evaluation for nonocclusive thrombus was limited by soft tissue edema. Echocardiogram 09/08/2012 revealed left ventricular ejection fraction by visual estimation 45 to 50%, mildly decreased global left ventricular systolic function, normal right ventricular size and systolic function, mildly dilated left atrium, mildly dilated right atrium, degenerative mitral valve, mild mitral valve regurgitation, mildly increased left ventricular internal cavity size, normal aortic valve without any evidence of aortic stenosis or insufficiency, pulmonic valve is trileaflet without evidence of stenosis or insufficiency, mild elevated pulmonary arterial systolic pressure, mild to moderate tricuspid regurgitation, thickened tricuspid valve read by Dr. Clayborn Bigness. Cardiac catheterization done by Dr. Clayborn Bigness on 09/09/2012 according to (Dictation Anomaly)<<MISSING TEXT>> postoperative note, the patient had CHF as well as non-Q-wave MI, demand ischemia, as well as renal insufficiency. Postoperative diagnoses are the same, cardiomyopathy, CHF ejection fraction of 40% and mild coronary artery disease.  OPERATION: Cardiac catheterization with bilateral renal shots for renal insufficiency.  PERTINENT INFORMATION: No significant coronary artery disease, moderate cardiomyopathy with ejection fraction of 40%. Recommend medical therapy. Refer to Nephrology. Also due to acute renal failure and chronic renal insufficiency.   HISTORY OF PRESENT ILLNESS: The patient 81 year old African American female with past medical history significant for a history of AFib, stroke, glaucoma, CHF, asthma who presented to the hospital with complaints of shortness of breath. Please refer to Dr. Gardiner Coins admission note on  09/07/2012. On arrival to the hospital, the patient's vitals revealed temperature of 98.3, pulse (Dictation Anomaly)<<MISSING TEXT>>, respiratory rate was 20, blood pressure 110/69, saturation was 99% on 2 liters of oxygen via nasal cannula. Physical exam revealed irregularly irregular heart sounds, tachycardic, tightness; however, no significant wheezing or crackles heard on lung exam. The patient also was noted to have lower extremity swelling bilaterally. Her lab on admission on 09/07/2012 revealed beta natriuretic peptide of 3,401, sodium 146, otherwise BMP was unremarkable. The patient's liver enzymes revealed mild elevation of AST to 46. The patient's cardiac enzymes first set showed CK total of (Dictation Anomaly)<<MISSING TEXT>>, MB fraction was 6.4 and troponin was elevated at 0.13. Second set, troponin was 0.11 and the third set, troponin was 0.09 with MB fraction of 5.5 on the second set and 4.5 on the third set. CK total of 460 on the second set and 462 on the third set. TSH was normal at 0.461. CBC: White blood cell count was 6.8, hemoglobin was 8.6 and platelet count was 170. The patient's urinalysis revealed yellow, hazy urine, negative for glucose or bilirubin, trace ketones were noted. Specific gravity was noted to be 1.018, pH was 6, negative for blood, 30 mg per dL protein, negative for nitrites, trace leukocyte esterase, 1 red blood cell, 8 white blood cells, trace bacteria. Less than 1 epithelial cell was noted as well as mucus was present. EKG showed (Dictation Anomaly)<<MISSING TEXT>> rhythm at 107 beats per minute, low voltage QRS, borderline EKG. Prior EKG on the same day, 09/07/2012, revealed sinus rhythm with marked sinus arrhythmia, left axis deviation, no significant change since prior EKG done in December 2012. The patient was admitted to the hospital for further evaluation.   HOSPITAL  COURSE: She was started on beta blocker, aspirin, as well as Lovenox and nitroglycerin topically.  Her cardiac enzymes were cycled and cardiology consultation was obtained. Cardiologist saw the patient in consultation 09/08/2012. He felt that the patient may benefit from cardiac catheterization. Cardiac catheterization was performed on 09/09/2012. Non-significant mild coronary artery disease was noted as well as cardiomyopathy with ejection fraction of 40%. Postoperatively, patient (Dictation Anomaly)<<MISSING TEXT>> well. She, however, had mild renal insufficiency (Dictation Anomaly)<<MISSING TEXT>> on 09/10/2012 when her creatinine went up to 1.6. The patient's diuretics were placed on hold and the patient's kidney function is improving. Today on 09/11/2012, it is 1.32. It is recommended to follow the patient's kidney function very closely and advised the patient to drink plenty of fluids. Follow the patient's creatinine level tomorrow on 09/12/2012 and make decisions about reinstatement of diuretics in next 1 to 2 days after discharge. The patient is to follow up with Dr. Clayborn Bigness in the next few days after discharge for further recommendations.   In regards to cardiomyopathy with ejection fraction of 40%, we were not able to initiate the patient on an ACE inhibitor due to mild renal insufficiency during postoperative post procedure. It is recommended, however, to initiate patient on ACE inhibitor if her blood pressure tolerates and her kidney function improved as outpatient.   In regards to CHF, it was felt to be acute on chronic systolic. The patient was initiated on diuretics initially prior to cardiac catheterization. However, after cardiac catheterization, due to renal insufficiency, the patient's diuretics were placed on hold. It is recommended to restart diuretics as recommended and ACE inhibitor should be restarted shortly after.  In regards to COPD and asthma exacerbation, the patient was treated with antibiotics for acute bronchitis. We were trying to get her sputum cultures; however, we were  not able to. However, with steroids IV as well as antibiotic therapy and inhalation therapy, the patient's condition improved and her asthma improved. Initially, the patient was wheezing significantly. She also very tight lung air entrance on exam. However today, on 09/11/2012, the patient's lung exam is unremarkable, mild diffuse decreased breath sounds was noted; however, no significant wheezing was noted. The patient is to continue prednisone taper as well as antibiotics and inhalation therapy as previously recommended. She is to follow up with her primary physician for further recommendations in regards to asthma management.   For hyperlipidemia, the patient's lipid panel was checked while she was in the hospital. The patient's LDL was found to be 95. The patient's total cholesterol was found to be 178, triglycerides were 31, and the HDL was 77. The patient was initiated on a low dose of Lipitor. It is recommended to follow the patient's lipid panel as outpatient and make decisions about advancement of this medication if needed.   The patient was noted to be anemic on arrival to the hospital. The patient's hemoglobin was low at 8.6 on 09/08/2011. It drifted down to 7.8 by 09/11/2012. There was no noted bleeding; however, iron studies were performed and the patient was noted to be iron deficient with ferritin level of only 12 and iron saturation only of 5. It is recommended to continue the patient's iron supplementation orally. However, if patient is not able to mound good on iron stores, she may need to have iron supplemented daily as well. It is recommended to follow the patient's iron levels as outpatient as well.   In regards to AFib, the patient is to continue Cardizem. She also had metoprolol  added for her AFib management. With this, the patient's heart rate slowed down and she was in intermittent sinus rhythm by the day of discharge with a heart rate between 70s to 90s. On day of discharge,  09/11/2012, the patient's heart rate is 70s to 90s, temperature 98.7, pulse as mentioned above, the respiration rate was 16 to 18, blood pressure 120/64, saturation 99% on 2 liters of oxygen via nasal cannula, 97% on room air at rest. It is recommended to continue the patient on oxygen therapy as long as she needs to, keeping her O2 sats above 92% at rest as well as on exertion.   TIME SPENT: 40 minutes.    ____________________________ Theodoro Grist, MD rv:es D: 09/11/2012 10:42:03 ET T: 09/11/2012 11:32:10 ET JOB#: [id number]  cc: Theodoro Grist, MD, <Dictator> [facility] Dwayne D. Clayborn Bigness, MD

## 2014-08-26 NOTE — Consult Note (Signed)
PATIENT NAME:  [name], [name] MR#:  [id number] DATE OF BIRTH:  1933/11/08  DATE OF CONSULTATION:  09/08/2012  REFERRING PHYSICIAN:  Dr. Benjie Karvonen  CONSULTING PHYSICIAN:  Dwayne D. Callwood, MD   She is being followed by Dr. Ether Griffins  PRIMARY PHYSICIAN:  [facility]   CARDIOLOGIST:  Dr. Jordan Hawks  INDICATION:  Heart failure, possible non-Q-wave myocardial infarction, elevated troponins.   HISTORY OF PRESENT ILLNESS:  [name] is a 81 year old African-American female with history of atrial fibrillation and stent, with congestive heart failure symptoms, nocturnal hypoxemia requiring O2 at night, and rectal cancer with reported liver involvement. She has been doing reasonably well, saw Dr. Ubaldo Glassing a few days ago, but has had some progressive shortness of breath, dyspnea on exertion, PND, orthopnea, increase in leg swelling, with recurrent chest tightness. She has been using her nebulizer, which does help the chest tightness and wheezing. The patient states it has gotten progressive, and morning of admission she felt weak and was able to get out and walk with her walker, so she came to the Emergency Room. In the ER, chest x-ray suggested possible heart failure. She was treated with Lasix and steroids, and got nebulizers for her asthma, and subsequently was admitted. Initial troponins were elevated, with a troponin of 1.3, BNP was 3401, so she was admitted for further evaluation.   REVIEW OF SYSTEMS:  No blackout spells or syncope. No nausea or vomiting. Denies fever, chills, sweats. No weight loss, no weight gain. No hemoptysis or hematemesis. No bright red blood per rectum. The patient had shortness of breath, congestion, leg edema, angina, bronchospasm.   PAST MEDICAL HISTORY:  Atrial fibrillation, CVA, glaucoma, asthma, hypertension, rectal cancer, liver cancer, edema, chronic back trouble.   PAST SURGICAL HISTORY: Resection for her liver, back surgery, knee surgery, hip replacement.    ALLERGIES:  TYLENOL because of her current chemotherapy.   SOCIAL HISTORY:  Lives at [facility]. No recent smoking or alcohol consumption.   FAMILY HISTORY:  Heart disease.   MEDICATIONS:  Xalatan eyedrops daily, Valium 5 mg p.r.n., tramadol 50, 2 times a day p.r.n., torsemide 20 a day, Zoloft 25 a day, potassium chloride 20 mEq twice a day, oxybutynin 10 mg daily, Lyrica 75 p.r.n., glucosamine 2 tablets 3 times a day, Ferrex 150 mg 2 tablets daily, diltiazem 180 mg a day, calcium 600 Plus D, Azopt 1% suspension eyedrops in each eye twice a day, Alphagan 0.15% solution each eye twice a day, Align 4 mg daily, Advair Diskus 500/50 twice a day.   PHYSICAL EXAMINATION: VITAL SIGNS: Blood pressure 110/69, pulse of 100 and irregular, respiratory rate of 20, afebrile.  HEENT:  Normocephalic, atraumatic. Pupils equal and reactive to light.  NECK:  Supple. No significant JVD, bruits, adenopathy.  LUNGS:  Clear to auscultation and percussion. No significant wheezing, rhonchi or rales.  HEART:  Irregularly regular. A systolic ejection murmur at the apex.  ABDOMEN:  Benign.  EXTREMITY:  Within normal limits, with trace edema.  NEUROLOGIC:  Grossly intact, except for foot drop or weakness in her legs.   LABORATORIES: Sodium 146, potassium 2.9, chloride 113, bicarb 28, BUN 16, creatinine 1.22, glucose 75. BNP 3401, calcium 8.7. LFTs unremarkable. CK was 499, MB 6.4, Troponin 0.13. White count of 6.8, hemoglobin 8.6, hematocrit 26, platelet count 170. Chest x-ray initial   pulmonary edema, mild.  EKG:  Atrial fibrillation, rate of about 100, nonspecific ST-T wave changes.   IMPRESSIONS: 1.  Heart failure, undetermined, probably diastolic,  acute on chronic. 2.  Atrial fibrillation, chronic, slight rapid ventricular response. 3. Elevated troponin, probably demand ischemia from heart failure as well as possible atrial fibrillation, respiratory failure, probably related to heart failure as well as asthma.   4.  Glaucoma.  5.  Depression.  6.  History of rectal cancer.   PLAN:  I agree with admit. Continue to follow troponins. Initial troponin was mild to moderately elevated. This could represent demand ischemia from heart failure as well as possible atrial fibrillation. It is not certain that this was an acute myocardial infarction. Therefore, I recommend diagnosis demand ischemia. She still has anginal symptoms, chest discomfort and shortness of breath, and further evaluation may still be necessary to evaluate for coronary disease. She has had a cardiac cath several years ago, none recently. No functional study recently. She is limited in her activities, which may limit her symptoms, and now that she is short of breath with elevated troponins, further evaluation may be necessary.  Would recommend echocardiogram for assessment of LV function and wall motion. Would also consider, with persistent recurrent symptoms, cardiac cath may be necessary for [place] to definitively evaluate for anginal symptoms. Atrial fibrillation appears to have reasonable rate response with diltiazem. Anticoagulation has not been long-term and useful because of her anemia and bleeding. We may consider short-term anticoagulation and continue rate control. Do not recommend cardioversion or antiarrhythmic at this point. If her heart failure becomes too difficult to control, then would consider rhythm control, as well as she has depressed LV function, and that may be of some benefit.   Respiratory failure/asthma. Continue steroid therapy. Continue inhalers. Consider a short course of antibiotics, in case this represents bronchitis. Again, demand ischemia with borderline troponins, would continue to follow up troponin levels and CK levels. Also, EKG and telemetry should be continued. Continue medications for glaucoma. Ruling out DVT would also be helpful. Continue supplemental oxygen. Continue IV diuretic therapy. Would consider functional  study versus cardiac cath for further cardiac evaluation. Will discuss the case with Dr. Ubaldo Glassing, who knows the patient well, and make final recommendations. I am concerned, with her persistent symptoms of chest pain, chest tightness, shortness of breath, evidence of heart failure and elevated troponin, that invasive evaluation will be necessary to clarify her clinical status. Will pursue echocardiogram and be sure this is only diastolic dysfunction, and that is my guess at this point, and base further recommendations in that regard.  In closing, this probably represents acute on chronic heart failure, probably diastolic dysfunction, probably [place] class III, with extreme limitation with activity, with symptoms at rest and with exertion, probably demand ischemia related to heart failure and/or atrial fibrillation, with borderline elevated troponins. Do not recommend frank myocardial infarction, but with persistent unstable anginal symptoms of recurrent chest pain, would probably recommend invasive evaluation with a cath to rule out significant coronary disease.    ____________________________ Loran Senters. Clayborn Bigness, MD ddc:mr D: 09/08/2012 17:35:00 ET T: 09/08/2012 19:06:07 ET JOB#: [id number]  cc: Dwayne D. Clayborn Bigness, MD, <Dictator> Yolonda Kida MD ELECTRONICALLY SIGNED 10/12/2012 10:46

## 2014-08-27 NOTE — Consult Note (Signed)
PATIENT NAME:  [name], [name] MR#:  [id number] DATE OF BIRTH:  1933-09-15  DATE OF CONSULTATION:  07/25/2013  REFERRING PHYSICIAN:  Dr. Margaretmary Eddy CONSULTING PHYSICIAN:  Corey Skains, MD  REASON FOR CONSULTATION: Acute systolic dysfunction, heart failure, pneumonitis with chronic atrial fibrillation and an old previous stroke.   CHIEF COMPLAINT: "I have had shortness of breath and pain."   HISTORY OF PRESENT ILLNESS: This is a 81 year old female with known previous stroke, atrial fibrillation on appropriate medication management who has had acute onset of chest discomfort, weakness and fatigue with symptoms and signs of pneumonitis. The patient has had an EKG and chest x-ray consistent with acute congestive heart failure. The patient has not had heart failure in the past and is concerned for acute systolic dysfunction, congestive heart failure. The patient has had an old stroke, possibly secondary to atrial fibrillation, and currently stable with an EKG today showing regular rate with left axis deviation and elevated anterior myocardial infarction. She does have anemia with a hemoglobin of 10 and a little elevation of troponin of 0.19 consistent with demand ischemia at this time,  although we will continue serial ECG and enzymes for further assessment. The patient had a BNP of 5396 consistent with possible systolic dysfunction heart failure. Currently, the patient continues to have slight amount of chest discomfort. The remainder of review of systems negative for vision change, ringing in the ears, hearing loss, cough, congestion, heartburn, nausea, vomiting, diarrhea, bloody stools, stomach pain, extremity pain, leg weakness, hypertension and urinary retention, skin lesions or skin rashes.   PAST MEDICAL HISTORY: 1. Old stroke.  2. Atrial fibrillation.  3. Heart failure.   FAMILY HISTORY: No family members with early onset of cardiovascular disease or hypertension.   SOCIAL HISTORY: Currently  denies alcohol or tobacco use.   ALLERGIES: As listed.   MEDICATIONS: As listed.   PHYSICAL EXAMINATION: VITAL SIGNS: Blood pressure 122/68 bilaterally, heart rate 72 upright, reclining, and regular.  GENERAL: She is a well appearing female in no acute distress.  HEENT: No icterus, thyromegaly, ulcers, hemorrhage, or xanthelasma.  CARDIOVASCULAR: Irregularly irregular with normal S1 and S2 with a 2/6 apical murmur consistent with mitral regurgitation. PMI is diffuse. Carotid upstroke normal without bruit. Jugular venous pressure is normal.  LUNGS: Have bibasilar crackles and few decreased breath sounds in the left.  ABDOMEN: Soft, nontender, without hepatosplenomegaly or masses. Abdominal aorta is normal size without bruit.  EXTREMITIES: Shows 2+ bilateral pulses in dorsal, pedal, radial and femoral arteries with a trace lower extremity edema. No cyanosis, clubbing or ulcers.  NEUROLOGIC: She is oriented to time, place, and person, with normal mood and affect.   ASSESSMENT: This is a 81 year old female with old stroke, hypertension, hyperlipidemia with acute possible congestive heart failure with pneumonitis.   RECOMMENDATIONS: 1. Continue treatment with antibiotics and anti-inflammatories for pneumonitis.  2. Lasix for lower extremity and possible pulmonary edema and acute congestive heart failure  3. Beta blocker for heart rate control maintenance of normal sinus rhythm.  4. Echocardiogram for LV systolic dysfunction, valvular heart disease contributing to above.  5. Further diagnostic testing and treatment options after above.  ____________________________ Corey Skains, MD bjk:sg D: 07/25/2013 07:46:18 ET T: 07/25/2013 12:23:58 ET JOB#: [id number]  cc: Corey Skains, MD, <Dictator> Corey Skains MD ELECTRONICALLY SIGNED 07/28/2013 12:31

## 2014-08-27 NOTE — Consult Note (Signed)
   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 Describes one day duration of generalized weakness, She was noted ot have very mild chest discomfort wiht trivial troponin elevation. She had cardiac cath approximately 1 year ago revealing essentially normal coronary arteries. She has improved somewhat since admission. Her pain is better.   Physical Exam:  GEN no acute distress   HEENT PERRL   NECK supple   RESP normal resp effort  clear BS   CARD Irregular rate and rhythm   ABD denies tenderness  normal BS   LYMPH negative neck   SKIN normal to palpation   NEURO cranial nerves intact, motor/sensory function intact   PSYCH A+O to time, place, person   Review of Systems:  Subjective/Chief Complaint fatigue and mild chest discomfort   General: Fatigue   Skin: No Complaints   ENT: No Complaints   Eyes: No Complaints   Neck: No Complaints   Respiratory: No Complaints   Cardiovascular: Chest pain or discomfort   Gastrointestinal: No Complaints   Genitourinary: No Complaints   Vascular: No Complaints   Musculoskeletal: No Complaints   Neurologic: No Complaints   Hematologic: No Complaints   Endocrine: No Complaints   Psychiatric: No Complaints   Review of Systems: All other systems were reviewed and found to be negative   Medications/Allergies Reviewed Medications/Allergies reviewed   EKG:  Interpretation afib with variable ventriuclar reposne    Tylenol: Other  Motrin PM: Other   Impression A 81 year old African American female with past medical history of 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 She has imporved. Troponin elevation does not apppear to be  secondary to ischemia. Recent cath showed unremarkable disease. Would ambulate on current meds and consider discharge iwth outpatinet follow up per D.r Callwood. Does not appear to have had an ami. Mild demand ischemia   Plan 1. Continue current meds 2. No further ischemic or non ischemic cardiaac workup indicated. 3 Consider discharge later this am and follow up with DR. Callwood   Electronic Signatures: Teodoro Spray (MD)  (Signed 10-Sep-15 21:08)  Authored: General Aspect/Present Illness, History and Physical Exam, Review of System, EKG , Allergies, Impression/Plan   Last Updated: 10-Sep-15 21:08 by Teodoro Spray (MD)

## 2014-08-27 NOTE — H&P (Signed)
PATIENT NAME:  [name], [name] MR#:  [id number] DATE OF BIRTH:  1933-05-10  DATE OF ADMISSION:  01/12/2014  REFERRING PHYSICIAN: Elta Guadeloupe R. Jacqualine Code, MD  PRIMARY CARE PHYSICIAN:  Sarajane Jews, MD, of [facility].   CHIEF COMPLAINT:  Weakness.    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 Describes one day duration of generalized weakness, though worse in the left lower extremity without any real associated symptoms. She did describe having some pain in the bilateral lower extremities as well as pain across her shoulder. She was unable to further quantify or clarify what she meant as pain, stating it is only "pain." Intensity 4/10. No worsening or relieving factors. No radiation. She did have some difficulty ambulating today. On EMS arrival, she was noted to be febrile at 102 degrees Fahrenheit.  However, on presentation to the Emergency Department, she was no longer febrile and had no interventions for this.  Really no other symptoms at this time.   REVIEW OF SYSTEMS:   CONSTITUTIONAL: Positive for fatigue, weakness. Denies fever, chills.  EYES: Denies blurred vision, double vision or eye pain.  ENT:  Denies tinnitus, ear pain, or hearing loss.  RESPIRATORY: Denies cough, wheeze, shortness of breath.  CARDIOVASCULAR: Denies chest pain, palpitations, edema.  GASTROINTESTINAL: Denies nausea, vomiting, diarrhea, abdominal pain.  GENITOURINARY: Denies dysuria, hematuria, or change in urinary frequency.  ENDOCRINE: Denies nocturia or thyroid problems.  HEMATOLOGIC AND LYMPHATIC: Denies easy bruising, bleeding.  SKIN: Denies rash or lesions.  MUSCULOSKELETAL: Positive for pain across shoulders, and bilateral lower extremities as described above. Denies pain in neck, back, knees, or hips.  Denies further arthritic symptoms.  NEUROLOGIC: Denies paralysis, paresthesias.  PSYCHIATRIC: Denies anxiety or depressive symptoms. Otherwise, full review of systems performed by me is negative.   PAST MEDICAL HISTORY: Hyperlipidemia, atrial fibrillation, history of CVA, gastrointestinal stromal tumor with known metastases to liver and lung.   SOCIAL HISTORY: Denies any current alcohol, tobacco, or drug use. Currently resides at [facility] assisted living. Uses a walker for ambulation.   FAMILY HISTORY: Positive for hypertension.   ALLERGIES: MOTRIN AS WELL AS TYLENOL SECONDARY TO KIDNEY AND LIVER DYSFUNCTION.   HOME MEDICATIONS: Include prednisone 10 mg p.o. every other day, tramadol 50 mg p.o. 3 times daily as needed for pain, digoxin 125 mcg p.o. q. daily, Gleevec 400 mg p.o. q. daily, Advair 500/50 mcg inhalation 1 puff  b.i.d., torsemide 20 mg p.o. q.  daily, Ferrex 150 mg 2 capsules daily, Singulair 10 mg p.o. q. daily, magnesium oxide 100 mg p.o. q. daily, potassium unknown dose but daily, glucosamine/chondroitin 400/500/250, 2 tablets p.o. 3 times daily, Alphagan 0.15% ophthalmic solution 1 drop to each eye 3 times daily, Azopt 1% ophthalmic solution 1 drop to each eye twice daily, Xalatan 0.005% ophthalmic solution 1 drop to each eye at bedtime, probiotics once daily, oxybutynin 10 mg p.o. daily, calcium 600 + D 200, one tab p.o. 3 times daily, vitamin D2 50,000 International Units q. monthly.  PHYSICAL EXAMINATION: VITAL SIGNS: Temperature 99.3, heart rate 94, respirations 18, blood pressure 132/72, saturating 98% on room air. Weight 86.5 kg, BMI 31.7.  GENERAL: Well-nourished, well-developed, African American female, currently in no acute distress.  HEAD: Normocephalic, atraumatic.  EYES: Pupils equal, round and reactive to light. Extraocular muscles intact. No scleral icterus.  MOUTH:  Moist mucous membranes. Dentition intact. No abscess noted.  EARS, NOSE, THROAT: Clear, without  exudates. No external lesions. NECK: Supple. No thyromegaly. No nodules. No JVD.  PULMONARY: Clear to auscultation bilaterally with no wheezes, rubs, or rhonchi. No use of accessory muscles. Good respiratory effort.  CHEST: Nontender to palpation. CARDIOVASCULAR: S1, S2, irregular rate, irregular rhythm. No murmurs, rubs, or gallops. Edema 1+ bilateral lower extremities to shins.  Pedal pulse 2+ bilaterally.  GASTROINTESTINAL: Soft, nontender, nondistended, no masses. Positive bowel sounds. No hepatosplenomegaly.  MUSCULOSKELETAL: No swelling, clubbing, or edema. Range of motion full in all extremities.  NEUROLOGIC: Cranial nerves II through XII intact. No gross focal neurological deficits. Sensation intact. Reflexes intact.  SKIN: No  ulceration, lesion, rash, or cyanosis. Skin warm and dry. Turgor intact.  PSYCHIATRIC: Mood and affect within normal limits. The patient awake, alert, and oriented x 3. Insight and judgment intact.   LABORATORY DATA: She had a CT head performed. It reveals no acute intracranial process. Chest x-ray performed reveals no acute cardiopulmonary process. Remainder of laboratory data: Sodium 137, potassium 3.3, chloride 98, bicarbonate 33, BUN 29, creatinine 1.69 glucose 93. LFTs: AST of 51 otherwise within normal limits. Troponin I of 0.4. WBC 8.5, hemoglobin 10.5, platelets of 112,000. Urinalysis: WBCs 5, RBCs 1, bacteria 3+, epithelial cells none; however, nitrite and leukocyte esterase both negative.   ASSESSMENT AND PLAN: A 81 year old [place] American female with a history of stage IV gastrointestinal stromal tumor with known metastases to the liver and lung, presenting with generalized weakness, as well as supposedly having fever at home; however, no longer febrile. 1.  Weakness, more pronounced than baseline according to family who are at bedside. Possible from urinary tract infection and will start antibiotic coverage with ceftriaxone, as well will get a physical  therapy evaluation.  2.  Urinary tract infection.  Ceftriaxone as above.  3.  Elevated troponin without cardiovascular symptoms. Place on telemetry, give aspirin, statin. Trend cardiac enzymes x 3. If cardiac enzymes elevate, we will start heparin drip.  4.  Chronic obstructive pulmonary disease.  Continue Advair, Singulair.  5.  Venous thromboembolism prophylaxis with heparin subcutaneous.   CODE STATUS: The patient is full code as discussed at bedside with patient and family.   TIME SPENT: Forty-five minutes.    ____________________________ Aaron Mose. Ashya Nicolaisen, MD dkh:LT D: 01/12/2014 71:21:97 ET T: 01/12/2014 22:25:43 ET JOB#: [id number]  cc: Aaron Mose. Rael Tilly, MD, <Dictator> Nadene Witherspoon Woodfin Ganja MD ELECTRONICALLY SIGNED 01/14/2014 2:00

## 2014-08-27 NOTE — H&P (Signed)
PATIENT NAME:  [name], [name] MR#:  [id number] DATE OF BIRTH:  April 02, 1934  DATE OF ADMISSION:  07/25/2013  PRIMARY CARE PHYSICIAN:  [facility].   CARDIOLOGIST:  Dr. Clayborn Bigness.   REFERRING PHYSICIAN:  Dr. Thomasene Lot.   CHIEF COMPLAINT:  Shortness of breath and cough.   HISTORY OF PRESENT ILLNESS:  The patient is a 81 year old female with a past medical history of chronic atrial fibrillation not on Coumadin, congestive heart failure, nocturnal hypoxemia requiring oxygen during the night and history of rectal cancer with metastasis is presenting to the ER with a chief complaint of shortness of breath and cough for the past four days.  The patient is reporting that yesterday morning she woke up with cough associated with shortness of breath.  Progressively the cough and shortness of breath has been getting worse and the patient is unable to breathe today.  She is brought into the ER.  Her initial troponin is at 0.17.  The patient was given nebulizer treatments.  Soft tissue neck x-ray has revealed no acute radiographic abnormality of the soft tissues of the neck and chest x-ray also revealed no acute cardiopulmonary disease.  The patient was given nebulizer treatment and she was started on IV steroids for acute bronchoconstriction and hospitalist team is called to admit the patient.  After giving nebulizer treatments and IV Solu-Medrol during my examination, the patient started feeling better, but she is still having nasty cough and getting shortness of breath with minimal exertion.  The patient's daughter and daughter's friend were at bedside.  The patient has a chronic history of asthma, but never diagnosed with COPD.   PAST MEDICAL HISTORY:  Chronic atrial fibrillation not on Coumadin, CVA, glaucoma, asthma, hypertension, history of rectal cancer with mets to liver, history of congestive heart failure.   PAST SURGICAL HISTORY:  Back surgery, knee surgery, right hip replacement, partial  resection of the liver.  ALLERGIES:  THE PATIENT IS ALLERGIC TO TYLENOL.    PSYCHOSOCIAL HISTORY:  Lives at home independently at independent living facility.  No history of smoking, alcohol or illicit drug usage.   FAMILY HISTORY:  Heart disease runs in her family.   HOME MEDICATIONS:  Xalatan one drop eye drops into each eye once a day, vitamin D2 50,000 international units 1 capsule once a month, tramadol 50 mg by mouth 3 times a day, torsemide 20 mg by mouth once daily, prednisone 10 mg by mouth every other day, potassium chloride 20 mEq by mouth 2 times a day, oxycodone 5 mg 1/2 tablet by mouth every six hours as needed, oxybutynin 1 tablet by mouth once daily, singular 10 mg by mouth once daily, magnesium oxide 100 mg by mouth once daily, glucosamine/chondroitin sulfate 2 tablets by mouth 3 times a day, Gleevec 600 mg once daily, calcium with vitamin D 1 tablet by mouth 3 times a day, Advair Diskus 50050 1 puff inhalation 2 times a day.   REVIEW OF SYSTEMS: CONSTITUTIONAL:  Denies any fever, but complaining of fatigue and weakness.  EYES:  Denies blurry vision, double vision, glaucoma.  EARS, NOSE, THROAT:  Denies epistaxis, discharge.  Complaining of cough.  Denies any ear pain, hearing loss.  RESPIRATION:  Complaining of cough and wheezing.  Denies any hemoptysis.  Complaining of shortness of breath.  No COPD.  CARDIOVASCULAR:  No chest pain, palpitations.  Denies nausea, vomiting, diarrhea.  GENITOURINARY:  No dysuria, hematuria, renal colic.  GYNECOLOGIC AND BREAST:  Denies breast mass or vaginal discharge.  ENDOCRINE:  Denies polyuria, nocturia, thyroid problems. HEMATOLOGY AND LYMPHATIC:  No anemia, easy bruising or bleeding.  INTEGUMENT:  No acne, rash, lesions.  MUSCULOSKELETAL:  No joint pain in the neck and back.  Denies any gout.  NEUROLOGIC:  Denies any vertigo or ataxia. PSYCHIATRIC:  No ADD, OCD, insomnia.   PHYSICAL EXAMINATION:  VITAL SIGNS:  Temperature 97.9, pulse  78, respirations 16, blood pressure 141/69, pulse ox 95% on 2 liters.  GENERAL APPEARANCE:  Not under acute distress.  Moderately built and nourished.  HEENT:  Normocephalic, atraumatic.  Pupils are equally reacting to light and accommodation.  No conjunctival injection.  No scleral icterus.  Moist mucous membranes.  NECK:  Supple.  No JVD.  No thyromegaly.  Range of motion is intact.  LUNGS:  Coarse bronchial breath sounds with bronchial squeak and wheezing.  No crackles, positive rhonchi.  CARDIOVASCULAR:  Irregularly irregular.  No murmurs.  No anterior chest wall tenderness on palpation.  GASTROINTESTINAL:  Soft.  Bowel sounds are positive in all four quadrants.  Nontender, nondistended.  No hepatosplenomegaly.  No masses felt.  NEUROLOGIC:  Awake, alert, oriented x 3.  Motor and sensory grossly intact.  Reflexes are 2+.  EXTREMITIES:  No edema.  No cyanosis.  No clubbing.  Peripheral pulses are 2+.  SKIN:  Warm to touch.  Normal turgor.  No rashes.  No lesions.  MUSCULOSKELETAL:  No joint effusion, tenderness or erythema.  PSYCHIATRIC:  Normal mood and affect.   LABORATORY AND IMAGING STUDIES:  BNP is 5396.  Glucose 165, BUN, creatinine, sodium, potassium are normal.  Anion gap is 5, GFR 42, calcium is 8.1.  Serum osmolality 287.  Troponin 0.17, WBC 5.1, hemoglobin 10.4, hematocrit 31.9, platelets are 189.  Chest x-ray PA and lateral views:  No acute cardiopulmonary process.  Soft tissue neck x-ray has revealed no acute radiographic abnormality of the soft tissue of the neck.  A 12-lead EKG has revealed sinus rhythm with premature supraventricular complexes, tachycardic at 96 beats per minute, normal PR interval, left axis deviation, normal QRS.  No acute ST-T wave changes.   ASSESSMENT AND PLAN:  A 81 year old African American female presenting to the ER with a chief complaint of shortness of breath for a 4 day history of cough, will be admitted with the following assessment and plan.  1.   Acute respiratory distress from acute bronchitis with bronchoconstriction, probably underlying interstitial pneumonitis.  We will admit her to telemetry.  We will provide her intravenous steroids.   Nebulizer treatments and the patient will be given intravenous antibiotics.  2.  Abnormal troponin.  Cycle cardiac biomarkers.  Cardiology consult is placed to Dr. Clayborn Bigness.  The patient is not fluid overloaded, not under acute exacerbation of congestive heart failure at this time.  This abnormal troponin could be from demand ischemia.  We will put her on acute coronary syndrome protocol with oxygen, nitroglycerin, aspirin, beta blocker and statin.  3.  Chronic history of congestive heart failure, not fluid overloaded.  We will monitor for symptoms and signs of fluid overload.  4.  Chronic atrial fibrillation, not on any Coumadin, rate controlled.  She has a history of metastatic rectal cancer.   5.  CODE STATUS:  SHE IS A FULL CODE.  Daughter is the medical power of attorney.   6.  We will provide her gastrointestinal and deep vein thrombosis prophylaxis.   Total time spent on the admission is 45 minutes.    Consult is placed to Dr. Clayborn Bigness.  ____________________________ Nicholes Mango, MD ag:ea D: 07/25/2013 01:12:39 ET T: 07/25/2013 04:09:21 ET JOB#: [id number]  cc: Nicholes Mango, MD, <Dictator> Dwayne D. Clayborn Bigness, MD [facility]  Nicholes Mango MD ELECTRONICALLY SIGNED 08/10/2013 7:29

## 2014-08-27 NOTE — Discharge Summary (Signed)
PATIENT NAMESHERITTA, Morris MR#:  [id number] DATE OF BIRTH:  November 26, 1933  DATE OF ADMISSION:  01/12/2014 DATE OF DISCHARGE:  01/13/2014  ADMITTING DIAGNOSES:  1. Weakness.  2. Urinary tract infection.  3. Elevated troponin.  4. Chronic obstructive pulmonary disease.   DISCHARGE DIAGNOSES:   1. Elevated troponin, likely demand ischemia.   2. Generalized weakness.  3. Left leg pain of unclear etiology at this time.  4. Suspected urinary tract infection.   5. History of hyperlipidemia, atrial fibrillation, stroke, gastrointestinal stromal tumor with metastasis.   6. Diarrhea likely related to gastrointestinal condition, chronic.   7. Chronic obstructive pulmonary disease.   8. Glaucoma.    DISCHARGE CONDITION: Stable.   DISCHARGE MEDICATIONS: The patient is to resume Advair Diskus 500/50 one puff twice daily, Xalatan 0.005% ophthalmic solution 1 drop to each eye at bedtime, oxybutynin extended release 10 mg p.o. once daily, magnesium oxide 100 mg p.o. daily, Align 4 mg once daily, calcium with vitamin D 600/600/200 one tablet 3 times daily, Azopt 1% ophthalmic suspension 1 drop to each eye twice daily, glucosamine and chondroitin with MSM 400/500/250 tablet 2 tablets 3 times daily, Ferrex 150 Forte vitamin A07 with folic acid and iron capsules 2 capsules once at bedtime, montelukast 10 mg p.o. at bedtime, vitamin D2 50,000 units once monthly, Gleevec 500 mg p.o. daily, digoxin 125 mcg p.o. daily, Alphagan 0.15% ophthalmic solution 1 drop to each affected eye 3 times daily, furosemide 10 mg p.o. daily, prednisone 10 mg p.o. every second day, potassium chloride unknown dose 1 tablet once daily, tramadol advanced dose to 100 mg 3 times daily as needed, atorvastatin 20 mg p.o. at bedtime, aspirin 305 mg p.o. daily, Keflex 500 mg p.o. 3 times daily for 2  more days.   HOME OXYGEN: None.   DIET: 2 grams salt, low fat, low cholesterol, regular consistency.   ACTIVITY LIMITATIONS: As tolerated.    FOLLOWUP APPOINTMENT: With doctors making house calls in 2 days after discharge.    CONSULTANTS: Care management, social work.   RADIOLOGIC STUDIES: Chest x-ray portable single view, 01/12/2014 showed stable cardiac enlargement and bibasilar scarring, no acute pulmonary changes were noted. CT scan of head without contrast 01/12/2014 revealed atrophy and patchy small vessel disease and prior small infarct in the left lentiform nucleus and inferior left centrum semiovale, no intracranial mass, hemorrhage or acute-appearing infarct. Lung VQ scan 01/13/2014 revealed patchy minimal matching ventilation perfusion defects in non-segmental distribution, no appreciable ventilation perfusion mismatch, these findings consistent with an overall low probability of pulmonary embolus, cardiomegaly was also noted. Left lower extremity Doppler ultrasound 01/12/2014 revealed no evidence of deep vein thrombosis, subcutaneous edema in the calf regions were noted.   HISTORY OF PRESENT ILLNESS: The patient is a 81 year old African-American female with a history of gastrointestinal stromal cancer with metastasis, who presented to the hospital with complaints of weakness. Please refer to Dr. Samantha Crimes admission note on 01/12/2014. Apparently the patient was having generalized weakness, but also described left lower extremity weakness more than usual, but mostly associated with pain in that leg. Apparently when EMS saw her at home she was noted to be febrile, however there were no fevers reported in the Emergency Room or during her stay in the hospital. On arrival to the Emergency Room the patient's temperature was 99.3, pulse was 94, respiration rate was 18, blood pressure 132/72, saturation was 98% on room air. Physical examination was unremarkable. The patient's laboratory data done on admission showed BUN and creatinine  of 29 and 1.69, potassium 3.3, otherwise BMP was unremarkable. The patient's liver enzymes revealed elevation  of AST to 51, otherwise unremarkable. Cardiac enzymes were abnormal with the first troponin 0.4, second 0.5, third 0.57.  MB fraction was also slightly elevated at 4.2, 4.1, and 4.4 respectively. The patient's white blood cell count was normal at 8.5, hemoglobin was 10.5, platelet count 112,000. Coagulation panel was unremarkable. Blood cultures taken on 01/12/2014 showed no growth. Urinalysis revealed 5 white blood cells, 1 red blood cell, 3+ bacteria. Clostridium difficile testing was negative. EKG showed atrial fibrillation, premature ventricular or aberrantly conducted complexes at 97 beats per minute, left axis deviation, and nonspecific ST-T changes, possible inferior infarct age indeterminate. The patient was admitted to the hospital. Blood cultures were taken for questionable fevers, but since we were not sure if in fact had any high fevers we did not initiate antibiotic therapy at this time. She remained afebrile over the next 2 days.    Her left leg pain was investigated by Doppler ultrasound and that showed no DVT.  The patient was also investigated for possible stroke. She had CT scan of her head done which showed atrophy as well as possible prior small infarction left lentiform nucleus as well as inferior left centrum semiovale which could correspond to patient's left lower extremity weakness. However  the patient refused MRI of her brain due to inability to lay down flat. The patient was advised to continue aspirin therapy for suspected stroke.  She was also recommended to continue statin. Atorvastatin was initiated for her at bedtime.    Elevated troponin, it was unclear why the patient was having elevation of troponin, however a VQ scan of her chest was performed which had  low probability for pulmonary embolus. The patient was evaluated by Dr. Ubaldo Glassing, who felt that the patient's elevation of troponin was of unclear etiology with very likely demand ischemia and since the patient had cardiac  catheterization approximately 1-1/2 years ago which showed unremarkable insignificant coronary artery disease he felt that it was unlikely that the patient would develop significant coronary artery occlusion over the past 1-1/2 years, so no further investigation was recommended by Dr. Ubaldo Glassing.  The patient however is to continue aspirin therapy as well as statin.   In regards to generalized weakness the patient was advised by physical therapist to return back home with home health physical therapy, which is going to be implemented for her upon discharge.   In regards to suspected UTI the patient was initiated on antibiotic therapy and she is to continue antibiotics for 2 more days. Blood cultures were negative. A urine culture was not taken. Stool cultures pending and Clostridium difficile was negative. The patient is to report to her primary care physician any other fevers if she develops.    For other chronic medical problems such as GI stromal tumor with metastases as well as chronic diarrhea which is felt to be related to GI condition, as well as COPD and glaucoma, hyperlipidemia, history of stroke, she is to continue her outpatient management, no changes were made here. The patient is being discharged in stable condition with abovementioned medications and followup. On the day of discharge, temperature is 98.9, pulse was 71, respirations were 20, blood pressure 123/72, saturation 100% on room air at rest.   TIME SPENT: 40 minutes.     ____________________________ Theodoro Grist, MD rv:bu D: 01/13/2014 16:57:48 ET T: 01/13/2014 20:15:21 ET JOB#: [id number]  cc: Theodoro Grist, MD, <Dictator> Doctors making  house calls [place] MD ELECTRONICALLY SIGNED 01/25/2014 20:56

## 2014-08-27 NOTE — Discharge Summary (Signed)
PATIENT NAMENIYLA, [name] MR#:  [id number] DATE OF BIRTH:  [birth year]  DATE OF ADMISSION:  07/25/2013 DATE OF DISCHARGE:  07/31/2013  ADMITTING DIAGNOSIS: Shortness of breath and cough.   DISCHARGE DIAGNOSES: 1.  Acute respiratory failure due to asthmatic bronchitis with significant improvement in her symptoms.  2.  Methicillin-resistant Staphylococcus aureus diarrhea. The patient is started on vancomycin.  3.  Acute renal failure on chronic kidney disease. The patient's renal function will need to be monitored as outpatient.  4.  Atrial fibrillation.  5.  Chronic toe wound infection.  6.  Glaucoma.  7.  History of congestive heart failure and edema without any evidence of acute exacerbation noted during hospitalization.  8.  Previous history of cerebrovascular accident.  9.  Hypertension.  10.  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      CONSULTANTS: Dr. Nehemiah Massed, Dr. Raul Del.   LABORATORY, DIAGNOSTIC AND RADIOLOGICAL DATA:  Admitting glucose 138, BUN 20, creatinine 1.14, sodium 140, potassium 4.1, chloride 110, CO2 is 27, calcium is 8.4. Troponin 0.20, then 0.19. Digoxin 1.28. Theophylline level 9.2. WBC 4.2, hemoglobin 10.3, platelet count 113,000. Stool cultures showed moderate growth of methicillin-resistant staph aureus. C. diff was negative. EKG on admission sinus rhythm with occasional PVCs. Chest x-ray shows stable cardiomegaly without acute cardiopulmonary processes.   HOSPITAL COURSE: Please refer to H and P done by the admitting physician. The patient is a 81 year old white female with previous history of gastric stromal tomorrow with mets to the liver and lung, who presented with acute respiratory failure. The patient was evaluated in the ED and there was no evidence of CHF or pneumonia however, the patient did have bronchospasm with wheezing. She was admitted for initially possible interstitial  pneumonitis with acute bronchospasm. The patient was treated with nebulizers and steroids, and given IV antibiotics. With these treatments, her symptoms are significantly improved. Now her breathing is back to baseline. She was seen in consultation by cardiology and pulmonary. The patient also was having diarrhea and stool studies did show MRSA in the stool. Dr. Leslye Peer discussed the case with Dr. Vira Agar, who recommended 10 days of vancomycin orally. With these treatments, her diarrhea has resolved. She will continue p.o. vancomycin. The patient was noted to have an elevated troponin, felt to be due to demand ischemia. Cardiology saw the patient and recommended just monitoring patient. She also has a history of congestive heart failure and there was no evidence of congestive heart failure. At this time, she is doing much better and is stable for discharge. Discharge instructions for congestive heart failure given.   DISCHARGE MEDICATIONS: Advair 500/50 one puff b.i.d., Xalatan 0.005% one drop to each eye at bedtime, oxybutynin 10 mg 1 tab p.o. daily, mag oxide 100 mg daily, Align 4 mg daily, calcium plus vitamin D 1 tab p.o. t.i.d., Alphagan 0.1% ophthalmic solution 1 drop to each eye b.i.d., Azopt 1% one drop to each eye b.i.d., glucosamine/chondroitin 2 tabs t.i.d., Ferrex 150 two caps at bedtime, tramadol 50 mg 1 tab p.o. t.i.d. as needed for pain, Singulair 10 daily, vitamin D2 50,000 international units q. monthly, Gleevec 400 mcg daily, digoxin 125 mcg daily, prednisone 60 mg taper x 10 mg, aspirin 325 daily, Cardizem CD 360 daily, theophylline 200 mg 1 tab p.o. b.i.d., albuterol ipratropium 4 times a day, vancomycin 250 one tabs 4 times a day for 8 more days.   HOME HEALTH: To be resumed.  DIET: Low sodium, low fat, low cholesterol regular  TIMEFRAME FOR FOLLOW-UP WITH PRIMARY MD: In 1 to 2 weeks.   TIME SPENT ON THIS PATIENT: Was 35 minutes.    ____________________________ Lafonda Mosses Posey Pronto,  MD shp:cs D: 08/01/2013 08:23:34 ET T: 08/01/2013 18:35:29 ET JOB#: [id number]  cc: Thersia Petraglia H. Posey Pronto, MD, <Dictator> Alric Seton MD ELECTRONICALLY SIGNED 08/02/2013 12:54

## 2014-09-04 NOTE — H&P (Signed)
PATIENT NAME:  [name], [name] MR#:  [id number] DATE OF BIRTH:  [birth year]  DATE OF ADMISSION:  05/31/2014  REFERRING PHYSICIAN:  Verdia Kuba. Kerman Passey, MD  PRIMARY CARE PHYSICIAN:  Sarajane Jews, MD  PULMONOLOGIST:  Freda Munro Raul Del, MD  CHIEF COMPLAINT:  Shortness of breath.   HISTORY OF PRESENT ILLNESS:  This is a 81 year old African-American female with a history of asthma, at least moderate persistence based off of her home medications, as well as GIST with known metastases to liver and lung, presenting with shortness of breath. She describes 2-day duration of shortness of breath associated with wheezing as well as nonproductive cough. Denies any fevers or chills. She actually saw her pulmonologist for the above symptoms, given steroids, and started taking them just the day of admission. She presented to the hospital for workup and evaluation, as her symptoms were persistent. Upon arrival to the ER, she was noted to be saturating well on room air, however, had persistent wheezing and some tachypneic despite a total of 4 to 5 breathing treatments.   REVIEW OF SYSTEMS: CONSTITUTIONAL: Denies fevers. Positive for fatigue and weakness.  EYES:  Denies blurred vision, double vision, or eye pain.  EARS, NOSE, AND THROAT:  Denies tinnitus, ear pain, or hearing loss.  RESPIRATORY:  Positive for cough and wheeze as stated above.  CARDIOVASCULAR:  Denies chest pain or palpitations. Positive for edema.  GASTROINTESTINAL:  Denies nausea, vomiting, diarrhea, or abdominal pain.  GENITOURINARY:  Denies dysuria or hematuria.  ENDOCRINE:  Denies nocturia or thyroid problems.  HEMATOLOGIC AND LYMPHATIC:  Denies easy bruising or bleeding. SKIN: Denies rash or lesions.  MUSCULOSKELETAL:  Denies pain in the neck, back, shoulders, knees, or hips or arthritic symptoms.  NEUROLOGIC:  Denies paralysis or paresthesias.  PSYCHIATRIC:  Denies anxiety or depressive symptoms.  Otherwise, full review of systems  performed by me is negative.   PAST MEDICAL HISTORY:  Includes hyperlipidemia unspecified, paroxysmal atrial fibrillation, asthma with moderate persistence, history of CVA without residual neurologic deficit, history of gastrointestinal stromal tumor with known metastases to liver and lung.   FAMILY HISTORY:  Positive for hypertension.   SOCIAL HISTORY:  Denies any alcohol, tobacco, or drug use. She currently resides at [facility] assisted living facility. She uses a walker for ambulation.   ALLERGIES:  MOTRIN AS WELL AS TYLENOL.   HOME MEDICATIONS:  Include aspirin 300 mg p.o. daily, tramadol 50 mg 2 tablets 3 times a day as needed for pain, digoxin 125 mcg p.o. daily, diazepam 5 mg p.o. b.i.d. as needed for anxiety, Advair 100/50 mcg inhalation b.i.d., theophylline 200 mg p.o. b.i.d., metolazone 5 mg on Tuesdays and Fridays, torsemide 20 mg 2 tabs p.o. b.i.d., montelukast 10 mg p.o. daily, potassium of unknown dosage twice daily, brinzolamide 1% ophthalmic suspension to affected eye twice daily, latanoprost ophthalmic solution 0.005% one drop to each eye in the evening, and ergocalciferol 50,000 international units p.o. q. weekly, as well as prednisone 10 mg p.o. every other day.   PHYSICAL EXAMINATION: VITAL SIGNS:  Temperature 98.7, heart rate 91, respirations 23, blood pressure 138/75, saturating 92% on room air and currently 99% on room air. Weight 62.1 kg, BMI 22.8.  GENERAL:  Weak-appearing African-American female, currently in no acute distress.  HEAD:  Normocephalic, atraumatic.  EYES:  Pupils are equal, round, and reactive to light. Extraocular muscles are intact. No scleral icterus.  MOUTH:  Moist mucous membranes. Dentition is intact. No abscess noted.  EARS, NOSE, AND THROAT:  Clear without exudates.  No external lesions.  NECK:  Supple. No thyromegaly. No nodules. No JVD.  PULMONARY:  Diffuse expiratory wheezing throughout all lung fields as well as coarse rhonchi at the bases  bilaterally. No use of accessory muscles. He is unable to speak in full sentences. He will speak in 4 to 5-word sentences with minimal difficulty.  CHEST:  Nontender to palpation.  CARDIOVASCULAR:  S1, S2. Irregular rate, irregular rhythm. No murmurs, rubs, or gallops. There is 1+ edema of the lower extremities bilaterally to the knees. Pedal pulses 2+ bilaterally.  GASTROINTESTINAL:  Soft, nontender, nondistended. No masses. Positive bowel sounds. No hepatosplenomegaly.  MUSCULOSKELETAL:  No swelling, clubbing, or edema. Range of motion is full in all extremities.  NEUROLOGIC:  Cranial nerves II through XII are intact. No gross focal neurologic deficit. Sensation is intact. Reflexes are intact.  SKIN:  No ulceration, lesions, rashes, or cyanosis. Skin is warm and dry. Turgor is intact.  PSYCHIATRIC:  Mood and affect are within normal limits. The patient is awake, alert, and oriented x 3. Insight and judgment are intact.   LABORATORY DATA:  Sodium is 141, potassium 3.4, chloride 103, bicarbonate 30, BUN 21, creatinine 1.49, glucose 171. LFTs:  Alkaline phosphatase 33, AST 52, ALT 25; otherwise, LFTs within normal limits. Troponin is 0.22. CK is 298. WBC is 4.9, hemoglobin 10.1, platelets 105,000. Urinalysis is negative for evidence of infection.   Chest x-ray performed, which reveals stable cardiomegaly and bibasilar atelectasis.   ASSESSMENT AND PLAN:  A 81 year old African-American female with a history of asthma with at least moderate persistence based off of medications as well as paroxysmal atrial fibrillation and history of gastrointestinal stromal tumor with known metastases to liver and lung, presenting with shortness of breath.  1.  Asthma exacerbation.  We will place on supplemental oxygen as required to keep SaO2 greater than 92%. DuoNeb treatments q. 4 hours and steroids, Solu-Medrol 60 mg intravenously daily. Continue with her home dosage of prednisone, Advair, theophylline, and  Singulair. We will consult pulmonary, as she follows with Dr. Raul Del on an outpatient basis.  2.  Hypokalemia. Replace potassium to goal of 4 to 5.  3.  Elevated troponin. We will place on telemetry. We will trend cardiac enzymes; however, this is chronically elevated, and she has no symptoms suggestive of cardiac disease.  4.  Venous thromboembolism prophylaxis with heparin subcutaneously.   CODE STATUS:  The patient is a full code.   TIME SPENT:  45 minutes.    ____________________________ Aaron Mose. Beverley Allender, MD dkh:nb D: 06/01/2014 00:14:43 ET T: 06/01/2014 00:45:58 ET JOB#: [id number]  cc: Aaron Mose. Duward Allbritton, MD, <Dictator> Kahlan Engebretson Woodfin Ganja MD ELECTRONICALLY SIGNED 06/02/2014 2:23

## 2014-09-04 NOTE — Op Note (Signed)
PATIENT NAMEBREEANNA, [name] MR#:  [id number] DATE OF BIRTH:  04-18-1934  DATE OF PROCEDURE:  05/23/2014  PREOPERATIVE DIAGNOSES: 1.  Peripheral arterial disease with ulceration, left lower extremity.  2.  Ulcer, left foot.  3.  Hyperlipidemia.  4.  Cardiac arrhythmia.  5.  History of stroke.  POSTOPERATIVE DIAGNOSES:  1.  Peripheral arterial disease with ulceration, left lower extremity.  2.  Ulcer, left foot.  3.  Hyperlipidemia.  4.  Cardiac arrhythmia.  5.  History of stroke.  PROCEDURES PERFORMED: 1.  Ultrasound guidance for vascular access, right femoral artery.  2.  Catheter placement to left peroneal artery and left anterior tibial artery from right femoral approach.  3.  Aortogram and selective left lower extremity angiogram.  4.  Percutaneous transluminal angioplasty of left anterior tibial artery with 3 mm diameter angioplasty balloon.  5.  Percutaneous transluminal angioplasty of left peroneal artery with 2.5 mm diameter angioplasty balloon.  6.  Star close closure device, right femoral artery.   SURGEON: Algernon Huxley, M.D.   ANESTHESIA: Local with moderate conscious sedation.   ESTIMATED BLOOD LOSS: Minimal.   INDICATION FOR PROCEDURE: This is a 81 year old female with a long-standing nonhealing ulcer of the left foot. She was sent by her facility due to noncompressible ABIs, which inconclusive and suspicion clinically of PAD. Angiogram was performed for further evaluation for potential treatment of her arterial insufficiency. Risks and benefits were discussed. Informed consent was obtained.   DESCRIPTION OF PROCEDURE: The patient is brought to the vascular suite. Groins were shaved and prepped and a sterile surgical field was created. The right femoral head was visualized with fluoroscopy. The right femoral artery was then imaged with ultrasound and found to be patent. It was accessed under direct ultrasound guidance without difficulty with a Seldinger needle. A J-wire  and 5-French sheath were then placed. Pigtail catheter was placed in the aorta at the L1 level and AP aortogram was performed. This demonstrated what appeared to be a pretty slow cardiac output with no obvious flow limiting stenosis in the aorta or iliac arteries and no visceral vessel stenosis that I could identify. I then crossed the aortic bifurcation, advanced to the left femoral head and selective left lower extremity angiogram was then performed. Due to the slow circulation time, I had to advance the catheter further down the SFA to opacify more distally. Imaging showed normal common femoral artery, profunda femoris artery. The superficial femoral artery was heavily calcified, but not stenotic. The popliteal artery was also heavily calcified, but not stenotic. There was a typical tibial trifurcation and the tibial vessels were heavily diseased. The anterior tibial artery was the best runoff distally, but it had about a 90% near occlusive stenosis about 3 to 4 cm beyond its origin and then was continuous distally, although it was small at the level of the foot. The peroneal artery was also an additional runoff vessel and there was about an 80 to 90% stenosis over the proximal 5 to 6 cm of the peroneal artery. This vessel was smaller and less robust than in the anterior tibial artery. The posterior tibial artery was also diseased proximally, but this occluded in the mid to distal segments and did not appear to reconstitute distally. The patient was heparinized with 3500 units of intravenous heparin for systemic anticoagulation. A 6-French Ansell sheath was placed over a Terumo Advantage wire and advanced into the left superficial femoral artery. With the help of a Kumpe catheter, and 0.018 wire  was able to navigate down into the anterior tibial artery initially, this being the best runoff. This was her primary target, but I elected to treat both this and the peroneal artery to try to optimize her perfusion with  her nonhealing ulcer. The stenoses was heavily. The catheter did not cross easily, but I was able to navigate an 0.018 wire beyond the lesion. A 3 mm diameter x 10 cm in length angioplasty balloon was then taken across the lesion and inflated. A tight narrowing was seen, which did not resolve until about 14 atmospheres with resolution. We held the inflation for approximately 1 minute. The balloon was then deflated and completion angiogram showed markedly improved flow with only about a 20% residual stenosis that was not flow limiting. I then turned my attention to the peroneal artery. I was able to cross this area of stenosis, in the proximal peroneal artery, and without difficulty with the 0.018 wire I then treated the lesion with a 2.5 mm diameter angioplasty balloon with a good angiographic result and less than 20% residual stenosis. The peroneal artery was still small distally, but this did allow some improved perfusion distally in addition to our treated anterior tibial artery. With seeing no reconstitution of the posterior tibial artery distally, I did not feel that treating the proximal lesion of the posterior tibial artery was likely be of any benefit. At this point, I thought we had maximize the perfusion through her 2 largest tibial vessels. Her poor output circulation time was not something we could treat from the vascular surgery standpoint, but it is also likely contributing to her nonhealing ulcer. At this point, I elected to terminate the procedure. The sheath was removed. StarClose closure device was deployed in the usual fashion with excellent hemostatic result. The patient tolerated the procedure well and was taken to the recovery room in stable condition.   ____________________________ Algernon Huxley, MD jsd:sb D: 05/23/2014 11:39:44 ET T: 05/23/2014 13:29:05 ET JOB#: [id number]  cc: Algernon Huxley, MD, <Dictator> Sarajane Jews, MD Algernon Huxley MD ELECTRONICALLY SIGNED 05/26/2014 10:41

## 2014-09-04 NOTE — Discharge Summary (Signed)
PATIENT NAMEKRYSTALLE, [name] MR#:  [id number] DATE OF BIRTH:  1933-06-11  DATE OF ADMISSION:  01/26/201601/28/2016 DATE OF DISCHARGE:  06/05/2014  DISCHARGE DIAGNOSES: 1. Acute diastolic congestive heart failure. 2. Asthma flare. 3. Hypertension. 4. Gastrointestinal stromal tumor in history.  CODE STATUS ON DISCHARGE:  FULL CODE.  MEDICATIONS ON DISCHARGE:  1. Tramadol 50 mg oral 2 tablets three times a day as needed. 2. Aspirin 300 mg once a day. 3. Metolazone 5 mg 2 times a day. 4. Albuterol inhalation twice a day. 5. Montelukast 10 mg once a day. 6. Diazepam 5 mg every 12 hours as needed for anxiety. 7. Ergocalciferol 50,000 international units once a week. 8. Fluticasone and salmeterol inhalation 1 puff 2 times a day. 9. Latanoprost ophthalmic drop, 1 drop each eye once a day. 10. Theophylline 200 mg oral 2 times a day. 11. Gleevec 400 mg oral tablet once a day. 12. Prednisone 10 mg oral 3 tablets once a day, prednisone 20 mg once a day and 10 mg continue once.  13. Digoxin 125 mcg oral tablet once a day. 14. Torsemide 20 mg once a day. 15. Carvedilol 3.125 mg 2 times a day.  DISCHARGE INSTRUCTIONS: Advised to have home health on discharge, physical therapy, and rehab.  The patient is being transferred to rehab center for further care.  Advised to follow in 1-2 weeks with Dr. Raul Morris.  HISTORY OF PRESENTING ILLNESS: A 81 year old African American female with history of asthma and had GIST with known metastasis to liver and lung in the past, presented with shortness of breath for 2 days duration with wheezing, as well as nonproductive cough.  She still had wheezing continued, despite 4-5 breathing treatments in ER, and so admitted with possibility of asthma exacerbation.  HOSPITAL COURSE AND STAY:   1. Asthma exacerbation. The patient was started on steroid and nebulizer treatment, but she was also noted to be CHF flare, and it was acute diastolic with ejection fraction 55%.  We  changed her medication to torsemide and DuoNeb and oral steroid.  Her saturation came to 97% on room air, and then she was more comfortable.  We continued her home medication, Advil, Theophylline, and Singulair, and Dr. Raul Morris saw the patient also. We are advising to follow up with him in the office.  Overall improvement in the condition, but generalized weakness, and so we are recommending rehab on discharge. 2. Hypokalemia. Potassium was replaced and came up and remained stable. 3. Elevated troponin, was chronically elevated and remained stable in the hospital, so there were no cardiac issues to blame for that. 4. Gastrointestinal stomal tumor with metastasis to liver and lung and she was taking Gleevec for the last 20 years.  We continued that in the hospital and discharging, continue same.  She follows with Dr. Oliva Morris for her cancer issues.  CONSULTS IN THE HOSPITAL: Dr. Raul Morris for pulmonary.  IMPORTANT LABORATORY RESULTS IN THE HOSPITAL: Glucose 171, BUN 21, creatinine 1.49, sodium 141, potassium 3.4, chloride 103, and CO2 30 on admission.  WBC 4.9, hemoglobin 10.1, platelet count is 105, MCV is 89.  Troponin 0.22 on admission.  Urinalysis grossly negative.  Chest x-ray portable on admission shows stable cardiomegaly and basilar atelectasis and scar.  Troponin remained stable at 0.29 and 0.34.  Creatinine went up to 1.74 and 1.77 on 06/03/2014.  Echocardiogram ejection fraction 55-60%, moderate concentric left ventricular hypertrophy, moderately increased ventricular septal thickness, mild-to-moderate aortic valve stenosis.  TOTAL TIME SPENT IN THIS DISCHARGE: 45 minutes.  ____________________________ Sonya Lund Anselm Jungling, MD vgv:ap D: 06/05/2014 10:10:00 ET T: 06/05/2014 10:26:45 ET   JOB#: [id number]  cc: Sonya E. Sonya Del, MD Sonya Basta MD ELECTRONICALLY SIGNED 06/22/2014 15:47

## 2014-09-23 ENCOUNTER — Other Ambulatory Visit: Payer: Self-pay | Admitting: *Deleted

## 2014-09-23 DIAGNOSIS — C49A0 Gastrointestinal stromal tumor, unspecified site: Secondary | ICD-10-CM

## 2014-09-23 DIAGNOSIS — C787 Secondary malignant neoplasm of liver and intrahepatic bile duct: Secondary | ICD-10-CM

## 2014-09-26 ENCOUNTER — Inpatient Hospital Stay: Payer: Medicare Other

## 2014-09-26 ENCOUNTER — Inpatient Hospital Stay: Payer: Medicare Other | Attending: Oncology | Admitting: Oncology

## 2014-09-26 ENCOUNTER — Encounter (INDEPENDENT_AMBULATORY_CARE_PROVIDER_SITE_OTHER): Payer: Self-pay

## 2014-09-26 VITALS — BP 122/77 | HR 76 | Wt 131.5 lb

## 2014-09-26 DIAGNOSIS — D631 Anemia in chronic kidney disease: Secondary | ICD-10-CM

## 2014-09-26 DIAGNOSIS — C7800 Secondary malignant neoplasm of unspecified lung: Secondary | ICD-10-CM

## 2014-09-26 DIAGNOSIS — C787 Secondary malignant neoplasm of liver and intrahepatic bile duct: Secondary | ICD-10-CM | POA: Insufficient documentation

## 2014-09-26 DIAGNOSIS — E785 Hyperlipidemia, unspecified: Secondary | ICD-10-CM | POA: Diagnosis not present

## 2014-09-26 DIAGNOSIS — Z79899 Other long term (current) drug therapy: Secondary | ICD-10-CM | POA: Diagnosis not present

## 2014-09-26 DIAGNOSIS — Z7982 Long term (current) use of aspirin: Secondary | ICD-10-CM | POA: Insufficient documentation

## 2014-09-26 DIAGNOSIS — I10 Essential (primary) hypertension: Secondary | ICD-10-CM | POA: Diagnosis not present

## 2014-09-26 DIAGNOSIS — J45909 Unspecified asthma, uncomplicated: Secondary | ICD-10-CM | POA: Diagnosis not present

## 2014-09-26 DIAGNOSIS — C49A0 Gastrointestinal stromal tumor, unspecified site: Secondary | ICD-10-CM

## 2014-09-26 DIAGNOSIS — I739 Peripheral vascular disease, unspecified: Secondary | ICD-10-CM | POA: Diagnosis not present

## 2014-09-26 DIAGNOSIS — Z8673 Personal history of transient ischemic attack (TIA), and cerebral infarction without residual deficits: Secondary | ICD-10-CM | POA: Insufficient documentation

## 2014-09-26 DIAGNOSIS — D649 Anemia, unspecified: Secondary | ICD-10-CM | POA: Insufficient documentation

## 2014-09-26 DIAGNOSIS — R197 Diarrhea, unspecified: Secondary | ICD-10-CM | POA: Diagnosis not present

## 2014-09-26 DIAGNOSIS — C494 Malignant neoplasm of connective and soft tissue of abdomen: Secondary | ICD-10-CM | POA: Diagnosis not present

## 2014-09-26 DIAGNOSIS — I4891 Unspecified atrial fibrillation: Secondary | ICD-10-CM | POA: Insufficient documentation

## 2014-09-26 DIAGNOSIS — F419 Anxiety disorder, unspecified: Secondary | ICD-10-CM | POA: Insufficient documentation

## 2014-09-26 DIAGNOSIS — N189 Chronic kidney disease, unspecified: Secondary | ICD-10-CM

## 2014-09-26 LAB — CBC WITH DIFFERENTIAL/PLATELET
BASOS ABS: 0 10*3/uL (ref 0–0.1)
Basophils Relative: 1 %
EOS PCT: 4 %
Eosinophils Absolute: 0.2 10*3/uL (ref 0–0.7)
HEMATOCRIT: 31.2 % — AB (ref 35.0–47.0)
HEMOGLOBIN: 10.1 g/dL — AB (ref 12.0–16.0)
Lymphocytes Relative: 26 %
Lymphs Abs: 1.4 10*3/uL (ref 1.0–3.6)
MCH: 28.6 pg (ref 26.0–34.0)
MCHC: 32.5 g/dL (ref 32.0–36.0)
MCV: 88.1 fL (ref 80.0–100.0)
MONOS PCT: 10 %
Monocytes Absolute: 0.5 10*3/uL (ref 0.2–0.9)
NEUTROS PCT: 59 %
Neutro Abs: 3.3 10*3/uL (ref 1.4–6.5)
Platelets: 137 10*3/uL — ABNORMAL LOW (ref 150–440)
RBC: 3.54 MIL/uL — ABNORMAL LOW (ref 3.80–5.20)
RDW: 14.1 % (ref 11.5–14.5)
WBC: 5.5 10*3/uL (ref 3.6–11.0)

## 2014-09-26 LAB — COMPREHENSIVE METABOLIC PANEL
ALT: 17 U/L (ref 14–54)
AST: 35 U/L (ref 15–41)
Albumin: 4 g/dL (ref 3.5–5.0)
Alkaline Phosphatase: 31 U/L — ABNORMAL LOW (ref 38–126)
Anion gap: 7 (ref 5–15)
BUN: 31 mg/dL — ABNORMAL HIGH (ref 6–20)
CO2: 33 mmol/L — ABNORMAL HIGH (ref 22–32)
CREATININE: 1.69 mg/dL — AB (ref 0.44–1.00)
Calcium: 8.3 mg/dL — ABNORMAL LOW (ref 8.9–10.3)
Chloride: 99 mmol/L — ABNORMAL LOW (ref 101–111)
GFR calc Af Amer: 32 mL/min — ABNORMAL LOW (ref >60)
GFR calc non Af Amer: 28 mL/min — ABNORMAL LOW (ref >60)
GLUCOSE: 98 mg/dL (ref 65–99)
Potassium: 3.3 mmol/L — ABNORMAL LOW (ref 3.5–5.1)
Sodium: 139 mmol/L (ref 135–145)
Total Bilirubin: 0.5 mg/dL (ref 0.3–1.2)
Total Protein: 6.3 g/dL — ABNORMAL LOW (ref 6.5–8.1)

## 2014-10-01 NOTE — Progress Notes (Signed)
[place] @ [facility] Telephone:(336) [phone]  Fax:([phone]) [place] OB: 1933/12/25  MR#: [id number]  JYN#:[id number]  Patient Care Team: No Pcp Per Patient as PCP - General (General Practice)  CHIEF COMPLAINT:  Chief Complaint  Patient presents with  . Follow-up     No history exists.  gastrointestinal stromal tumor, diagnosis.  In 1985 (or rectal cancer removed, was gastrointestinal stromal tumor) liver resection in 1989  Recurrence , in the lung.  Status  post resecti and radiation, 1992  1996 lung mass removed (gastrointestinal stromal tumor)  Started onGleevac 2002 3.Glevac was re started in January of 2013  No flowsheet data found.  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  Patient also has chronic diarrhea.  Recently was admitted in the hospital for peripheral vascular disease and percutaneous angioplasty was done.  Here for further follow-up and treatment consideration.  Anemia is stable.   REVIEW OF SYSTEMS:   Gen. status: Patient is in wheelchair.  He continues to feel somewhat better. Cardiovascular system: No chest pain.  No palpitation.  No paroxysmal nocturnal dyspnea. HEENT: No headache.  No hearing loss.  No ear pain.  No nosebleed or congestion.  No sore throat.  No difficulty swallowing Respiratory system: No cough.  No hemoptysis.  No shortness of breath at rest or exertion.  No chest pain. Abdomen: Soft.  Liver and spleen not palpable no ascites Lower extremity edema.  Weakness.  Had peripheral O disease and nonhealing ulcer which has been recently operated by vascular surgeon Neurological system: Residual hemiplegia Skin: No evidence of ecchymosis or rash.    As per HPI. Otherwise, a complete review of systems is negatve.  PAST MEDICAL HISTORY: Past Medical History  Diagnosis Date  . Asthma   . Cancer   . Anxiety   . Hypertension   .  Hyperlipidemia   . CVA (cerebral infarction)   . Paroxysmal a-fib     PAST SURGICAL HISTORY: No past surgical history on file.  FAMILY HISTORY There is no significant family history of breast cancer, ovarian cancer, colon cancer GYNECOLOGIC HISTORY:  No LMP recorded.     ADVANCED DIRECTIVES:  Patient does have advanced health care directive  HEALTH MAINTENANCE: History  Substance Use Topics  . Smoking status: Never Smoker   . Smokeless tobacco: Never Used  . Alcohol Use: Not on file       Allergies  Allergen Reactions  . Ibuprofen Nausea Only  . Motrin Pm [Ibuprofen-Diphenhydramine Cit] Other (See Comments)  . Tylenol [Acetaminophen] Other (See Comments)    Current Outpatient Prescriptions  Medication Sig Dispense Refill  . albuterol (PROVENTIL HFA;VENTOLIN HFA) 108 (90 BASE) MCG/ACT inhaler Inhale 2 puffs into the lungs every 4 (four) hours as needed for wheezing or shortness of breath.    Marland Kitchen aspirin 325 MG tablet Take 325 mg by mouth daily.    . brimonidine (ALPHAGAN) 0.15 % ophthalmic solution Place 1 drop into both eyes 2 (two) times daily.    . brinzolamide (AZOPT) 1 % ophthalmic suspension Place 1 drop into both eyes 2 (two) times daily.    . diazepam (VALIUM) 5 MG tablet Take one tablet by mouth every 12 hours as needed for anxiety/nervousness. 60 tablet 5  . digoxin (LANOXIN) 0.125 MG tablet Take 0.125 mg by mouth every other day.    . diphenoxylate-atropine (LOMOTIL) 2.5-0.025 MG per tablet Take two tablets by mouth four times  daily as needed for diarrhea 60 tablet 0  . ergocalciferol (VITAMIN D2) 50000 UNITS capsule Take 50,000 Units by mouth once a week.    . Fluticasone-Salmeterol (ADVAIR) 100-50 MCG/DOSE AEPB Inhale 1 puff into the lungs 2 (two) times daily.    Marland Kitchen imatinib (GLEEVEC) 400 MG tablet Take 400 mg by mouth daily. Take with meals and large glass of water.Caution:Chemotherapy.    . latanoprost (XALATAN) 0.005 % ophthalmic solution Place 1 drop into  both eyes at bedtime.    . metolazone (ZAROXOLYN) 5 MG tablet Take 5 mg by mouth 2 (two) times a week. 2 times weekly on Tuesday and Fri    . montelukast (SINGULAIR) 10 MG tablet Take 10 mg by mouth at bedtime.    . theophylline (THEODUR) 200 MG 12 hr tablet Take 200 mg by mouth 2 (two) times daily.    Marland Kitchen torsemide (DEMADEX) 20 MG tablet Take 20 mg by mouth every other day.     . traMADol (ULTRAM) 50 MG tablet Take two tablets by mouth three times daily as needed for pain 180 tablet 5  . iron polysaccharides (NIFEREX) 150 MG capsule Take by mouth.    . polycarbophil (FIBERCON) 625 MG tablet Take 625 mg by mouth daily.    . potassium chloride SA (K-DUR,KLOR-CON) 20 MEQ tablet Take 40 mEq by mouth daily.    . predniSONE (DELTASONE) 10 MG tablet Take by mouth.    Marland Kitchen PREVIDENT 5000 DRY MOUTH 1.1 % GEL dental gel   12   No current facility-administered medications for this visit.    OBJECTIVE:  Filed Vitals:   09/26/14 1547  BP: 122/77  Pulse: 76     Body mass index is 21.88 kg/(m^2).    ECOG FS:2 - Symptomatic, <50% confined to bed  PHYSICAL EXAM: Gen. status: Patient is in wheelchair.  Difficulty in ambulation because of previous neurological symptoms Head exam was generally normal. There was no scleral icterus or corneal arcus. Mucous membranes were moist. Lungs were clear to auscultation and percussion, and with normal diaphragmatic excursion. No wheezes or rales were noted.  Cardiac exam revealed the PMI to be normally situated and sized. The rhythm was regular and no extrasystoles were noted during several minutes of auscultation. The first and second heart sounds were normal and physiologic splitting of the second heart sound was noted. There were no murmurs, rubs, clicks, or gallops.\ Abdominal exam revealed normal bowel sounds. The abdomen was soft, non-tender, and without masses, organomegaly, or appreciable enlargement of the abdominal aorta. Lower extremity: Nonhealing ulcers.   Edema 1+. Neurological system: Residual weakness on the left side Examination of the skin revealed no evidence of significant rashes, suspicious appearing nevi or other concerning lesions.    LAB RESULTS:  Appointment on 09/26/2014  Component Date Value Ref Range Status  . WBC 09/26/2014 5.5  3.6 - 11.0 K/uL Final  . RBC 09/26/2014 3.54* 3.80 - 5.20 MIL/uL Final  . Hemoglobin 09/26/2014 10.1* 12.0 - 16.0 g/dL Final  . HCT 09/26/2014 31.2* 35.0 - 47.0 % Final  . MCV 09/26/2014 88.1  80.0 - 100.0 fL Final  . MCH 09/26/2014 28.6  26.0 - 34.0 pg Final  . MCHC 09/26/2014 32.5  32.0 - 36.0 g/dL Final  . RDW 09/26/2014 14.1  11.5 - 14.5 % Final  . Platelets 09/26/2014 137* 150 - 440 K/uL Final  . Neutrophils Relative % 09/26/2014 59   Final  . Neutro Abs 09/26/2014 3.3  1.4 - 6.5 K/uL Final  .  Lymphocytes Relative 09/26/2014 26   Final  . Lymphs Abs 09/26/2014 1.4  1.0 - 3.6 K/uL Final  . Monocytes Relative 09/26/2014 10   Final  . Monocytes Absolute 09/26/2014 0.5  0.2 - 0.9 K/uL Final  . Eosinophils Relative 09/26/2014 4   Final  . Eosinophils Absolute 09/26/2014 0.2  0 - 0.7 K/uL Final  . Basophils Relative 09/26/2014 1   Final  . Basophils Absolute 09/26/2014 0.0  0 - 0.1 K/uL Final  . Sodium 09/26/2014 139  135 - 145 mmol/L Final  . Potassium 09/26/2014 3.3* 3.5 - 5.1 mmol/L Final  . Chloride 09/26/2014 99* 101 - 111 mmol/L Final  . CO2 09/26/2014 33* 22 - 32 mmol/L Final  . Glucose, Bld 09/26/2014 98  65 - 99 mg/dL Final  . BUN 09/26/2014 31* 6 - 20 mg/dL Final  . Creatinine, Ser 09/26/2014 1.69* 0.44 - 1.00 mg/dL Final  . Calcium 09/26/2014 8.3* 8.9 - 10.3 mg/dL Final  . Total Protein 09/26/2014 6.3* 6.5 - 8.1 g/dL Final  . Albumin 09/26/2014 4.0  3.5 - 5.0 g/dL Final  . AST 09/26/2014 35  15 - 41 U/L Final  . ALT 09/26/2014 17  14 - 54 U/L Final  . Alkaline Phosphatase 09/26/2014 31* 38 - 126 U/L Final  . Total Bilirubin 09/26/2014 0.5  0.3 - 1.2 mg/dL Final  . GFR calc  non Af Amer 09/26/2014 28* >60 mL/min Final  . GFR calc Af Amer 09/26/2014 32* >60 mL/min Final   Comment: (NOTE) The eGFR has been calculated using the CKD EPI equation. This calculation has not been validated in all clinical situations. eGFR's persistently <60 mL/min signify possible Chronic Kidney Disease.   . Anion gap 09/26/2014 7  5 - 15 Final      STUDIES: No results found.  ASSESSMENT: 81 year old lady with history of gastrointestinal stromal tumor and metastases to liver and lung.  Stable disease. Anemia stable Chronic diarrhea is stable Peripheral vascular disease and recent vascular interventions in in January of 2016  MEDICAL DECISION MAKING:  Lab data has been reviewed.  Recent hospitalization has been reviewed.  On clinical examination there is no evidence of progressive gastrointestinal stromal disease. Continue believe at 400 mg daily HYPOKALEMAI Patient expressed understanding and was in agreement with this plan. She also understands that She can call clinic at any time with any questions, concerns, or com plaints.    No matching staging information was found for the patient.  Forest Gleason, MD   10/01/2014 8:10 AM

## 2014-11-11 ENCOUNTER — Telehealth: Payer: Self-pay | Admitting: *Deleted

## 2014-11-14 ENCOUNTER — Emergency Department: Payer: Medicare Other

## 2014-11-14 ENCOUNTER — Emergency Department
Admission: EM | Admit: 2014-11-14 | Discharge: 2014-11-14 | Disposition: A | Discharge status: Other Acute Inpatient Hospital | Payer: Medicare Other | Attending: Emergency Medicine | Admitting: Emergency Medicine

## 2014-11-14 ENCOUNTER — Encounter: Payer: Self-pay | Admitting: Emergency Medicine

## 2014-11-14 DIAGNOSIS — N3289 Other specified disorders of bladder: Secondary | ICD-10-CM | POA: Diagnosis not present

## 2014-11-14 DIAGNOSIS — N938 Other specified abnormal uterine and vaginal bleeding: Secondary | ICD-10-CM | POA: Diagnosis not present

## 2014-11-14 DIAGNOSIS — Z7982 Long term (current) use of aspirin: Secondary | ICD-10-CM | POA: Insufficient documentation

## 2014-11-14 DIAGNOSIS — I10 Essential (primary) hypertension: Secondary | ICD-10-CM | POA: Insufficient documentation

## 2014-11-14 DIAGNOSIS — Z79899 Other long term (current) drug therapy: Secondary | ICD-10-CM | POA: Insufficient documentation

## 2014-11-14 DIAGNOSIS — R310 Gross hematuria: Secondary | ICD-10-CM | POA: Insufficient documentation

## 2014-11-14 DIAGNOSIS — R1030 Lower abdominal pain, unspecified: Secondary | ICD-10-CM

## 2014-11-14 DIAGNOSIS — N939 Abnormal uterine and vaginal bleeding, unspecified: Secondary | ICD-10-CM

## 2014-11-14 HISTORY — DX: Disorder of kidney and ureter, unspecified: N28.9

## 2014-11-14 LAB — COMPREHENSIVE METABOLIC PANEL
ALBUMIN: 4.1 g/dL (ref 3.5–5.0)
ALT: 18 U/L (ref 14–54)
AST: 44 U/L — ABNORMAL HIGH (ref 15–41)
Alkaline Phosphatase: 29 U/L — ABNORMAL LOW (ref 38–126)
Anion gap: 7 (ref 5–15)
BUN: 35 mg/dL — ABNORMAL HIGH (ref 6–20)
CHLORIDE: 108 mmol/L (ref 101–111)
CO2: 26 mmol/L (ref 22–32)
Calcium: 8.7 mg/dL — ABNORMAL LOW (ref 8.9–10.3)
Creatinine, Ser: 1.61 mg/dL — ABNORMAL HIGH (ref 0.44–1.00)
GFR calc Af Amer: 34 mL/min — ABNORMAL LOW (ref >60)
GFR, EST NON AFRICAN AMERICAN: 29 mL/min — AB (ref >60)
Glucose, Bld: 133 mg/dL — ABNORMAL HIGH (ref 65–99)
Potassium: 4.5 mmol/L (ref 3.5–5.1)
Sodium: 141 mmol/L (ref 135–145)
Total Bilirubin: 0.7 mg/dL (ref 0.3–1.2)
Total Protein: 6.3 g/dL — ABNORMAL LOW (ref 6.5–8.1)

## 2014-11-14 LAB — CBC WITH DIFFERENTIAL/PLATELET
BASOS ABS: 0 10*3/uL (ref 0–0.1)
BASOS PCT: 1 %
Basophils Absolute: 0 10*3/uL (ref 0–0.1)
EOS ABS: 0 10*3/uL (ref 0–0.7)
EOS PCT: 1 %
Eosinophils Absolute: 0 10*3/uL (ref 0–0.7)
HCT: 24.9 % — ABNORMAL LOW (ref 35.0–47.0)
HCT: 19.8 % — ABNORMAL LOW (ref 35.0–47.0)
Hemoglobin: 8 g/dL — ABNORMAL LOW (ref 12.0–16.0)
Hemoglobin: 6.4 g/dL — ABNORMAL LOW (ref 12.0–16.0)
LYMPHS ABS: 1 10*3/uL (ref 1.0–3.6)
Lymphocytes Relative: 24 %
Lymphocytes Relative: 14 %
Lymphs Abs: 1.6 10*3/uL (ref 1.0–3.6)
MCH: 28.4 pg (ref 26.0–34.0)
MCH: 28.5 pg (ref 26.0–34.0)
MCHC: 32.3 g/dL (ref 32.0–36.0)
MCHC: 32.1 g/dL (ref 32.0–36.0)
MCV: 88 fL (ref 80.0–100.0)
MCV: 88.8 fL (ref 80.0–100.0)
MONO ABS: 0.7 10*3/uL (ref 0.2–0.9)
Monocytes Absolute: 0.6 10*3/uL (ref 0.2–0.9)
Monocytes Relative: 10 %
Monocytes Relative: 10 %
Neutro Abs: 4.2 10*3/uL (ref 1.4–6.5)
Neutro Abs: 5.1 10*3/uL (ref 1.4–6.5)
Neutrophils Relative %: 64 %
Neutrophils Relative %: 76 %
PLATELETS: 87 10*3/uL — AB (ref 150–440)
Platelets: 105 10*3/uL — ABNORMAL LOW (ref 150–440)
RBC: 2.83 MIL/uL — ABNORMAL LOW (ref 3.80–5.20)
RBC: 2.23 MIL/uL — ABNORMAL LOW (ref 3.80–5.20)
RDW: 13 % (ref 11.5–14.5)
RDW: 12.9 % (ref 11.5–14.5)
WBC: 6.4 10*3/uL (ref 3.6–11.0)
WBC: 6.8 10*3/uL (ref 3.6–11.0)

## 2014-11-14 LAB — FIBRINOGEN: Fibrinogen: 146 mg/dL — ABNORMAL LOW (ref 210–470)

## 2014-11-14 LAB — APTT: aPTT: 29 seconds (ref 24–36)

## 2014-11-14 LAB — PROTIME-INR
INR: 1.22
PROTHROMBIN TIME: 15.6 s — AB (ref 11.4–15.0)

## 2014-11-14 LAB — ABO/RH: ABO/RH(D): B POS

## 2014-11-14 LAB — PREPARE RBC (CROSSMATCH)

## 2014-11-14 MED ORDER — MORPHINE SULFATE 4 MG/ML IJ SOLN
4.0000 mg | Freq: Once | INTRAMUSCULAR | Status: AC
  Administered 2014-11-14: 4 mg via INTRAVENOUS

## 2014-11-14 MED ORDER — NORETHINDRONE ACETATE 5 MG PO TABS
10.0000 mg | ORAL_TABLET | ORAL | Status: DC
  Filled 2014-11-14 (×7): qty 2

## 2014-11-14 MED ORDER — ONDANSETRON HCL 4 MG/2ML IJ SOLN
INTRAMUSCULAR | Status: AC
  Administered 2014-11-14: 4 mg via INTRAVENOUS
  Filled 2014-11-14: qty 2

## 2014-11-14 MED ORDER — MORPHINE SULFATE 4 MG/ML IJ SOLN
INTRAMUSCULAR | Status: AC
  Filled 2014-11-14: qty 1

## 2014-11-14 MED ORDER — MORPHINE SULFATE 4 MG/ML IJ SOLN
INTRAMUSCULAR | Status: AC
  Administered 2014-11-14: 4 mg via INTRAVENOUS
  Filled 2014-11-14: qty 1

## 2014-11-14 MED ORDER — MEDROXYPROGESTERONE ACETATE 10 MG PO TABS
10.0000 mg | ORAL_TABLET | ORAL | Status: DC
  Administered 2014-11-14: 10 mg via ORAL
  Filled 2014-11-14 (×7): qty 1

## 2014-11-14 MED ORDER — SODIUM CHLORIDE 0.9 % IV BOLUS (SEPSIS)
1000.0000 mL | Freq: Once | INTRAVENOUS | Status: AC
  Administered 2014-11-14: 1000 mL via INTRAVENOUS

## 2014-11-14 MED ORDER — IOHEXOL 300 MG/ML  SOLN
80.0000 mL | Freq: Once | INTRAMUSCULAR | Status: AC | PRN
  Administered 2014-11-14: 80 mL via INTRAVENOUS

## 2014-11-14 MED ORDER — SODIUM CHLORIDE 0.9 % IV SOLN: 10.0000 mL/h | Freq: Once | INTRAVENOUS | Status: DC

## 2014-11-14 MED ORDER — ONDANSETRON HCL 4 MG/2ML IJ SOLN
4.0000 mg | Freq: Once | INTRAMUSCULAR | Status: AC
  Administered 2014-11-14: 4 mg via INTRAVENOUS

## 2014-11-14 NOTE — ED Notes (Signed)
Pt cleansed of saturated bloody brief.

## 2014-11-14 NOTE — ED Notes (Signed)
Pt cleansed of near bloody saturation of brief. Fresh brief placed.

## 2014-11-14 NOTE — ED Notes (Signed)
Patient transported to CT 

## 2014-11-14 NOTE — ED Notes (Signed)
Bedside ultrasound completed. With GYN MD at the bedside

## 2014-11-14 NOTE — ED Notes (Signed)
Report to stephanie, rn

## 2014-11-14 NOTE — ED Provider Notes (Signed)
Discussed with urology who requested a Foley catheter for the patient and gentle irrigation of bladder, not continuous bladder irrigation. I have discussed the case several times to Dr. Ilda Basset OB/GYN who has arranged transfer to [facility] through the ER. Family has been instructed of this, she is getting a blood transfusion for her anemia which is dropping due to her bleeding here.  Earleen Newport, MD 11/14/14 [phone]

## 2014-11-14 NOTE — ED Provider Notes (Signed)
[facility] Emergency Department Provider Note  ____________________________________________  Time seen: Approximately 243 AM  I have reviewed the triage vital signs and the nursing notes.   HISTORY  Chief Complaint Vaginal Bleeding    HPI [name] is a 81 y.o. female who comes in with vaginal bleeding and pain in the bottom of her stomach. The patient reports she saw Dr. Leonides Schanz on Wednesday and they attempted to do a pelvic exam but due to the patient's scar tissue from radiation she was unable to get one performed. The patient reports that there is a plan for an endometrial biopsy and an ultrasound which is scheduled for this week. The patient reports that after her visit on Wednesday she was doing well until Friday when she developed some increased vaginal bleeding. The patient reports that she soaking through the diapers that she typically wears. She reports though that tonight at 1900 the bleeding continued to intensify and she developed some lower abdominal pain. The patient also reports he started progesterone today as they plan to treat her bleeding like endometrial cancer.   Past Medical History  Diagnosis Date  . Asthma   . Cancer   . Anxiety   . Hypertension   . Hyperlipidemia   . CVA (cerebral infarction)   . Paroxysmal a-fib   . Renal insufficiency     Patient Active Problem List   Diagnosis Date Noted  . Physical deconditioning 06/09/2014  . Asthma with acute exacerbation 06/09/2014  . Acute on chronic diastolic congestive heart failure 06/09/2014  . Heartburn 06/09/2014  . Stage 2 skin ulcer of sacral region 06/09/2014  . Ischemic toe ulcer 06/09/2014  . Paroxysmal atrial fibrillation 06/09/2014  . Essential hypertension 06/09/2014  . Gastrointestinal stromal tumor (GIST) 06/09/2014  . Chronic diarrhea 06/09/2014    History reviewed. No pertinent past surgical history.  Current Outpatient Rx  Name  Route  Sig  Dispense  Refill   . imatinib (GLEEVEC) 400 MG tablet   Oral   Take 400 mg by mouth daily. Take with meals and large glass of water.Caution:Chemotherapy.         Marland Kitchen albuterol (PROVENTIL HFA;VENTOLIN HFA) 108 (90 BASE) MCG/ACT inhaler   Inhalation   Inhale 2 puffs into the lungs every 4 (four) hours as needed for wheezing or shortness of breath.         Marland Kitchen aspirin 325 MG tablet   Oral   Take 325 mg by mouth daily.         . brimonidine (ALPHAGAN) 0.15 % ophthalmic solution   Both Eyes   Place 1 drop into both eyes 2 (two) times daily.         . brinzolamide (AZOPT) 1 % ophthalmic suspension   Both Eyes   Place 1 drop into both eyes 2 (two) times daily.         . diazepam (VALIUM) 5 MG tablet      Take one tablet by mouth every 12 hours as needed for anxiety/nervousness.   60 tablet   5   . digoxin (LANOXIN) 0.125 MG tablet   Oral   Take 0.125 mg by mouth every other day.         . diphenoxylate-atropine (LOMOTIL) 2.5-0.025 MG per tablet      Take two tablets by mouth four times daily as needed for diarrhea   60 tablet   0   . ergocalciferol (VITAMIN D2) 50000 UNITS capsule   Oral   Take 50,000  Units by mouth once a week.         . Fluticasone-Salmeterol (ADVAIR) 100-50 MCG/DOSE AEPB   Inhalation   Inhale 1 puff into the lungs 2 (two) times daily.         . iron polysaccharides (NIFEREX) 150 MG capsule   Oral   Take by mouth.         . latanoprost (XALATAN) 0.005 % ophthalmic solution   Both Eyes   Place 1 drop into both eyes at bedtime.         . metolazone (ZAROXOLYN) 5 MG tablet   Oral   Take 5 mg by mouth 2 (two) times a week. 2 times weekly on Tuesday and Fri         . montelukast (SINGULAIR) 10 MG tablet   Oral   Take 10 mg by mouth at bedtime.         . polycarbophil (FIBERCON) 625 MG tablet   Oral   Take 625 mg by mouth daily.         . potassium chloride SA (K-DUR,KLOR-CON) 20 MEQ tablet   Oral   Take 40 mEq by mouth daily.         .  predniSONE (DELTASONE) 10 MG tablet   Oral   Take by mouth.         Marland Kitchen PREVIDENT 5000 DRY MOUTH 1.1 % GEL dental gel            12     Dispense as written.   . theophylline (THEODUR) 200 MG 12 hr tablet   Oral   Take 200 mg by mouth 2 (two) times daily.         Marland Kitchen torsemide (DEMADEX) 20 MG tablet   Oral   Take 20 mg by mouth every other day.          . traMADol (ULTRAM) 50 MG tablet      Take two tablets by mouth three times daily as needed for pain   180 tablet   5     Allergies Ibuprofen; Motrin pm; and Tylenol  History reviewed. No pertinent family history.  Social History History  Substance Use Topics  . Smoking status: Never Smoker   . Smokeless tobacco: Never Used  . Alcohol Use: Not on file    Review of Systems Constitutional: No fever/chills Eyes: No visual changes. ENT: No sore throat. Cardiovascular: Denies chest pain. Respiratory: Denies shortness of breath. Gastrointestinal: abdominal pain.  No nausea, no vomiting.  No diarrhea.  No constipation. Genitourinary: Negative for dysuria. Musculoskeletal: Negative for back pain. Skin: Negative for rash. Neurological: Negative for headaches, focal weakness or numbness.  10-point ROS otherwise negative.  ____________________________________________   PHYSICAL EXAM:  VITAL SIGNS: ED Triage Vitals  Enc Vitals Group     BP 11/14/14 0227 151/89 mmHg     Pulse Rate 11/14/14 0227 108     Resp 11/14/14 0227 25     Temp 11/14/14 0227 99 F (37.2 C)     Temp Source 11/14/14 0227 Oral     SpO2 11/14/14 0227 100 %     Weight 11/14/14 0227 162 lb (73.483 kg)     Height 11/14/14 0227 5\' 5"  (1.651 m)     Head Cir --      Peak Flow --      Pain Score 11/14/14 0350 5     Pain Loc --      Pain Edu? --  Excl. in [place]? --     Constitutional: Alert and oriented, pale. ill appearing and inmoderate distress. Eyes: Conjunctivae are normal. PERRL. EOMI. Head: Atraumatic. Nose: No  congestion/rhinnorhea. Mouth/Throat: Mucous membranes are moist.  Oropharynx non-erythematous. Hematological/Lymphatic/Immunilogical: No cervical lymphadenopathy. Cardiovascular: Normal rate, regular rhythm. Grossly normal heart sounds.  Good peripheral circulation. Respiratory: Normal respiratory effort.  No retractions. Lungs CTAB. Gastrointestinal: Soft with lower abd tenderness to palpation. No distention. No abdominal bruits. No CVA tenderness. Genitourinary: vaginal bleeding, did not attempt pelvic due to history of pelvic scarring Musculoskeletal: No lower extremity tenderness nor edema.  No joint effusions. Neurologic:  Normal speech and language. No gross focal neurologic deficits are appreciated.  Skin:  Skin is warm, dry and intact. No rash noted. Psychiatric: Mood and affect are normal.   ____________________________________________   LABS (all labs ordered are listed, but only abnormal results are displayed)  Labs Reviewed  COMPREHENSIVE METABOLIC PANEL - Abnormal; Notable for the following:    Glucose, Bld 133 (*)    BUN 35 (*)    Creatinine, Ser 1.61 (*)    Calcium 8.7 (*)    Total Protein 6.3 (*)    AST 44 (*)    Alkaline Phosphatase 29 (*)    GFR calc non Af Amer 29 (*)    GFR calc Af Amer 34 (*)    All other components within normal limits  CBC WITH DIFFERENTIAL/PLATELET - Abnormal; Notable for the following:    RBC 2.83 (*)    Hemoglobin 8.0 (*)    HCT 24.9 (*)    Platelets 105 (*)    All other components within normal limits  PROTIME-INR - Abnormal; Notable for the following:    Prothrombin Time 15.6 (*)    All other components within normal limits  FIBRINOGEN - Abnormal; Notable for the following:    Fibrinogen 146 (*)    All other components within normal limits  APTT  CBC WITH DIFFERENTIAL/PLATELET  TYPE AND SCREEN  ABO/RH  PREPARE RBC (CROSSMATCH)   ____________________________________________  EKG  ED ECG REPORT I, Loney Hering, the  attending physician, personally viewed and interpreted this ECG.   Date: 11/14/2014  EKG Time: 233  Rate: 103  Rhythm: sinus tachycardia  Axis: Left  Intervals:none  ST&T Change: none  ____________________________________________  RADIOLOGY  CT abdomen and pelvis: There is a large mass in the pelvis which I believe is within the bladder and therefore probably represents a bladder mas or possible large bladder hematoma. There is asymmetric delayed nephrogram on the left with mild bilateral hydronephrosis ____________________________________________   PROCEDURES  Procedure(s) performed: None  Critical Care performed: No  ____________________________________________   INITIAL IMPRESSION / ASSESSMENT AND PLAN / ED COURSE  Pertinent labs & imaging results that were available during my care of the patient were reviewed by me and considered in my medical decision making (see chart for details).  This is a 81 year old female who comes in with vaginal bleeding which has been going on for over one month. The patient has some significantly increased pain and bleeding. The patient's H&H is decreased. I will give her a liter of normal saline IV for her tachycardia and contact GYN for further evaluation and management.  ----------------------------------------- 7:11 AM on 11/14/2014 -----------------------------------------  I contacted Dr. Ilda Basset of GYN to determine what the next best step for the patient will be. 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 Dr. Ilda Basset suggests doing an ultrasound if possible and I will also give the patient 2 units of blood for her anemia. The patient will be seen and evaluated by Dr. Ilda Basset of the emergency department and will follow his recommendations for management.  ----------------------------------------- 8:32 AM on  11/14/2014 -----------------------------------------  Dr. Ilda Basset and initially felt that the patient needed to be admitted to the hospital. He feels that the patient needs further evaluation and treatment since she is continuing to bleed. Dr. Ilda Basset reports that he did see her ultrasound as they were performing it and initially wanted her admitted to the medicine service. Medicine felt that the patient better served with OB/GYN. Dr. Ilda Basset reports that he is attempting to arrange a transfer for the patient to be seen with GYN oncology at [facility]. The patient's care was signed out to Dr. Jimmye Norman who will follow-up the recommendations by Dr. Ilda Basset. ____________________________________________   FINAL CLINICAL IMPRESSION(S) / ED DIAGNOSES  Final diagnoses:  Vaginal bleeding  Lower abdominal pain      Loney Hering, MD 11/14/14 [phone]

## 2014-11-14 NOTE — Telephone Encounter (Signed)
Attempted to call Dr. Leonides Schanz, left voicemail for Dr. Leonides Schanz to call back.

## 2014-11-14 NOTE — ED Notes (Signed)
Pt repositioned in bed for comfort.

## 2014-11-14 NOTE — ED Notes (Signed)
Report given to [facility] with [facility]

## 2014-11-14 NOTE — Consult Note (Addendum)
Obstetrics & Gynecology Consultation Note  Date of Consultation: 11/14/2014   Requesting Provider: [facility] ER;  Dr. [name]  Primary OBGYN: [facility], Dr. Leonides Schanz Primary Oncologist: Dr. Jeb Levering Primary Care Provider: [facility]  Reason for Consultation: Acute vaginal bleeding, anemia  History of Present Illness: [name] is a 81 y.o. G1P1 (LMP: postemenopausal) with the above CC. PMHx significant for h/o GIST and CRC and liver mets with h/o chemotherapy and XRT, HTN, CHF, cardiomyopathy, paroxysmal A-fib, CVA, asthma and chronic respiratory failure, chronic diarrhea, h/o hip and knee replacements  Patient seen by Dr. Leonides Schanz on 7/6 for new patient consult for two month history of post menopausal bleeding. Exam limited by co-morbidities but no bleeding in vaginal vault and ?cervix noted to be flush with vagina with no definitive os noted with possible candidate not patent with attempting to pass biopsy pipelle and unable to use speculum Patient was put on high dose oral provera.  Starting 7/8 in the morning the patient started having BRB, which she states she is certain is vaginal and continued throughout the weekend with pain starting Sunday night, which caused her to be brought to the ER. She started the bid megace on Sunday  No dysuria (just pressure), hematuria, chest pain, SOB, melena   ROS: A 12-point review of systems was performed and negative, except as stated in the above HPI.  OBGYN History: As per HPI.   Past Medical History: Past Medical History  Diagnosis Date  . Asthma   . Cancer   . Anxiety   . Hypertension   . Hyperlipidemia   . CVA (cerebral infarction)   . Paroxysmal a-fib   . Renal insufficiency     Past Surgical History: as per HPI, colon resection Family History:  History reviewed. No pertinent family history.  Social History:  History   Social History  . Marital Status: Widowed    Spouse Name: N/A  . Number of Children: N/A  . Years of  Education: N/A   Occupational History  . Not on file.   Social History Main Topics  . Smoking status: Never Smoker   . Smokeless tobacco: Never Used  . Alcohol Use: Not on file  . Drug Use: No  . Sexual Activity: Not on file   Other Topics Concern  . Not on file   Social History Narrative     Allergy: Allergies  Allergen Reactions  . Ibuprofen Nausea Only  . Motrin Pm [Ibuprofen-Diphenhydramine Cit] Other (See Comments)  . Tylenol [Acetaminophen] Other (See Comments)    Current Outpatient Medications:  (Not in a hospital admission)   Hospital Medications: Current Facility-Administered Medications  Medication Dose Route Frequency Provider Last Rate Last Dose  . 0.9 %  sodium chloride infusion  10 mL/hr Intravenous Once Loney Hering, MD       Current Outpatient Prescriptions  Medication Sig Dispense Refill  . imatinib (GLEEVEC) 400 MG tablet Take 400 mg by mouth daily. Take with meals and large glass of water.Caution:Chemotherapy.    Marland Kitchen albuterol (PROVENTIL HFA;VENTOLIN HFA) 108 (90 BASE) MCG/ACT inhaler Inhale 2 puffs into the lungs every 4 (four) hours as needed for wheezing or shortness of breath.    Marland Kitchen aspirin 325 MG tablet Take 325 mg by mouth daily.    . brimonidine (ALPHAGAN) 0.15 % ophthalmic solution Place 1 drop into both eyes 2 (two) times daily.    . brinzolamide (AZOPT) 1 % ophthalmic suspension Place 1 drop into both eyes 2 (two) times daily.    Marland Kitchen  diazepam (VALIUM) 5 MG tablet Take one tablet by mouth every 12 hours as needed for anxiety/nervousness. 60 tablet 5  . digoxin (LANOXIN) 0.125 MG tablet Take 0.125 mg by mouth every other day.    . diphenoxylate-atropine (LOMOTIL) 2.5-0.025 MG per tablet Take two tablets by mouth four times daily as needed for diarrhea 60 tablet 0  . ergocalciferol (VITAMIN D2) 50000 UNITS capsule Take 50,000 Units by mouth once a week.    . Fluticasone-Salmeterol (ADVAIR) 100-50 MCG/DOSE AEPB Inhale 1 puff into the lungs 2  (two) times daily.    . iron polysaccharides (NIFEREX) 150 MG capsule Take by mouth.    . latanoprost (XALATAN) 0.005 % ophthalmic solution Place 1 drop into both eyes at bedtime.    . metolazone (ZAROXOLYN) 5 MG tablet Take 5 mg by mouth 2 (two) times a week. 2 times weekly on Tuesday and Fri    . montelukast (SINGULAIR) 10 MG tablet Take 10 mg by mouth at bedtime.    . polycarbophil (FIBERCON) 625 MG tablet Take 625 mg by mouth daily.    . potassium chloride SA (K-DUR,KLOR-CON) 20 MEQ tablet Take 40 mEq by mouth daily.    . predniSONE (DELTASONE) 10 MG tablet Take by mouth.    Marland Kitchen PREVIDENT 5000 DRY MOUTH 1.1 % GEL dental gel   12  . theophylline (THEODUR) 200 MG 12 hr tablet Take 200 mg by mouth 2 (two) times daily.    Marland Kitchen torsemide (DEMADEX) 20 MG tablet Take 20 mg by mouth every other day.     . traMADol (ULTRAM) 50 MG tablet Take two tablets by mouth three times daily as needed for pain 180 tablet 5     Physical Exam:   Current Vital Signs 24h Vital Sign Ranges  T 98 F (36.7 C) Temp  Avg: 98.5 F (36.9 C)  Min: 98 F (36.7 C)  Max: 99 F (37.2 C)  BP 128/86 mmHg BP  Min: 124/72  Max: 156/91  HR 90 Pulse  Avg: 104.6  Min: 89  Max: 123  RR 18 Resp  Avg: 18  Min: 11  Max: 25  SaO2 96 % Not Delivered SpO2  Avg: 98.6 %  Min: 96 %  Max: 100 %       24 Hour I/O Current Shift I/O  Time Ins Outs       Patient Vitals for the past 12 hrs:  BP Temp Temp src Pulse Resp SpO2 Height Weight  11/14/14 0630 128/86 mmHg - - - - - - -  11/14/14 0600 124/72 mmHg - - 90 - 96 % - -  11/14/14 0545 - - - 94 - 97 % - -  11/14/14 0530 129/83 mmHg - - 89 - 97 % - -  11/14/14 0515 - - - 91 - 99 % - -  11/14/14 0430 (!) 138/58 mmHg - - (!) 112 - 100 % - -  11/14/14 0415 - - - (!) 123 - 100 % - -  11/14/14 0400 (!) 148/100 mmHg - - (!) 115 - 98 % - -  11/14/14 0345 - - - 98 - 100 % - -  11/14/14 0330 (!) 156/91 mmHg - - (!) 108 - 98 % - -  11/14/14 0315 - - - (!) 105 - 100 % - -  11/14/14 0300 (!)  152/93 mmHg - - (!) 110 18 97 % - -  11/14/14 0245 - - - (!) 118 18 100 % - -  11/14/14  0230 (!) 141/86 mmHg 98 F (36.7 C) - (!) 104 11 99 % - -  11/14/14 0227 (!) 151/89 mmHg 99 F (37.2 C) Oral (!) 108 (!) 25 100 % 5\' 5"  (1.651 m) 162 lb (73.483 kg)    Body mass index is 26.96 kg/(m^2). General appearance: Well nourished, well developed female in no acute distress.  Neck:  Supple, normal appearance, and no thyromegaly  Cardiovascular:Regular rate and rhythm.  No murmurs, rubs or gallops. Mildly tachy to the 100s-110s Respiratory:  Clear to auscultation bilateral. Normal respiratory effort Abdomen: positive bowel sounds and no masses, hernias; diffusely non tender to palpation, non distended Neuro/Psych:  Normal mood and affect.  Skin:  Warm and dry.  Lymphatic:  No inguinal lymphadenopathy.   Pelvic exam: is limited by body habitus EGBUS: within normal limits with chux pad that is about 1hr's old with about 10x10 area of old and bright red blood on it. Old blood on transvaginal probe at the end of the u/s exam  Laboratory:  Recent Labs Lab 11/14/14 0241  WBC 6.4  HGB 8.0*  HCT 24.9*  PLT 105*    Recent Labs Lab 11/14/14 0241  NA 141  K 4.5  CL 108  CO2 26  BUN 35*  CREATININE 1.61*  CALCIUM 8.7*  PROT 6.3*  BILITOT 0.7  ALKPHOS 29*  ALT 18  AST 44*  GLUCOSE 133*    Recent Labs Lab 11/14/14 0243  APTT 29  INR 1.22  Fibrinogen 146  B pos  Imaging:  EXAM: CT ABDOMEN AND PELVIS WITH CONTRAST  TECHNIQUE: Multidetector CT imaging of the abdomen and pelvis was performed using the standard protocol following bolus administration of intravenous contrast.  CONTRAST: 19mL OMNIPAQUE IOHEXOL 300 MG/ML SOLN  COMPARISON: 04/19/2013  FINDINGS: Atelectasis in the lung bases. Mild cardiac enlargement.  Changes of hepatic cirrhosis with large lateral segment left lobe and caudate lobe of the liver. Atrophy versus previous resection of the  posterior right lobe of the liver. Cystic lesion with central calcification centrally in the left lobe of the liver. Appearance is similar to previous study. Mild intrahepatic bile duct dilatation may be postoperative. Gallbladder appears to be surgically absent. Pancreas is atrophic. Spleen is atrophic. No adrenal gland nodules. Kidneys are somewhat small. There is an asymmetric delayed nephrogram on the left kidney with mild hydronephrosis. Cause is not identified. Mild hydronephrosis on the right kidney. No solid renal lesions identified. Calcification of abdominal aorta and iliac vessels without aneurysm. Inferior vena cava is unremarkable. No significant retroperitoneal lymphadenopathy. Diffuse infiltration throughout the subcutaneous and mesenteric fat consistent with edema. No significant free fluid or free air in the abdomen. There is a cystic collection adjacent to the intrahepatic inferior vena cava which may represent a pericardial cyst. This is unchanged since prior study. Stomach, small bowel, and colon appear decompressed although the colon is stool filled.  Pelvis: Visualization is limited due to streak artifact from a right hip prosthesis. There is a mass in the pelvis which appears to be within the bladder although this could represent the uterine mass compressing the bladder inferiorly. This lesion measures about 7.8 by 8.4 cm. Sonographic correlation may be useful. No free or loculated pelvic fluid collections. No significant lymphadenopathy. Appendix is not identified. Degenerative changes in the lumbar spine with postoperative posterior laminectomies at L4-5 and L5-S1 levels. Mild anterior subluxation of L3 on L4 is likely degenerative. No destructive bone lesions are appreciated. Postoperative changes with right hip arthroplasty.  IMPRESSION: There  is a large mass in the pelvis which I believe is within the bladder and therefore probably represents a bladder  mass or possibly large bladder hematoma. There is asymmetric delayed nephrogram on the left with mild bilateral hydronephrosis. Possible ectatic cirrhosis with previous resection or atrophy of the posterior right lobe of the liver. Gallbladder appears to be surgically absent. Bile duct dilatation is probably physiologic. Cystic lesion with central calcification centrally in the left lobe of the liver is unchanged since prior study.   Electronically Signed  By: Lucienne Capers M.D.  On: 11/14/2014 05:22  Ultrasound: I was present for the exam, in the room. On my viewing of the images, there appears to be a 10x7cm mass that is either inside the bladder or abutting the bladder and anterior to the uterus; there appears to be a distinct plane b/w the uterus and mass. The mass doesn't appear to have increased doppler flow and looks homogenous and has stromal/smooth muscle/fibroid like appearance. On transvaginal u/s, small (7cm) uterus that appears normal and ovaries not able to be distinctly. Final read is pending  Assessment: Ms. [name] is a 81 y.o. with PMB and large pelvic mass and anemia  Plan: *VB: will do northindrone 10mg  q4h PO and repeat CBC to see if blood counts are stable, prior to two units PRBCs. *Pelvic mass: difficult to tell if inside or just abutting the bladder but does seem to be distinct from the uterus. Patient and daughters giving history and it sounds like they have a h/o GI tumors but nothing GYN, and I d/w them possibility of leiomyosarcoma, given h/o radiation and type of VB that she's having. Told patient and family that she is likely best served at  [facility] care center for further work up, as they have [place] and Urology Onc there, with possibility of needing biopsy to determine primary source of mass *CV: stable; 2U PRBCs for tachycardia *Heme: 2UPRBCs ordered. Repeat CBC prior to check trend. Fibrinogen low but no baseline to compare. Will continue to monitor  bleeding. PO progestin ordered (se above) *FEN/GI: diet as tolerated, 2U PRBCs and gentle IVFs after, given heart history *Pain: none *Dispo: either admit to [facility] or transfer to [facility]; transfer center called  Durene Romans. [place] Pager [phone]  11/14/14 @ [phone]: discussed case with Dr. Vick Frees GYN Onc and will do ER to ER transfer. Discussed case with family and patient and they are amenable to transfer. New chux has new 10x10 area of old and bright red blood through it. Radiologist believes possible contrast extravasation from bladder. D/w Dr. Jimmye Norman in the [facility] ER and will have Urology come by and see, as well. No aygestin in the hospital so will try provera but even if it can help with the bleeding, it will take several hours at least to help slow any uterine bleeding, but patient may need an acute intervention, either surgery or repeat RT which is complicated by her prior RT history, to help slow the bleeding; both of which aren't the best options here. Will continue to follow and sign off given to Dr. Leonides Schanz.   Durene Romans MD [facility]  Pager: [phone]   7/11 @ [phone]  Recent Labs Lab 11/14/14 0241 11/14/14 0836  WBC 6.4 6.8  HGB 8.0* 6.4*  HCT 24.9* 19.8*  PLT 105* 87*  d/w ER physician Dr. Jimmye Norman and expedite patient transfer Discussed with radiology to try and get CD's to patient prior to discharge to [facility]  Durene Romans MD [facility]  Pager: [phone]

## 2014-11-14 NOTE — ED Notes (Signed)
OB/GYN MD in with pt at present for exam.

## 2014-11-14 NOTE — ED Notes (Signed)
Patient presents to Emergency Department via EMS with complaints of bleeding from vaginal area for the last month.  EMS reports rectal and liver cancer in 3532, dt complicqations pt has had no hysterectomy.  Pt abdomen is distended.

## 2014-11-15 ENCOUNTER — Telehealth: Payer: Self-pay | Admitting: *Deleted

## 2014-11-15 DIAGNOSIS — D638 Anemia in other chronic diseases classified elsewhere: Secondary | ICD-10-CM

## 2014-11-15 DIAGNOSIS — C49A0 Gastrointestinal stromal tumor, unspecified site: Secondary | ICD-10-CM

## 2014-11-15 LAB — TYPE AND SCREEN
ABO/RH(D): B POS
Antibody Screen: NEGATIVE
UNIT DIVISION: 0
Unit division: 0
Unit division: 0

## 2014-11-15 NOTE — Telephone Encounter (Signed)
FYI

## 2014-11-15 NOTE — Telephone Encounter (Signed)
Spoke with [name] and told her Dr C wants to see [name] in 2 weeks with labs and to get path report form [facility]. [facility] ereports that no biopsy has been done as of yet, but they are going to try cysto tomorrow

## 2014-11-15 NOTE — Telephone Encounter (Signed)
Open new encounter in error

## 2014-11-16 NOTE — Addendum Note (Signed)
Addended by: Betti Cruz on: 11/16/2014 02:34 PM   Modules accepted: Orders

## 2014-11-30 ENCOUNTER — Ambulatory Visit: Payer: Medicare Other | Admitting: Oncology

## 2014-11-30 ENCOUNTER — Other Ambulatory Visit: Payer: Medicare Other

## 2014-12-15 ENCOUNTER — Ambulatory Visit: Payer: Medicare Other | Admitting: Oncology

## 2014-12-15 ENCOUNTER — Inpatient Hospital Stay: Payer: Medicare Other | Attending: Oncology | Admitting: Oncology

## 2014-12-15 ENCOUNTER — Other Ambulatory Visit: Payer: Medicare Other

## 2014-12-15 ENCOUNTER — Inpatient Hospital Stay: Payer: Medicare Other

## 2014-12-15 VITALS — BP 113/73 | HR 76 | Temp 97.0°F | Wt 131.0 lb

## 2014-12-15 DIAGNOSIS — F419 Anxiety disorder, unspecified: Secondary | ICD-10-CM | POA: Diagnosis not present

## 2014-12-15 DIAGNOSIS — C49A0 Gastrointestinal stromal tumor, unspecified site: Secondary | ICD-10-CM

## 2014-12-15 DIAGNOSIS — E785 Hyperlipidemia, unspecified: Secondary | ICD-10-CM

## 2014-12-15 DIAGNOSIS — I739 Peripheral vascular disease, unspecified: Secondary | ICD-10-CM | POA: Diagnosis not present

## 2014-12-15 DIAGNOSIS — Z8673 Personal history of transient ischemic attack (TIA), and cerebral infarction without residual deficits: Secondary | ICD-10-CM | POA: Diagnosis not present

## 2014-12-15 DIAGNOSIS — C7800 Secondary malignant neoplasm of unspecified lung: Secondary | ICD-10-CM | POA: Diagnosis not present

## 2014-12-15 DIAGNOSIS — I4891 Unspecified atrial fibrillation: Secondary | ICD-10-CM | POA: Insufficient documentation

## 2014-12-15 DIAGNOSIS — I10 Essential (primary) hypertension: Secondary | ICD-10-CM | POA: Diagnosis not present

## 2014-12-15 DIAGNOSIS — N2889 Other specified disorders of kidney and ureter: Secondary | ICD-10-CM | POA: Insufficient documentation

## 2014-12-15 DIAGNOSIS — C787 Secondary malignant neoplasm of liver and intrahepatic bile duct: Secondary | ICD-10-CM

## 2014-12-15 DIAGNOSIS — C494 Malignant neoplasm of connective and soft tissue of abdomen: Secondary | ICD-10-CM | POA: Diagnosis not present

## 2014-12-15 DIAGNOSIS — Z7982 Long term (current) use of aspirin: Secondary | ICD-10-CM | POA: Insufficient documentation

## 2014-12-15 DIAGNOSIS — J45909 Unspecified asthma, uncomplicated: Secondary | ICD-10-CM | POA: Insufficient documentation

## 2014-12-15 DIAGNOSIS — D649 Anemia, unspecified: Secondary | ICD-10-CM | POA: Insufficient documentation

## 2014-12-15 DIAGNOSIS — K769 Liver disease, unspecified: Secondary | ICD-10-CM | POA: Diagnosis not present

## 2014-12-15 DIAGNOSIS — N1330 Unspecified hydronephrosis: Secondary | ICD-10-CM | POA: Diagnosis not present

## 2014-12-15 DIAGNOSIS — Z79899 Other long term (current) drug therapy: Secondary | ICD-10-CM | POA: Insufficient documentation

## 2014-12-15 DIAGNOSIS — R197 Diarrhea, unspecified: Secondary | ICD-10-CM | POA: Insufficient documentation

## 2014-12-15 DIAGNOSIS — R7989 Other specified abnormal findings of blood chemistry: Secondary | ICD-10-CM

## 2014-12-15 DIAGNOSIS — D638 Anemia in other chronic diseases classified elsewhere: Secondary | ICD-10-CM

## 2014-12-15 LAB — COMPREHENSIVE METABOLIC PANEL
ALBUMIN: 4.4 g/dL (ref 3.5–5.0)
ALK PHOS: 35 U/L — AB (ref 38–126)
ALT: 27 U/L (ref 14–54)
AST: 45 U/L — AB (ref 15–41)
Anion gap: 7 (ref 5–15)
BUN: 27 mg/dL — ABNORMAL HIGH (ref 6–20)
CO2: 29 mmol/L (ref 22–32)
Calcium: 8.5 mg/dL — ABNORMAL LOW (ref 8.9–10.3)
Chloride: 105 mmol/L (ref 101–111)
Creatinine, Ser: 1.26 mg/dL — ABNORMAL HIGH (ref 0.44–1.00)
GFR calc Af Amer: 46 mL/min — ABNORMAL LOW (ref >60)
GFR, EST NON AFRICAN AMERICAN: 39 mL/min — AB (ref >60)
Glucose, Bld: 93 mg/dL (ref 65–99)
POTASSIUM: 3.8 mmol/L (ref 3.5–5.1)
SODIUM: 141 mmol/L (ref 135–145)
TOTAL PROTEIN: 6.7 g/dL (ref 6.5–8.1)
Total Bilirubin: 0.7 mg/dL (ref 0.3–1.2)

## 2014-12-15 LAB — CBC WITH DIFFERENTIAL/PLATELET
Basophils Absolute: 0.1 10*3/uL (ref 0–0.1)
Basophils Relative: 1 %
Eosinophils Absolute: 0.1 10*3/uL (ref 0–0.7)
Eosinophils Relative: 1 %
HCT: 29.9 % — ABNORMAL LOW (ref 35.0–47.0)
Hemoglobin: 9.8 g/dL — ABNORMAL LOW (ref 12.0–16.0)
Lymphocytes Relative: 13 %
Lymphs Abs: 1 10*3/uL (ref 1.0–3.6)
MCH: 29.2 pg (ref 26.0–34.0)
MCHC: 32.8 g/dL (ref 32.0–36.0)
MCV: 89.2 fL (ref 80.0–100.0)
Monocytes Absolute: 0.7 10*3/uL (ref 0.2–0.9)
Monocytes Relative: 9 %
Neutro Abs: 5.5 10*3/uL (ref 1.4–6.5)
Neutrophils Relative %: 76 %
Platelets: 170 10*3/uL (ref 150–440)
RBC: 3.35 MIL/uL — ABNORMAL LOW (ref 3.80–5.20)
RDW: 13.9 % (ref 11.5–14.5)
WBC: 7.2 10*3/uL (ref 3.6–11.0)

## 2014-12-15 NOTE — Progress Notes (Signed)
Patient does not have living will.  Never smoked. 

## 2014-12-16 ENCOUNTER — Encounter: Payer: Self-pay | Admitting: Oncology

## 2014-12-16 NOTE — Progress Notes (Signed)
[place] @ [facility] [phone]  Fax:([phone]) [place]: 02/08/1934  MR#: [id number]  CNO#:[id number]  Patient Care Team: No Pcp Per Patient as PCP - General (General Practice)  CHIEF COMPLAINT:  Chief Complaint  Patient presents with  . Follow-up     No history exists.  gastrointestinal stromal tumor, diagnosis.  In 1985 (or rectal cancer removed, was gastrointestinal stromal tumor) liver resection in 1989  Recurrence , in the lung.  Status  post resecti and radiation, 1992  1996 lung mass removed (gastrointestinal stromal tumor)  Started onGleevac 2002 3.Glevac was re started in January of 2013 4.  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  Patient also has chronic diarrhea.  Recently was admitted in the hospital for peripheral vascular disease and percutaneous angioplasty was done.  Here for further follow-up and treatment consideration.  Anemia is stable.. December 15, 2014 Patient was in the emergency room with hematuria CT scan revealed a bladder mass patient underwent cystoscopy at [facility] and was found to have B blood clot.  Cystoscopy did not reveal any evidence of malignancy.  Patient is not bleeding anymore.  Chemistry of diarrhea. Extensive coagulation workup at [facility] was unremarkable.  Patient had been taken off [place].  CT scan done at [facility] has been reviewed.  There was no evidence of metastases.  CT scandone at  [facility] has been reviewed independently O evidence of progressing disease   REVIEW OF SYSTEMS:   Gen. status: Patient is in wheelchair.  He continues to feel somewhat better. Cardiovascular system: No chest pain.  No palpitation.  No paroxysmal nocturnal dyspnea. HEENT: No  headache.  No hearing loss.  No ear pain.  No nosebleed or congestion.  No sore throat.  No difficulty swallowing Respiratory system: No cough.  No hemoptysis.  No shortness of breath at rest or exertion.  No chest pain. Abdomen: Soft.  Liver and spleen not palpable no ascites Lower extremity edema.  Weakness.  Had peripheral O disease and nonhealing ulcer which has been recently operated by vascular surgeon Neurological system: Residual hemiplegia Skin: No evidence of ecchymosis or rash.    As per HPI. Otherwise, a complete review of systems is negatve.  PAST MEDICAL HISTORY: Past Medical History  Diagnosis Date  . Asthma   . Cancer   . Anxiety   . Hypertension   . Hyperlipidemia   . CVA (cerebral infarction)   . Paroxysmal a-fib   . Renal insufficiency     PAST SURGICAL HISTORY: Previous history of rectal surgery liver resection and lung resection for gastrointestinal stromal tumor  FAMILY HISTORY There is no significant family history of breast cancer, ovarian cancer, colon cancer     ADVANCED DIRECTIVES:  Patient does have advanced health care directive  HEALTH MAINTENANCE: Social History  Substance Use Topics  . Smoking status: Never Smoker   . Smokeless tobacco: Never Used  . Alcohol Use: None       Allergies  Allergen Reactions  . Ibuprofen Nausea Only  . Motrin Pm [Ibuprofen-Diphenhydramine Cit] Other (See Comments)  . Tylenol [Acetaminophen] Other (See Comments)    Pt states tylenol is contraindicated because she takes [place]    Current Outpatient Prescriptions  Medication Sig Dispense  Refill  . albuterol (PROVENTIL HFA;VENTOLIN HFA) 108 (90 BASE) MCG/ACT inhaler Inhale 2 puffs into the lungs every 4 (four) hours as needed for wheezing or shortness of breath.    Marland Kitchen aspirin 325 MG tablet Take 325 mg by mouth daily.    . brimonidine (ALPHAGAN) 0.15 % ophthalmic solution Place 1 drop into both eyes 2 (two) times daily.    . brinzolamide (AZOPT) 1 %  ophthalmic suspension Place 1 drop into both eyes 2 (two) times daily.    . budesonide-formoterol (SYMBICORT) 160-4.5 MCG/ACT inhaler Inhale into the lungs.    . diazepam (VALIUM) 5 MG tablet Take one tablet by mouth every 12 hours as needed for anxiety/nervousness. 60 tablet 5  . digoxin (LANOXIN) 0.125 MG tablet Take 0.125 mg by mouth every other day.    . diphenoxylate-atropine (LOMOTIL) 2.5-0.025 MG per tablet Take two tablets by mouth four times daily as needed for diarrhea 60 tablet 0  . ergocalciferol (VITAMIN D2) 50000 UNITS capsule Take 50,000 Units by mouth once a week.     . Fluticasone-Salmeterol (ADVAIR) 500-50 MCG/DOSE AEPB Inhale 1 puff into the lungs 2 (two) times daily.    Marland Kitchen imatinib (GLEEVEC) 400 MG tablet Take 400 mg by mouth daily. Take with meals and large glass of water.Caution:Chemotherapy.    . iron polysaccharides (NIFEREX) 150 MG capsule Take by mouth.    . latanoprost (XALATAN) 0.005 % ophthalmic solution Place 1 drop into both eyes at bedtime.    . megestrol (MEGACE) 40 MG tablet Take 80 mg by mouth 2 (two) times daily.     . metolazone (ZAROXOLYN) 5 MG tablet Take 5 mg by mouth 2 (two) times a week. 2 times weekly on Tuesday and Fri    . Misc Natural Products (GLUCOS-CHONDROIT-MSM COMPLEX PO) Take 2 tablets by mouth 3 (three) times daily.    . montelukast (SINGULAIR) 10 MG tablet Take 10 mg by mouth at bedtime.    . polycarbophil (FIBERCON) 625 MG tablet Take 625 mg by mouth daily.    . potassium chloride SA (K-DUR,KLOR-CON) 20 MEQ tablet Take 40 mEq by mouth daily.    . predniSONE (DELTASONE) 10 MG tablet Take 10 mg by mouth daily.     Marland Kitchen PREVIDENT 5000 DRY MOUTH 1.1 % GEL dental gel Place 1 application onto teeth at bedtime.   12  . theophylline (THEODUR) 200 MG 12 hr tablet Take 200 mg by mouth 2 (two) times daily.    Marland Kitchen torsemide (DEMADEX) 20 MG tablet Take 40 mg by mouth. Every Tuesday and Friday    . traMADol (ULTRAM) 50 MG tablet Take two tablets by mouth three  times daily as needed for pain (Patient taking differently: Take 100 mg by mouth at bedtime as needed. Take two tablets by mouth three times daily as needed for pain) 180 tablet 5   No current facility-administered medications for this visit.    OBJECTIVE:  Filed Vitals:   12/15/14 1147  BP: 113/73  Pulse: 76  Temp: 97 F (36.1 C)     Body mass index is 21.8 kg/(m^2).    ECOG FS:2 - Symptomatic, <50% confined to bed  PHYSICAL EXAM: Gen. status: Patient is in wheelchair.  Difficulty in ambulation because of previous neurological symptoms Head exam was generally normal. There was no scleral icterus or corneal arcus. Mucous membranes were moist. Lungs were clear to auscultation and percussion, and with normal diaphragmatic excursion. No wheezes or rales were noted.  Cardiac exam revealed the PMI  to be normally situated and sized. The rhythm was regular and no extrasystoles were noted during several minutes of auscultation. The first and second heart sounds were normal and physiologic splitting of the second heart sound was noted. There were no murmurs, rubs, clicks, or gallops.\ Abdominal exam revealed normal bowel sounds. The abdomen was soft, non-tender, and without masses, organomegaly, or appreciable enlargement of the abdominal aorta. Lower extremity: Nonhealing ulcers.  Edema 1+. Neurological system: Residual weakness on the left side Examination of the skin revealed no evidence of significant rashes, suspicious appearing nevi or other concerning lesions.    LAB RESULTS:  Appointment on 12/15/2014  Component Date Value Ref Range Status  . WBC 12/15/2014 7.2  3.6 - 11.0 K/uL Final  . RBC 12/15/2014 3.35* 3.80 - 5.20 MIL/uL Final  . Hemoglobin 12/15/2014 9.8* 12.0 - 16.0 g/dL Final  . HCT 12/15/2014 29.9* 35.0 - 47.0 % Final  . MCV 12/15/2014 89.2  80.0 - 100.0 fL Final  . MCH 12/15/2014 29.2  26.0 - 34.0 pg Final  . MCHC 12/15/2014 32.8  32.0 - 36.0 g/dL Final  . RDW  12/15/2014 13.9  11.5 - 14.5 % Final  . Platelets 12/15/2014 170  150 - 440 K/uL Final  . Neutrophils Relative % 12/15/2014 76   Final  . Neutro Abs 12/15/2014 5.5  1.4 - 6.5 K/uL Final  . Lymphocytes Relative 12/15/2014 13   Final  . Lymphs Abs 12/15/2014 1.0  1.0 - 3.6 K/uL Final  . Monocytes Relative 12/15/2014 9   Final  . Monocytes Absolute 12/15/2014 0.7  0.2 - 0.9 K/uL Final  . Eosinophils Relative 12/15/2014 1   Final  . Eosinophils Absolute 12/15/2014 0.1  0 - 0.7 K/uL Final  . Basophils Relative 12/15/2014 1   Final  . Basophils Absolute 12/15/2014 0.1  0 - 0.1 K/uL Final  . Sodium 12/15/2014 141  135 - 145 mmol/L Final  . Potassium 12/15/2014 3.8  3.5 - 5.1 mmol/L Final  . Chloride 12/15/2014 105  101 - 111 mmol/L Final  . CO2 12/15/2014 29  22 - 32 mmol/L Final  . Glucose, Bld 12/15/2014 93  65 - 99 mg/dL Final  . BUN 12/15/2014 27* 6 - 20 mg/dL Final  . Creatinine, Ser 12/15/2014 1.26* 0.44 - 1.00 mg/dL Final  . Calcium 12/15/2014 8.5* 8.9 - 10.3 mg/dL Final  . Total Protein 12/15/2014 6.7  6.5 - 8.1 g/dL Final  . Albumin 12/15/2014 4.4  3.5 - 5.0 g/dL Final  . AST 12/15/2014 45* 15 - 41 U/L Final  . ALT 12/15/2014 27  14 - 54 U/L Final  . Alkaline Phosphatase 12/15/2014 35* 38 - 126 U/L Final  . Total Bilirubin 12/15/2014 0.7  0.3 - 1.2 mg/dL Final  . GFR calc non Af Amer 12/15/2014 39* >60 mL/min Final  . GFR calc Af Amer 12/15/2014 46* >60 mL/min Final   Comment: (NOTE) The eGFR has been calculated using the CKD EPI equation. This calculation has not been validated in all clinical situations. eGFR's persistently <60 mL/min signify possible Chronic Kidney Disease.   . Anion gap 12/15/2014 7  5 - 15 Final      STUDIES: CT scan done in the emergency room and has been reviewed independentlyIMPRESSION: There is a large mass in the pelvis which I believe is within the bladder and therefore probably represents a bladder mass or possibly large bladder hematoma.  There is asymmetric delayed nephrogram on the left with mild bilateral hydronephrosis. Possible  ectatic cirrhosis with previous resection or atrophy of the posterior right lobe of the liver. Gallbladder appears to be surgically absent. Bile duct dilatation is probably physiologic. Cystic lesion with central calcification centrally in the left lobe of the liver is unchangedIMPRESSION: There is a large mass in the pelvis which I believe is within the bladder and therefore probably represents a bladder mass or possibly large bladder hematoma. There is asymmetric delayed nephrogram on the left with mild bilateral hydronephrosis. Possible ectatic cirrhosis with previous resection or atrophy of the posterior right lobe of the liver. Gallbladder appears to be surgically absent. Bile duct dilatation is probably physiologic. Cystic lesion with central calcification centrally in the left lobe of the liver is unchanged ASSESSMENT: 81 year old lady with history of gastrointestinal stromal tumor and metastases to liver and lung.  Stable disease. Anemia stable Chronic diarrhea is stable Peripheral vascular disease and recent vascular interventions in in January of 2016 Patient recently had hematuria with bladder hematoma at cystoscopy at [facility] no evidence of primary malignancy has been found.  CT scan does not reveal any evidence of progressing gastrointestinal stromal tumor.  MEDICAL DECISION MAKING:  Lab data has been reviewed.  Anemia is stable iron studies were ordered. CT scan and ultrasound has been reviewed.  Plan to resume the Aleve at 400 mg daily  .    No matching staging information was found for the patient.  Forest Gleason, MD   12/16/2014 7:43 AM

## 2014-12-21 ENCOUNTER — Encounter: Payer: Self-pay | Admitting: Emergency Medicine

## 2014-12-21 ENCOUNTER — Ambulatory Visit: Payer: Medicare Other

## 2014-12-21 ENCOUNTER — Inpatient Hospital Stay
Admission: EM | Admit: 2014-12-21 | Discharge: 2014-12-22 | DRG: 292 | Disposition: A | Discharge status: Home / Self Care | Payer: Medicare Other | Attending: Internal Medicine | Admitting: Internal Medicine

## 2014-12-21 ENCOUNTER — Emergency Department: Payer: Medicare Other

## 2014-12-21 ENCOUNTER — Other Ambulatory Visit: Payer: Self-pay | Admitting: Specialist

## 2014-12-21 DIAGNOSIS — K529 Noninfective gastroenteritis and colitis, unspecified: Secondary | ICD-10-CM | POA: Diagnosis present

## 2014-12-21 DIAGNOSIS — I129 Hypertensive chronic kidney disease with stage 1 through stage 4 chronic kidney disease, or unspecified chronic kidney disease: Secondary | ICD-10-CM | POA: Diagnosis present

## 2014-12-21 DIAGNOSIS — I429 Cardiomyopathy, unspecified: Secondary | ICD-10-CM | POA: Diagnosis present

## 2014-12-21 DIAGNOSIS — Z9889 Other specified postprocedural states: Secondary | ICD-10-CM | POA: Diagnosis not present

## 2014-12-21 DIAGNOSIS — K219 Gastro-esophageal reflux disease without esophagitis: Secondary | ICD-10-CM | POA: Diagnosis present

## 2014-12-21 DIAGNOSIS — Z8673 Personal history of transient ischemic attack (TIA), and cerebral infarction without residual deficits: Secondary | ICD-10-CM | POA: Diagnosis not present

## 2014-12-21 DIAGNOSIS — Z85118 Personal history of other malignant neoplasm of bronchus and lung: Secondary | ICD-10-CM | POA: Diagnosis not present

## 2014-12-21 DIAGNOSIS — I5033 Acute on chronic diastolic (congestive) heart failure: Secondary | ICD-10-CM

## 2014-12-21 DIAGNOSIS — R748 Abnormal levels of other serum enzymes: Secondary | ICD-10-CM | POA: Diagnosis present

## 2014-12-21 DIAGNOSIS — Z888 Allergy status to other drugs, medicaments and biological substances status: Secondary | ICD-10-CM

## 2014-12-21 DIAGNOSIS — R778 Other specified abnormalities of plasma proteins: Secondary | ICD-10-CM

## 2014-12-21 DIAGNOSIS — N179 Acute kidney failure, unspecified: Secondary | ICD-10-CM | POA: Diagnosis present

## 2014-12-21 DIAGNOSIS — Z7982 Long term (current) use of aspirin: Secondary | ICD-10-CM

## 2014-12-21 DIAGNOSIS — R7989 Other specified abnormal findings of blood chemistry: Secondary | ICD-10-CM

## 2014-12-21 DIAGNOSIS — R319 Hematuria, unspecified: Secondary | ICD-10-CM | POA: Diagnosis present

## 2014-12-21 DIAGNOSIS — E785 Hyperlipidemia, unspecified: Secondary | ICD-10-CM | POA: Diagnosis present

## 2014-12-21 DIAGNOSIS — I340 Nonrheumatic mitral (valve) insufficiency: Secondary | ICD-10-CM | POA: Diagnosis present

## 2014-12-21 DIAGNOSIS — H409 Unspecified glaucoma: Secondary | ICD-10-CM | POA: Diagnosis present

## 2014-12-21 DIAGNOSIS — J45909 Unspecified asthma, uncomplicated: Secondary | ICD-10-CM | POA: Diagnosis present

## 2014-12-21 DIAGNOSIS — J449 Chronic obstructive pulmonary disease, unspecified: Secondary | ICD-10-CM | POA: Diagnosis present

## 2014-12-21 DIAGNOSIS — R600 Localized edema: Secondary | ICD-10-CM

## 2014-12-21 DIAGNOSIS — R32 Unspecified urinary incontinence: Secondary | ICD-10-CM | POA: Diagnosis present

## 2014-12-21 DIAGNOSIS — Z8249 Family history of ischemic heart disease and other diseases of the circulatory system: Secondary | ICD-10-CM

## 2014-12-21 DIAGNOSIS — I5023 Acute on chronic systolic (congestive) heart failure: Secondary | ICD-10-CM | POA: Diagnosis present

## 2014-12-21 DIAGNOSIS — R0602 Shortness of breath: Secondary | ICD-10-CM | POA: Diagnosis not present

## 2014-12-21 DIAGNOSIS — Z9660 Presence of unspecified orthopedic joint implant: Secondary | ICD-10-CM | POA: Diagnosis present

## 2014-12-21 DIAGNOSIS — Z79899 Other long term (current) drug therapy: Secondary | ICD-10-CM

## 2014-12-21 DIAGNOSIS — I480 Paroxysmal atrial fibrillation: Secondary | ICD-10-CM | POA: Diagnosis present

## 2014-12-21 DIAGNOSIS — N183 Chronic kidney disease, stage 3 (moderate): Secondary | ICD-10-CM | POA: Diagnosis present

## 2014-12-21 DIAGNOSIS — I509 Heart failure, unspecified: Secondary | ICD-10-CM

## 2014-12-21 DIAGNOSIS — J9801 Acute bronchospasm: Secondary | ICD-10-CM

## 2014-12-21 DIAGNOSIS — C169 Malignant neoplasm of stomach, unspecified: Secondary | ICD-10-CM | POA: Diagnosis present

## 2014-12-21 HISTORY — DX: Chronic obstructive pulmonary disease, unspecified: J44.9

## 2014-12-21 HISTORY — DX: Nonrheumatic mitral (valve) insufficiency: I34.0

## 2014-12-21 LAB — CBC
HEMATOCRIT: 30.5 % — AB (ref 35.0–47.0)
Hemoglobin: 9.9 g/dL — ABNORMAL LOW (ref 12.0–16.0)
MCH: 29.1 pg (ref 26.0–34.0)
MCHC: 32.5 g/dL (ref 32.0–36.0)
MCV: 89.6 fL (ref 80.0–100.0)
PLATELETS: 124 10*3/uL — AB (ref 150–440)
RBC: 3.4 MIL/uL — ABNORMAL LOW (ref 3.80–5.20)
RDW: 13.4 % (ref 11.5–14.5)
WBC: 7.1 10*3/uL (ref 3.6–11.0)

## 2014-12-21 LAB — BASIC METABOLIC PANEL
Anion gap: 12 (ref 5–15)
BUN: 30 mg/dL — ABNORMAL HIGH (ref 6–20)
CO2: 25 mmol/L (ref 22–32)
CREATININE: 1.92 mg/dL — AB (ref 0.44–1.00)
Calcium: 9.1 mg/dL (ref 8.9–10.3)
Chloride: 99 mmol/L — ABNORMAL LOW (ref 101–111)
GFR calc Af Amer: 27 mL/min — ABNORMAL LOW (ref >60)
GFR calc non Af Amer: 24 mL/min — ABNORMAL LOW (ref >60)
GLUCOSE: 109 mg/dL — AB (ref 65–99)
POTASSIUM: 3.2 mmol/L — AB (ref 3.5–5.1)
Sodium: 136 mmol/L (ref 135–145)

## 2014-12-21 LAB — DIGOXIN LEVEL: Digoxin Level: 0.9 ng/mL (ref 0.8–2.0)

## 2014-12-21 LAB — TROPONIN I: Troponin I: 0.28 ng/mL — ABNORMAL HIGH (ref <0.031)

## 2014-12-21 MED ORDER — SODIUM CHLORIDE 0.9 % IJ SOLN: 3.0000 mL | INTRAMUSCULAR | Status: DC | PRN

## 2014-12-21 MED ORDER — ALBUTEROL SULFATE (5 MG/ML) 0.5% IN NEBU: 2.5000 mg | INHALATION_SOLUTION | Freq: Four times a day (QID) | RESPIRATORY_TRACT | Status: DC

## 2014-12-21 MED ORDER — BRINZOLAMIDE 1 % OP SUSP
1.0000 [drp] | Freq: Two times a day (BID) | OPHTHALMIC | Status: DC
  Administered 2014-12-21 – 2014-12-22 (×2): 1 [drp] via OPHTHALMIC
  Filled 2014-12-21: qty 10

## 2014-12-21 MED ORDER — LEVALBUTEROL HCL 1.25 MG/3ML IN NEBU: 1.2500 mg | INHALATION_SOLUTION | Freq: Once | RESPIRATORY_TRACT | Status: AC

## 2014-12-21 MED ORDER — PREDNISONE 10 MG PO TABS
10.0000 mg | ORAL_TABLET | ORAL | Status: DC
  Administered 2014-12-22: 10 mg via ORAL
  Filled 2014-12-21: qty 1

## 2014-12-21 MED ORDER — IPRATROPIUM-ALBUTEROL 0.5-2.5 (3) MG/3ML IN SOLN: 3.0000 mL | Freq: Once | RESPIRATORY_TRACT | Status: DC

## 2014-12-21 MED ORDER — MONTELUKAST SODIUM 10 MG PO TABS: 10.0000 mg | ORAL_TABLET | Freq: Every day | ORAL | Status: DC

## 2014-12-21 MED ORDER — ACETAMINOPHEN 325 MG PO TABS: 650.0000 mg | ORAL_TABLET | Freq: Four times a day (QID) | ORAL | Status: DC | PRN

## 2014-12-21 MED ORDER — SODIUM CHLORIDE 0.9 % IV SOLN: 250.0000 mL | INTRAVENOUS | Status: DC | PRN

## 2014-12-21 MED ORDER — SENNOSIDES-DOCUSATE SODIUM 8.6-50 MG PO TABS: 1.0000 | ORAL_TABLET | Freq: Every evening | ORAL | Status: DC | PRN

## 2014-12-21 MED ORDER — THEOPHYLLINE ER 200 MG PO TB12
200.0000 mg | ORAL_TABLET | Freq: Two times a day (BID) | ORAL | Status: DC
  Administered 2014-12-22 (×2): 200 mg via ORAL
  Filled 2014-12-21 (×3): qty 1

## 2014-12-21 MED ORDER — ALBUTEROL SULFATE (2.5 MG/3ML) 0.083% IN NEBU: 5.0000 mg | INHALATION_SOLUTION | Freq: Once | RESPIRATORY_TRACT | Status: DC

## 2014-12-21 MED ORDER — LEVALBUTEROL HCL 1.25 MG/0.5ML IN NEBU: 1.2500 mg | INHALATION_SOLUTION | RESPIRATORY_TRACT | Status: DC | PRN

## 2014-12-21 MED ORDER — DIGOXIN 125 MCG PO TABS
0.1250 mg | ORAL_TABLET | Freq: Every day | ORAL | Status: DC
  Administered 2014-12-22: 0.125 mg via ORAL
  Filled 2014-12-21: qty 1

## 2014-12-21 MED ORDER — BUDESONIDE-FORMOTEROL FUMARATE 160-4.5 MCG/ACT IN AERO
2.0000 | INHALATION_SPRAY | Freq: Two times a day (BID) | RESPIRATORY_TRACT | Status: DC
  Administered 2014-12-22: 2 via RESPIRATORY_TRACT
  Filled 2014-12-21: qty 6

## 2014-12-21 MED ORDER — ENOXAPARIN SODIUM 30 MG/0.3ML ~~LOC~~ SOLN: 30.0000 mg | SUBCUTANEOUS | Status: DC

## 2014-12-21 MED ORDER — IPRATROPIUM-ALBUTEROL 0.5-2.5 (3) MG/3ML IN SOLN
RESPIRATORY_TRACT | Status: AC
  Filled 2014-12-21: qty 3

## 2014-12-21 MED ORDER — FUROSEMIDE 10 MG/ML IJ SOLN
20.0000 mg | Freq: Two times a day (BID) | INTRAMUSCULAR | Status: DC
  Administered 2014-12-22: 20 mg via INTRAVENOUS
  Filled 2014-12-21: qty 2

## 2014-12-21 MED ORDER — DIAZEPAM 5 MG PO TABS
5.0000 mg | ORAL_TABLET | Freq: Two times a day (BID) | ORAL | Status: DC | PRN
  Administered 2014-12-21: 5 mg via ORAL
  Filled 2014-12-21: qty 1

## 2014-12-21 MED ORDER — POLYSACCHARIDE IRON COMPLEX 150 MG PO CAPS
300.0000 mg | ORAL_CAPSULE | Freq: Every day | ORAL | Status: DC
  Administered 2014-12-21 – 2014-12-22 (×2): 300 mg via ORAL
  Filled 2014-12-21 (×2): qty 2

## 2014-12-21 MED ORDER — LATANOPROST 0.005 % OP SOLN
1.0000 [drp] | Freq: Every day | OPHTHALMIC | Status: DC
  Administered 2014-12-21: 1 [drp] via OPHTHALMIC
  Filled 2014-12-21: qty 2.5

## 2014-12-21 MED ORDER — LEVALBUTEROL HCL 1.25 MG/0.5ML IN NEBU
INHALATION_SOLUTION | RESPIRATORY_TRACT | Status: AC
  Administered 2014-12-21: 1.25 mg
  Filled 2014-12-21: qty 0.5

## 2014-12-21 MED ORDER — BISACODYL 5 MG PO TBEC: 5.0000 mg | DELAYED_RELEASE_TABLET | Freq: Every day | ORAL | Status: DC | PRN

## 2014-12-21 MED ORDER — ASPIRIN EC 325 MG PO TBEC
325.0000 mg | DELAYED_RELEASE_TABLET | Freq: Every day | ORAL | Status: DC
  Administered 2014-12-22: 325 mg via ORAL
  Filled 2014-12-21: qty 1

## 2014-12-21 MED ORDER — SODIUM CHLORIDE 0.9 % IJ SOLN
3.0000 mL | Freq: Two times a day (BID) | INTRAMUSCULAR | Status: DC
  Administered 2014-12-21 – 2014-12-22 (×2): 3 mL via INTRAVENOUS

## 2014-12-21 MED ORDER — FUROSEMIDE 10 MG/ML IJ SOLN
40.0000 mg | Freq: Once | INTRAMUSCULAR | Status: AC
  Administered 2014-12-21: 40 mg via INTRAVENOUS
  Filled 2014-12-21: qty 4

## 2014-12-21 MED ORDER — BRIMONIDINE TARTRATE 0.15 % OP SOLN
1.0000 [drp] | Freq: Two times a day (BID) | OPHTHALMIC | Status: DC
  Administered 2014-12-21 – 2014-12-22 (×2): 1 [drp] via OPHTHALMIC
  Filled 2014-12-21: qty 5

## 2014-12-21 MED ORDER — ACETAMINOPHEN 650 MG RE SUPP: 650.0000 mg | Freq: Four times a day (QID) | RECTAL | Status: DC | PRN

## 2014-12-21 MED ORDER — ASPIRIN 81 MG PO CHEW
324.0000 mg | CHEWABLE_TABLET | Freq: Once | ORAL | Status: AC
  Administered 2014-12-21: 324 mg via ORAL
  Filled 2014-12-21: qty 4

## 2014-12-21 MED ORDER — VITAMIN D (ERGOCALCIFEROL) 1.25 MG (50000 UNIT) PO CAPS
50000.0000 [IU] | ORAL_CAPSULE | ORAL | Status: DC
  Filled 2014-12-21: qty 1

## 2014-12-21 MED ORDER — IMATINIB MESYLATE 100 MG PO TABS
400.0000 mg | ORAL_TABLET | Freq: Every day | ORAL | Status: DC
  Filled 2014-12-21: qty 4

## 2014-12-21 NOTE — ED Notes (Signed)
Incontinent of urine.  Patient changed, skin care given.  Repositioned to be on right side.

## 2014-12-21 NOTE — ED Notes (Signed)
Turned.  Linen changed.  Skin care given. Patient tolerated well.

## 2014-12-21 NOTE — ED Notes (Signed)
Incontinent of Urine.  Patient changed.  Skin care given.  Repositioned onto left side.  Tolerated well

## 2014-12-21 NOTE — ED Notes (Signed)
Patient turned, linen changed, skin care given.  Positioned on left side.  Continue to monitor.

## 2014-12-21 NOTE — H&P (Signed)
[facility] NAME: [name]    MR#:  [id number]  DATE OF BIRTH:  20-Apr-1934  DATE OF ADMISSION:  12/21/2014  PRIMARY CARE PHYSICIAN: Dr Sarajane Jews  REQUESTING/REFERRING PHYSICIAN: Dr Reita Cliche  CHIEF COMPLAINT:  SOB and leg swelling  HISTORY OF PRESENT ILLNESS:  [name]  is a 81 y.o. female with a known history of CHF with EF of 40% with severe MR on echo, COPD, paroxysmal A. fib, history of CVA comes to the emergency room with increasing swelling in both lower extremity along with puffiness of her face and increased fatigability with weakness at home. Patient uses a walker. She lives at independent living facility at [facility]. She was seen by Dr. Raul Del yesterday increased her torsemide to twice a day for 3 days. Patient continued to feel short of breath came to the emergency room was found to have acute on chronic congestive heart failure systolic. She is being admitted for further eval showed management. She received a dose of IV Lasix. Bilateral lower extremity Doppler in the emergency room was negative. She was noted to have positive d-dimer dimer as outpatient. Patient was sent to the emergency room to rule out PE. She is currently not having any symptoms of pleuritic chest pain shortness of breath with sats 100% on room air. She appears more volume overloaded with CHF and acute PE. We'll order to get her VQ scan in the morning.  PAST MEDICAL HISTORY:   Past Medical History  Diagnosis Date  . Asthma   . Cancer   . Anxiety   . Hypertension   . Hyperlipidemia   . CVA (cerebral infarction)   . Paroxysmal a-fib   . Renal insufficiency   . COPD (chronic obstructive pulmonary disease)   . Mitral valve insufficiency     PAST SURGICAL HISTOIRY:   Past Surgical History  Procedure Laterality Date  . Back surgery    . Joint replacement      SOCIAL HISTORY:   Social History  Substance Use Topics  . Smoking  status: Never Smoker   . Smokeless tobacco: Never Used  . Alcohol Use: No    FAMILY HISTORY:  [facility]  DRUG ALLERGIES:   Allergies  Allergen Reactions  . Ibuprofen Other (See Comments)    Pt states that she can't take because of her kidneys.    . Motrin Pm [Ibuprofen-Diphenhydramine Cit] Other (See Comments)    Pt states that she can't take because of her kidneys.    . Tylenol [Acetaminophen] Other (See Comments)    Pt states that she can't take because it is contraindicated with her Gleevec.      REVIEW OF SYSTEMS:  Review of Systems  Constitutional: Positive for malaise/fatigue. Negative for fever, chills and diaphoresis.  HENT: Negative for congestion, ear pain, hearing loss, nosebleeds and sore throat.   Eyes: Negative for blurred vision, double vision, photophobia and pain.  Respiratory: Negative for hemoptysis, sputum production, wheezing and stridor.   Cardiovascular: Positive for leg swelling and PND. Negative for orthopnea and claudication.  Gastrointestinal: Negative for heartburn and abdominal pain.  Genitourinary: Negative for dysuria and frequency.  Musculoskeletal: Negative for back pain, joint pain and neck pain.  Skin: Negative for rash.  Neurological: Positive for weakness. Negative for tingling, sensory change, speech change, focal weakness, seizures and headaches.  Endo/Heme/Allergies: Does not bruise/bleed easily.  Psychiatric/Behavioral: Positive for suicidal ideas. Negative for memory loss. The patient is not nervous/anxious.  MEDICATIONS AT HOME:   Prior to Admission medications   Medication Sig Start Date End Date Taking? Authorizing Provider  aspirin EC 325 MG tablet Take 325 mg by mouth daily.   Yes Historical Provider, MD  brimonidine (ALPHAGAN) 0.15 % ophthalmic solution Place 1 drop into both eyes 2 (two) times daily.   Yes Historical Provider, MD  brinzolamide (AZOPT) 1 % ophthalmic suspension Place 1 drop into both eyes 2 (two) times daily.    Yes Historical Provider, MD  budesonide-formoterol (SYMBICORT) 160-4.5 MCG/ACT inhaler Inhale 2 puffs into the lungs 2 (two) times daily.   Yes Historical Provider, MD  diazepam (VALIUM) 5 MG tablet Take one tablet by mouth every 12 hours as needed for anxiety/nervousness. Patient taking differently: Take 5 mg by mouth every 12 (twelve) hours as needed for anxiety.  06/07/14  Yes Mahima Bubba Camp, MD  digoxin (LANOXIN) 0.125 MG tablet Take 0.125 mg by mouth daily.    Yes Historical Provider, MD  diphenoxylate-atropine (LOMOTIL) 2.5-0.025 MG per tablet Take two tablets by mouth four times daily as needed for diarrhea Patient taking differently: Take 2 tablets by mouth 4 (four) times daily as needed for diarrhea or loose stools.  06/14/14  Yes Estill Dooms, MD  imatinib (GLEEVEC) 100 MG tablet Take 400 mg by mouth daily.   Yes Historical Provider, MD  iron polysaccharides (NIFEREX) 150 MG capsule Take 300 mg by mouth daily.   Yes Historical Provider, MD  latanoprost (XALATAN) 0.005 % ophthalmic solution Place 1 drop into both eyes at bedtime.   Yes Historical Provider, MD  levalbuterol [facility] HFA) 45 MCG/ACT inhaler Inhale 2 puffs into the lungs every 4 (four) hours as needed for wheezing.   Yes Historical Provider, MD  levalbuterol (XOPENEX) 1.25 MG/3ML nebulizer solution Take 1.25 mg by nebulization every 6 (six) hours as needed for wheezing.   Yes Historical Provider, MD  metolazone (ZAROXOLYN) 5 MG tablet Take 5 mg by mouth 2 (two) times a week. Pt takes on Tuesday and Friday.   Yes Historical Provider, MD  montelukast (SINGULAIR) 10 MG tablet Take 10 mg by mouth at bedtime.   Yes Historical Provider, MD  predniSONE (DELTASONE) 10 MG tablet Take 10 mg by mouth every other day.   Yes Historical Provider, MD  theophylline (THEODUR) 200 MG 12 hr tablet Take 200 mg by mouth 2 (two) times daily with a meal.    Yes Historical Provider, MD  torsemide (DEMADEX) 20 MG tablet Take 40 mg by mouth 2 (two) times a  week. Pt takes on Tuesday and Friday.   Yes Historical Provider, MD  traMADol (ULTRAM) 50 MG tablet Take two tablets by mouth three times daily as needed for pain Patient taking differently: Take 50 mg by mouth every 8 (eight) hours as needed for moderate pain.  06/07/14  Yes Blanchie Serve, MD  Vitamin D, Ergocalciferol, (DRISDOL) 50000 UNITS CAPS capsule Take 50,000 Units by mouth every 7 (seven) days.   Yes Historical Provider, MD      VITAL SIGNS:  Blood pressure 114/75, pulse 97, temperature 98.2 F (36.8 C), temperature source Oral, resp. rate 20, height 5\' 5"  (1.651 m), weight 59.875 kg (132 lb), SpO2 100 %.  PHYSICAL EXAMINATION:  GENERAL:  81 y.o.-year-old patient lying in the bed with no acute distress. Anasarca+ EYES: Pupils equal, round, reactive to light and accommodation. No scleral icterus. Extraocular muscles intact.  HEENT: Head atraumatic, normocephalic. Oropharynx and nasopharynx clear.  NECK:  Supple, jugular venous distention++ No  thyroid enlargement, no tenderness.  LUNGS: decreased breath sounds bilaterally, no wheezing, rales,rhonchi or crepitation. No use of accessory muscles of respiration.  CARDIOVASCULAR: S1, S2 normal. No murmurs, rubs, or gallops.  ABDOMEN: Soft, nontender, nondistended. Bowel sounds present. No organomegaly or mass.  EXTREMITIES: pedal edema+++, no cyanosis, or clubbing.  NEUROLOGIC: Cranial nerves II through XII are intact. Muscle strength 4/5 in all extremities. Sensation intact. Gait not checked. weak PSYCHIATRIC: The patient is alert and oriented x 3.  SKIN: No obvious rash, lesion, or ulcer.   LABORATORY PANEL:   CBC  Recent Labs Lab 12/21/14 1503  WBC 7.1  HGB 9.9*  HCT 30.5*  PLT 124*   ------------------------------------------------------------------------------------------------------------------  Chemistries   Recent Labs Lab 12/15/14 1121 12/21/14 1503  NA 141 136  K 3.8 3.2*  CL 105 99*  CO2 29 25  GLUCOSE 93  109*  BUN 27* 30*  CREATININE 1.26* 1.92*  CALCIUM 8.5* 9.1  AST 45*  --   ALT 27  --   ALKPHOS 35*  --   BILITOT 0.7  --    ------------------------------------------------------------------------------------------------------------------  Cardiac Enzymes  Recent Labs Lab 12/21/14 1503  TROPONINI 0.28*   ------------------------------------------------------------------------------------------------------------------  RADIOLOGY:  Dg Chest 2 View  12/21/2014   CLINICAL DATA:  54-YEAR-OLD female with shortness of breath and wheezing since last week. Subsequent encounter.  EXAM: CHEST  2 VIEW  COMPARISON:  07/29/2013.  05/31/2014.  FINDINGS: Seated AP and lateral views of the chest. chronic severe cardiomegaly. calcified atherosclerosis of the aorta. stable lung volumes with eventration of the diaphragm. no pneumothorax or pulmonary edema. no pleural effusion or acute pulmonary opacity. multilevel mild compression fractures in the thoracic spine appears stable. No acute osseous abnormality identified. advanced degenerative changes at both shoulders.  IMPRESSION: Stable severe cardiomegaly.  No acute cardiopulmonary abnormality.   Electronically Signed   By: Genevie Ann M.D.   On: 12/21/2014 15:28   US Venous Img Lower Bilateral  12/21/2014   CLINICAL DATA:  Intermittent chronic bilateral lower extremity swelling. Initial encounter.  EXAM: BILATERAL LOWER EXTREMITY VENOUS DOPPLER ULTRASOUND  TECHNIQUE: Gray-scale sonography with graded compression, as well as color Doppler and duplex ultrasound were performed to evaluate the lower extremity deep venous systems from the level of the common femoral vein and including the common femoral, femoral, profunda femoral, popliteal and calf veins including the posterior tibial, peroneal and gastrocnemius veins when visible. The superficial great saphenous vein was also interrogated. Spectral Doppler was utilized to evaluate flow at rest and with distal  augmentation maneuvers in the common femoral, femoral and popliteal veins.  COMPARISON:  None.  FINDINGS: RIGHT LOWER EXTREMITY  Common Femoral Vein: No evidence of thrombus. Normal compressibility, respiratory phasicity and response to augmentation.  Saphenofemoral Junction: No evidence of thrombus. Normal compressibility and flow on color Doppler imaging.  Profunda Femoral Vein: No evidence of thrombus. Normal compressibility and flow on color Doppler imaging.  Femoral Vein: No evidence of thrombus. Normal compressibility, respiratory phasicity and response to augmentation.  Popliteal Vein: No evidence of thrombus. Normal compressibility, respiratory phasicity and response to augmentation.  Calf Veins: No evidence of thrombus. Normal compressibility and flow on color Doppler imaging.  Superficial Great Saphenous Vein: No evidence of thrombus. Normal compressibility and flow on color Doppler imaging.  Venous Reflux:  None.  Other Findings: Mild diffuse soft tissue edema is noted along the right popliteal region and calf.  LEFT LOWER EXTREMITY  Common Femoral Vein: No evidence of thrombus. Normal compressibility, respiratory phasicity  and response to augmentation.  Saphenofemoral Junction: No evidence of thrombus. Normal compressibility and flow on color Doppler imaging.  Profunda Femoral Vein: No evidence of thrombus. Normal compressibility and flow on color Doppler imaging.  Femoral Vein: No evidence of thrombus. Normal compressibility, respiratory phasicity and response to augmentation.  Popliteal Vein: No evidence of thrombus. Normal compressibility, respiratory phasicity and response to augmentation.  Calf Veins: No evidence of thrombus. Normal compressibility and flow on color Doppler imaging.  Superficial Great Saphenous Vein: No evidence of thrombus. Normal compressibility and flow on color Doppler imaging.  Venous Reflux:  None.  Other Findings: Mild diffuse soft tissue edema is noted along the left calf.   IMPRESSION: 1. No evidence of deep venous thrombosis. 2. Mild diffuse soft tissue edema along the right popliteal region and calf, and along the left calf.   Electronically Signed   By: Garald Balding M.D.   On: 12/21/2014 17:56    EKG:   SR with short PR interval and PVC's IMPRESSION AND PLAN:   81 year old 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 She was feeling extremely short of breath at home came to the emergency room and was found to have  1 acute on chronic systolic congestive heart failure. Patient's echo per Dr. Clayborn Bigness this year showed EF of 40% with severe MR and large right atrium. I will not repeat echo. Admit to telemetry floor IV Lasix 20 mg twice a day monitor I's and O's and monitor creatinine  2. Elevated d-dimer per Dr. Raul Del Bilateral lower extremity Doppler is negative. Given history of carcinoma we will do a VQ scan in the morning.  3. History of paroxysmal A. Fib Continue digoxin, aspirin.   4 acute on chronic CK D stage III Monitor I's and O's over nephrotoxin consider nephrology consultation if creatinine continues to rise.   5 history of glaucoma continue eyedrops  6 COPD continue Singulair and nebulizer along with inhalers. Appears stable  CODE STATUS full code Plan discussed with patient and daughters in the emergency room  Physical therapy and care management for discharge planning    All the records are reviewed and case discussed with ED provider. Management plans discussed with the patient, family and they are in agreement.  CODE STATUS: FULL  TOTAL TIME TAKING CARE OF THIS PATIENT: 50 minutes.    Jaye Polidori M.D on 12/21/2014 at 8:04 PM  Between 7am to 6pm - Pager - [phone]  After 6pm go to [email or web address] -  password [email or web address] [facility]  [facility]  Office  [phone]  CC: Primary care physician; No PCP Per Patient

## 2014-12-21 NOTE — ED Provider Notes (Signed)
[facility] Emergency Department Provider Note   ____________________________________________  Time seen: 3:30 PM I have reviewed the triage vital signs and the triage nursing note.  HISTORY  Chief Complaint Shortness of Breath   Historian Patient, and her daughters  HPI [name] is a 81 y.o. female who has a history of GI ST tumor, A. fib, asthma, and CHF, who is coming in for shortness of breath and wheezing. Symptoms started probably about 1 week ago and seemed to have been getting worse over this time period. No chest pain. No fever. No coughing. She was wearing oxygen only at night, and over the past 2 days she has increased wearing it all day and she is wearing 3 L. Yesterday she saw her pulmonologist Dr. Raul Del, who asked her to continue her torsemide and to add metalozone x 3 days.  She was called today that her ddimer was elevated and she needed to do u/s to rule out DVT. She was feeling very short of breath or scan to the emergency department. Symptoms are moderate. She has had increased lower extremity edema severe over the past 1 week. She normally uses Xopenex rather than albuterol to try to limit heart racing.    Past Medical History  Diagnosis Date  . Asthma   . Cancer   . Anxiety   . Hypertension   . Hyperlipidemia   . CVA (cerebral infarction)   . Paroxysmal a-fib   . Renal insufficiency   . COPD (chronic obstructive pulmonary disease)   . Mitral valve insufficiency     Patient Active Problem List   Diagnosis Date Noted  . 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 Frank hematuria 11/14/2014  . Physical deconditioning 06/09/2014  . Asthma with acute exacerbation 06/09/2014  . Acute on chronic diastolic congestive heart failure 06/09/2014  . Heartburn 06/09/2014  . Stage 2 skin ulcer of sacral region 06/09/2014  . Ischemic toe ulcer 06/09/2014  . Paroxysmal atrial fibrillation 06/09/2014  . Essential hypertension  06/09/2014  . Gastrointestinal stromal tumor (GIST) 06/09/2014  . Chronic diarrhea 06/09/2014    Past Surgical History  Procedure Laterality Date  . Back surgery    . Joint replacement      Current Outpatient Rx  Name  Route  Sig  Dispense  Refill  . albuterol (PROVENTIL HFA;VENTOLIN HFA) 108 (90 BASE) MCG/ACT inhaler   Inhalation   Inhale 2 puffs into the lungs every 4 (four) hours as needed for wheezing or shortness of breath.         Marland Kitchen aspirin 325 MG tablet   Oral   Take 325 mg by mouth daily.         . brimonidine (ALPHAGAN) 0.15 % ophthalmic solution   Both Eyes   Place 1 drop into both eyes 2 (two) times daily.         . brinzolamide (AZOPT) 1 % ophthalmic suspension   Both Eyes   Place 1 drop into both eyes 2 (two) times daily.         . budesonide-formoterol (SYMBICORT) 160-4.5 MCG/ACT inhaler   Inhalation   Inhale into the lungs.         . diazepam (VALIUM) 5 MG tablet      Take one tablet by mouth every 12 hours as needed for anxiety/nervousness.   60 tablet   5   . digoxin (LANOXIN) 0.125 MG tablet   Oral   Take 0.125 mg by mouth every other day.         Marland Kitchen  diphenoxylate-atropine (LOMOTIL) 2.5-0.025 MG per tablet      Take two tablets by mouth four times daily as needed for diarrhea   60 tablet   0   . ergocalciferol (VITAMIN D2) 50000 UNITS capsule   Oral   Take 50,000 Units by mouth once a week.          . Fluticasone-Salmeterol (ADVAIR) 500-50 MCG/DOSE AEPB   Inhalation   Inhale 1 puff into the lungs 2 (two) times daily.         Marland Kitchen imatinib (GLEEVEC) 400 MG tablet   Oral   Take 400 mg by mouth daily. Take with meals and large glass of water.Caution:Chemotherapy.         . iron polysaccharides (NIFEREX) 150 MG capsule   Oral   Take by mouth.         . latanoprost (XALATAN) 0.005 % ophthalmic solution   Both Eyes   Place 1 drop into both eyes at bedtime.         . megestrol (MEGACE) 40 MG tablet   Oral   Take 80 mg  by mouth 2 (two) times daily.          . metolazone (ZAROXOLYN) 5 MG tablet   Oral   Take 5 mg by mouth 2 (two) times a week. 2 times weekly on Tuesday and Fri         . Misc Natural Products (GLUCOS-CHONDROIT-MSM COMPLEX PO)   Oral   Take 2 tablets by mouth 3 (three) times daily.         . montelukast (SINGULAIR) 10 MG tablet   Oral   Take 10 mg by mouth at bedtime.         . polycarbophil (FIBERCON) 625 MG tablet   Oral   Take 625 mg by mouth daily.         . potassium chloride SA (K-DUR,KLOR-CON) 20 MEQ tablet   Oral   Take 40 mEq by mouth daily.         . predniSONE (DELTASONE) 10 MG tablet   Oral   Take 10 mg by mouth daily.          Marland Kitchen PREVIDENT 5000 DRY MOUTH 1.1 % GEL dental gel   dental   Place 1 application onto teeth at bedtime.       12     Dispense as written.   . theophylline (THEODUR) 200 MG 12 hr tablet   Oral   Take 200 mg by mouth 2 (two) times daily.         Marland Kitchen torsemide (DEMADEX) 20 MG tablet   Oral   Take 40 mg by mouth. Every Tuesday and Friday         . traMADol (ULTRAM) 50 MG tablet      Take two tablets by mouth three times daily as needed for pain Patient taking differently: Take 100 mg by mouth at bedtime as needed. Take two tablets by mouth three times daily as needed for pain   180 tablet   5     Allergies Ibuprofen; Motrin pm; and Tylenol  No family history on file.  Social History Social History  Substance Use Topics  . Smoking status: Never Smoker   . Smokeless tobacco: Never Used  . Alcohol Use: No    Review of Systems  Constitutional: Negative for fever. Eyes: Negative for visual changes. ENT: Negative for sore throat. Cardiovascular: Negative for chest pain. Respiratory: Positive for shortness of breath. No  cough Gastrointestinal: Negative for abdominal pain, vomiting and diarrhea. Genitourinary: Negative for dysuria. Musculoskeletal: Negative for back pain. Skin: Negative for  rash. Neurological: Negative for headaches, focal weakness or numbness. 10 point Review of Systems otherwise negative ____________________________________________   PHYSICAL EXAM:  VITAL SIGNS: ED Triage Vitals  Enc Vitals Group     BP 12/21/14 1446 132/80 mmHg     Pulse Rate 12/21/14 1446 103     Resp 12/21/14 1446 22     Temp 12/21/14 1446 98.2 F (36.8 C)     Temp Source 12/21/14 1446 Oral     SpO2 12/21/14 1446 100 %     Weight 12/21/14 1446 132 lb (59.875 kg)     Height 12/21/14 1446 5\' 5"  (1.651 m)     Head Cir --      Peak Flow --      Pain Score 12/21/14 1447 0     Pain Loc --      Pain Edu? --      Excl. in [place]? --      Constitutional: Alert and oriented. Well appearing. Mild respiratory distress with active wheezing.. Eyes: Conjunctivae are normal. PERRL. Normal extraocular movements. ENT   Head: Normocephalic and atraumatic.   Nose: No congestion/rhinnorhea.   Mouth/Throat: Mucous membranes are moist.   Neck: No stridor. Cardiovascular/Chest: Regular rate, irregularly irregular rhythm.  No murmurs, rubs, or gallops. Respiratory: No retractions. Increased respiratory rate. Audible wheezing without stethoscope in all fields. No rhonchi. Gastrointestinal: Soft. No distention, no guarding, no rebound. Nontender   Genitourinary/rectal:Deferred Musculoskeletal: Nontender with normal range of motion in all extremities. No joint effusions.  4+ pitting edema bilateral lower extremities from the foot all the way up into the groin. Neurologic:  Normal speech and language. No gross or focal neurologic deficits are appreciated. Skin:  Skin is warm, dry and intact. No rash noted. Psychiatric: Mood and affect are normal. Speech and behavior are normal. Patient exhibits appropriate insight and judgment.  ____________________________________________   EKG I, Lisa Roca, MD, the attending physician have personally viewed and interpreted all ECGs.  9 bpm. Sinus  rhythm. Left axis deviation. Nonspecific intraventricular conduction delay. Frequent PVCs. Nonspecific T-wave. ____________________________________________  LABS (pertinent positives/negatives)  White blood count 7.1, hemoglobin 9.9, platelets 419 Metabolic panel significant for potassium 3.2, chloride 99, BUN 30 and creatinine 1.92 with a GFR of 27 Troponin 0.28 Digoxin level 0.9  ____________________________________________  RADIOLOGY All Xrays were viewed by me. Imaging interpreted by Radiologist.  Chest x-ray:  IMPRESSION: Stable severe cardiomegaly. No acute cardiopulmonary abnormality.   Ultrasound lower extremity for DVT: IMPRESSION: 1. No evidence of deep venous thrombosis. 2. Mild diffuse soft tissue edema along the right popliteal region and calf, and along the left calf.  __________________________________________  PROCEDURES  Procedure(s) performed: None Critical Care performed: None  ____________________________________________   ED COURSE / ASSESSMENT AND PLAN  CONSULTATIONS: Hospitalist for admission  Pertinent labs & imaging results that were available during my care of the patient were reviewed by me and considered in my medical decision making (see chart for details).  Overall the patient does not look like she'll be able to go home today, I suspect she'll likely need hospitalization. Clinically the patient's wheezing seems like some component of asthma along with volume overload. She is being started on Xopenex. Of note her pulmonologist yesterday felt like her shortness of breath was more due to CHF rather than bronchospasm.   Her troponin came back at 0.28, which is similar to  prior troponin minimal elevation previously, however she'll likely need to rule out to make sure this is stable and not going up. She was given aspirin 4 baby dose here.. She is having no chest pain, and no significant concerning findings on EKG. The patient's GFR is too low  to safely allow evaluation with CT scan chest with contrast to rule out PE. I'm going to go forward with an ultrasound of the lower extremities.  Ultrasound the lower extremities was negative for DVT. I did proceed with V/Q scanning to rule out PE given the persistence of the complaint of shortness of breath, although her bronchospasm is now gone after the Xopenex treatment. She was on 3 L O2 which she's been on for the past couple days, went to the soft she still reporting some shortness of breath, however O2 sat remained in the mid 90s to upper 90s. I did not attempt to ambulate the patient as she typically walks just with a walker.  In terms of treating the CHF, patient's BUN and creatinine are minimally elevated, I think she is any in-hospital treatment of the fluid overload state and monitoring of her kidney function. She was given a dose of Lasix 40 mg IV here in the emergency room.  Patient / Family / Caregiver informed of clinical course, medical decision-making process, and agree with plan.    ___________________________________________   FINAL CLINICAL IMPRESSION(S) / ED DIAGNOSES   Final diagnoses:  Bilateral lower extremity edema  Shortness of breath  Troponin level elevated  Bronchospasm  Acute exacerbation of CHF (congestive heart failure)     Lisa Roca, MD 12/21/14 1845

## 2014-12-21 NOTE — Progress Notes (Signed)
   12/21/14 2000  Clinical Encounter Type  Visited With Patient and family together  Visit Type Spiritual support  Spiritual Encounters  Spiritual Needs Prayer  Stress Factors  Patient Stress Factors None identified  Family Stress Factors None identified   Status: 36yrs female/CHF (congestive heart failure), NYHA class II Family: 2 daughters bedside Faith: Methodist Visit Assessment: The chaplain visited with the family and the patient. The patient shared that she was very hungry before she ate her sandwich. She said that she was feeling ok now. Silent prayers for healing and comfort were offered from door since the patient was having her meal.  Pastoral Care: [phone] pager or by online request

## 2014-12-21 NOTE — ED Notes (Signed)
C/O shortness of breath, onset of symptoms last Friday.  States seen by pulmonologist yesterday, had blood work done and D dimer was positive.  Patient started on 3L/[place].  Shortness of breath worsening today.  Here for Eval.

## 2014-12-22 ENCOUNTER — Inpatient Hospital Stay: Payer: Medicare Other

## 2014-12-22 ENCOUNTER — Encounter: Payer: Self-pay | Admitting: Radiology

## 2014-12-22 MED ORDER — POTASSIUM CHLORIDE CRYS ER 20 MEQ PO TBCR
20.0000 meq | EXTENDED_RELEASE_TABLET | Freq: Once | ORAL | Status: AC
  Administered 2014-12-22: 20 meq via ORAL
  Filled 2014-12-22: qty 1

## 2014-12-22 MED ORDER — TECHNETIUM TO 99M ALBUMIN AGGREGATED
3.8330 | Freq: Once | INTRAVENOUS | Status: AC | PRN
  Administered 2014-12-22: 3.833 via INTRAVENOUS

## 2014-12-22 MED ORDER — TECHNETIUM TC 99M DIETHYLENETRIAME-PENTAACETIC ACID
43.4580 | Freq: Once | INTRAVENOUS | Status: DC | PRN
  Administered 2014-12-22: 43.458 via RESPIRATORY_TRACT
  Filled 2014-12-22: qty 43.46

## 2014-12-22 NOTE — Progress Notes (Signed)
Patient was made an initial appointment at the outpatient [facility] on January 12, 2015 at 1:00pm. Thank you.

## 2014-12-22 NOTE — Progress Notes (Addendum)
Pt. Discharged to [facility] and will resume her prior PT, discharged via [facility]. Discharge instructions and medication regimen reviewed at bedside with patient and daughter. Both verbalize understanding of instructions and medication regimen. Patient assessment unchanged from this morning. TELE and IV discontinued per policy. Patients home medications were returned to her.

## 2014-12-22 NOTE — Consult Note (Signed)
Reason for Consult: edema congestive heart failure renal insufficiency Referring Physician:  Dr.Sona Patel hospitalized,  Doctors making House calls  [name] is an 81 y.o. female.  HPI:  81-year-old black female multiple medical problems congestive heart failure cardiomyopathy leg edema renal insufficiency the paroxysmal atrial fibrillation chronic diarrhea and gastric cancer. Patient complains of increased fatigue shortness of breath weakness and progressive leg edema. Patient being controlled with torsemide and Zaroxolyn in the past with worsening renal insufficiency. The patient is being followed by Nephrology to help with management. The patient recently had a V/Q scan as an outpatient with Dr. Fleming and which reportedly was negative she also had venous Dopplers of the lower extremities which was negative for DVT. Patient denies any chest pain no no palpitations no blackout spells of syncope no bleeding has done better with IV Lasix. Patient does not like I doses event diuretics because of  Frequency urinating it limits her quality of life. The patient states shortness of breath and leg swelling improved and may be ready to return home with adjusted diuretic regimen  Past Medical History  Diagnosis Date  . Asthma   . Anxiety   . Hypertension   . Hyperlipidemia   . CVA (cerebral infarction)   . Paroxysmal a-fib   . Renal insufficiency   . COPD (chronic obstructive pulmonary disease)   . Mitral valve insufficiency   . Cancer     lung    Past Surgical History  Procedure Laterality Date  . Back surgery    . Joint replacement      History reviewed. No pertinent family history.  Social History:  reports that she has never smoked. She has never used smokeless tobacco. She reports that she does not drink alcohol or use illicit drugs.  Allergies:  Allergies  Allergen Reactions  . Ibuprofen Other (See Comments)    Pt states that she can't take because of her kidneys.    . Motrin  Pm [Ibuprofen-Diphenhydramine Cit] Other (See Comments)    Pt states that she can't take because of her kidneys.    . Tylenol [Acetaminophen] Other (See Comments)    Pt states that she can't take because it is contraindicated with her Gleevec.      Medications: I have reviewed the patient's current medications.  Results for orders placed or performed during the hospital encounter of 12/21/14 (from the past 48 hour(s))  Basic metabolic panel     Status: Abnormal   Collection Time: 12/21/14  3:03 PM  Result Value Ref Range   Sodium 136 135 - 145 mmol/L   Potassium 3.2 (L) 3.5 - 5.1 mmol/L   Chloride 99 (L) 101 - 111 mmol/L   CO2 25 22 - 32 mmol/L   Glucose, Bld 109 (H) 65 - 99 mg/dL   BUN 30 (H) 6 - 20 mg/dL   Creatinine, Ser 1.92 (H) 0.44 - 1.00 mg/dL   Calcium 9.1 8.9 - 10.3 mg/dL   GFR calc non Af Amer 24 (L) >60 mL/min   GFR calc Af Amer 27 (L) >60 mL/min    Comment: (NOTE) The eGFR has been calculated using the CKD EPI equation. This calculation has not been validated in all clinical situations. eGFR's persistently <60 mL/min signify possible Chronic Kidney Disease.    Anion gap 12 5 - 15  CBC     Status: Abnormal   Collection Time: 12/21/14  3:03 PM  Result Value Ref Range   WBC 7.1 3.6 - 11.0 K/uL     RBC 3.40 (L) 3.80 - 5.20 MIL/uL   Hemoglobin 9.9 (L) 12.0 - 16.0 g/dL   HCT 30.5 (L) 35.0 - 47.0 %   MCV 89.6 80.0 - 100.0 fL   MCH 29.1 26.0 - 34.0 pg   MCHC 32.5 32.0 - 36.0 g/dL   RDW 13.4 11.5 - 14.5 %   Platelets 124 (L) 150 - 440 K/uL  Troponin I     Status: Abnormal   Collection Time: 12/21/14  3:03 PM  Result Value Ref Range   Troponin I 0.28 (H) <0.031 ng/mL    Comment: READ BACK AND VERIFIED WITH JANE RYAN ON 12/21/14 AT 1545 BY TB.        PERSISTENTLY INCREASED TROPONIN VALUES IN THE RANGE OF 0.04-0.49 ng/mL CAN BE SEEN IN:       -UNSTABLE ANGINA       -CONGESTIVE HEART FAILURE       -MYOCARDITIS       -CHEST TRAUMA       -ARRYHTHMIAS       -LATE  PRESENTING MYOCARDIAL INFARCTION       -COPD   CLINICAL FOLLOW-UP RECOMMENDED.   Digoxin level     Status: None   Collection Time: 12/21/14  4:25 PM  Result Value Ref Range   Digoxin Level 0.9 0.8 - 2.0 ng/mL    Dg Chest 2 View  12/21/2014   CLINICAL DATA:  81-YEAR-OLD female with shortness of breath and wheezing since last week. Subsequent encounter.  EXAM: CHEST  2 VIEW  COMPARISON:  07/29/2013.  05/31/2014.  FINDINGS: Seated AP and lateral views of the chest. chronic severe cardiomegaly. calcified atherosclerosis of the aorta. stable lung volumes with eventration of the diaphragm. no pneumothorax or pulmonary edema. no pleural effusion or acute pulmonary opacity. multilevel mild compression fractures in the thoracic spine appears stable. No acute osseous abnormality identified. advanced degenerative changes at both shoulders.  IMPRESSION: Stable severe cardiomegaly.  No acute cardiopulmonary abnormality.   Electronically Signed   By: H  Hall M.D.   On: 12/21/2014 15:28   Nm Pulmonary Perf And Vent  12/22/2014   CLINICAL DATA:  Shortness of breath. History of COPD and lung cancer.  EXAM: NUCLEAR MEDICINE VENTILATION - PERFUSION LUNG SCAN  TECHNIQUE: Ventilation images were obtained in multiple projections using inhaled aerosol Tc-99m DTPA. Perfusion images were obtained in multiple projections after intravenous injection of Tc-99m MAA.  RADIOPHARMACEUTICALS:  43.5 Technetium-99m DTPA aerosol inhalation and 3.8 Technetium-99m MAA IV  COMPARISON:  PA and lateral chest 12/21/2014. PET CT scan 02/17/2014.  FINDINGS: Ventilation: Subsegmental defect is seen in the anterior right lower lobe. There is also a large subsegmental defect in the superior segment of the left lower lobe.  Perfusion: A matched perfusion defect is seen in the anterior right lower lobe and in the superior segment of the left lower lobe.  IMPRESSION: Low probability for pulmonary embolus.   Electronically Signed   By: Thomas  Dalessio  M.D.   On: 12/22/2014 09:07   Us Venous Img Lower Bilateral  12/21/2014   CLINICAL DATA:  Intermittent chronic bilateral lower extremity swelling. Initial encounter.  EXAM: BILATERAL LOWER EXTREMITY VENOUS DOPPLER ULTRASOUND  TECHNIQUE: Gray-scale sonography with graded compression, as well as color Doppler and duplex ultrasound were performed to evaluate the lower extremity deep venous systems from the level of the common femoral vein and including the common femoral, femoral, profunda femoral, popliteal and calf veins including the posterior tibial, peroneal and gastrocnemius veins when visible. The superficial   great saphenous vein was also interrogated. Spectral Doppler was utilized to evaluate flow at rest and with distal augmentation maneuvers in the common femoral, femoral and popliteal veins.  COMPARISON:  None.  FINDINGS: RIGHT LOWER EXTREMITY  Common Femoral Vein: No evidence of thrombus. Normal compressibility, respiratory phasicity and response to augmentation.  Saphenofemoral Junction: No evidence of thrombus. Normal compressibility and flow on color Doppler imaging.  Profunda Femoral Vein: No evidence of thrombus. Normal compressibility and flow on color Doppler imaging.  Femoral Vein: No evidence of thrombus. Normal compressibility, respiratory phasicity and response to augmentation.  Popliteal Vein: No evidence of thrombus. Normal compressibility, respiratory phasicity and response to augmentation.  Calf Veins: No evidence of thrombus. Normal compressibility and flow on color Doppler imaging.  Superficial Great Saphenous Vein: No evidence of thrombus. Normal compressibility and flow on color Doppler imaging.  Venous Reflux:  None.  Other Findings: Mild diffuse soft tissue edema is noted along the right popliteal region and calf.  LEFT LOWER EXTREMITY  Common Femoral Vein: No evidence of thrombus. Normal compressibility, respiratory phasicity and response to augmentation.  Saphenofemoral Junction:  No evidence of thrombus. Normal compressibility and flow on color Doppler imaging.  Profunda Femoral Vein: No evidence of thrombus. Normal compressibility and flow on color Doppler imaging.  Femoral Vein: No evidence of thrombus. Normal compressibility, respiratory phasicity and response to augmentation.  Popliteal Vein: No evidence of thrombus. Normal compressibility, respiratory phasicity and response to augmentation.  Calf Veins: No evidence of thrombus. Normal compressibility and flow on color Doppler imaging.  Superficial Great Saphenous Vein: No evidence of thrombus. Normal compressibility and flow on color Doppler imaging.  Venous Reflux:  None.  Other Findings: Mild diffuse soft tissue edema is noted along the left calf.  IMPRESSION: 1. No evidence of deep venous thrombosis. 2. Mild diffuse soft tissue edema along the right popliteal region and calf, and along the left calf.   Electronically Signed   By: Jeffery  Chang M.D.   On: 12/21/2014 17:56    Review of Systems  Constitutional: Positive for malaise/fatigue.  HENT: Positive for congestion.   Eyes: Negative.   Respiratory: Positive for shortness of breath.   Cardiovascular: Positive for orthopnea, leg swelling and PND.  Gastrointestinal: Negative.   Genitourinary: Negative.   Musculoskeletal: Positive for myalgias.  Skin: Negative.   Neurological: Positive for weakness.  Endo/Heme/Allergies: Negative.   Psychiatric/Behavioral: The patient is nervous/anxious.    Blood pressure 119/72, pulse 100, temperature 98.5 F (36.9 C), temperature source Oral, resp. rate 16, height 5' 5" (1.651 m), weight 59.875 kg (132 lb), SpO2 96 %. Physical Exam  Assessment/Plan:  congestive heart failure  leg edema  chronic renal sufficiency states 4  paroxysmal atrial fibrillation  gastric stromal cancer  COPD asthma  GERD  hypertension  hematuria  cardiomyopathy . PLAN  agree with intravenous diuretic therapy acutely  transitioned to  diuretic therapy as an outpatient  follow-up with Nephrology for renal dysfunction  continue blood pressure control  consider support stockings  poor anticoagulation candidate because of hematuria and gastric stromal cancer  continue digoxin for atrial fibrillation rate control  continue inhalers for COPD and asthma  outpatient follow-up with Cardiology as an outpatient  CALLWOOD,DWAYNE D. 12/22/2014, 12:25 PM      

## 2014-12-22 NOTE — Progress Notes (Signed)
SATURATION QUALIFICATIONS: (This note is used to comply with regulatory documentation for home oxygen)  Patient Saturations on Room Air at Rest = 99%  Patient Saturations on Room Air while Ambulating = 98%  Patient Saturations on 0 Liters of oxygen while Ambulating = 96%  Please briefly explain why patient needs home oxygen: patient does not have need for continuous o2

## 2014-12-22 NOTE — Progress Notes (Signed)
[place] [facility] [place] NAME: [name]    MR#:  [id number]  DATE OF BIRTH:  11-08-1933  SUBJECTIVE:  I feel a whole lot better today. I urinated all nite.  REVIEW OF SYSTEMS:   Review of Systems  Constitutional: Negative for fever, chills and weight loss.  HENT: Negative for ear discharge, ear pain and nosebleeds.   Eyes: Negative for blurred vision, pain and discharge.  Respiratory: Negative for sputum production, shortness of breath, wheezing and stridor.   Cardiovascular: Negative for chest pain, palpitations, orthopnea, leg swelling and PND.  Gastrointestinal: Negative for nausea, vomiting, abdominal pain and diarrhea.  Genitourinary: Negative for urgency and frequency.  Musculoskeletal: Negative for back pain and joint pain.  Neurological: Positive for weakness. Negative for sensory change, speech change and focal weakness.  Psychiatric/Behavioral: Negative for depression and hallucinations. The patient is not nervous/anxious.   All other systems reviewed and are negative.  Tolerating Diet:yes Tolerating PT: eval pending  DRUG ALLERGIES:   Allergies  Allergen Reactions  . Ibuprofen Other (See Comments)    Pt states that she can't take because of her kidneys.    . Motrin Pm [Ibuprofen-Diphenhydramine Cit] Other (See Comments)    Pt states that she can't take because of her kidneys.    . Tylenol [Acetaminophen] Other (See Comments)    Pt states that she can't take because it is contraindicated with her Gleevec.      VITALS:  Blood pressure 112/68, pulse 94, temperature 98.5 F (36.9 C), temperature source Oral, resp. rate 23, height 5\' 5"  (1.651 m), weight 59.875 kg (132 lb), SpO2 97 %.  PHYSICAL EXAMINATION:   Physical Exam  GENERAL:  81 y.o.-year-old patient lying in the bed with no acute distress.  EYES: Pupils equal, round, reactive to light and accommodation. No scleral icterus. Extraocular muscles intact.  Pallor+ HEENT: Head atraumatic, normocephalic. Oropharynx and nasopharynx clear.  NECK:  Supple, no jugular venous distention. No thyroid enlargement, no tenderness.  LUNGS: Normal breath sounds bilaterally, no wheezing, rales, rhonchi. No use of accessory muscles of respiration.  CARDIOVASCULAR: S1, S2 normal. Systolic murmurs +, rubs, or gallops.  ABDOMEN: Soft, nontender, nondistended. Bowel sounds present. No organomegaly or mass.  EXTREMITIES: No cyanosis, clubbing, + edema b/l.    NEUROLOGIC: Cranial nerves II through XII are intact. No focal Motor or sensory deficits b/l.   PSYCHIATRIC: The patient is alert and oriented x 3.  SKIN: No obvious rash, lesion, or ulcer.    LABORATORY PANEL:   CBC  Recent Labs Lab 12/21/14 1503  WBC 7.1  HGB 9.9*  HCT 30.5*  PLT 124*    Chemistries   Recent Labs Lab 12/15/14 1121 12/21/14 1503  NA 141 136  K 3.8 3.2*  CL 105 99*  CO2 29 25  GLUCOSE 93 109*  BUN 27* 30*  CREATININE 1.26* 1.92*  CALCIUM 8.5* 9.1  AST 45*  --   ALT 27  --   ALKPHOS 35*  --   BILITOT 0.7  --     Cardiac Enzymes  Recent Labs Lab 12/21/14 1503  TROPONINI 0.28*    RADIOLOGY:  Dg Chest 2 View  12/21/2014   CLINICAL DATA:  81-YEAR-OLD female with shortness of breath and wheezing since last week. Subsequent encounter.  EXAM: CHEST  2 VIEW  COMPARISON:  07/29/2013.  05/31/2014.  FINDINGS: Seated AP and lateral views of the chest. chronic severe cardiomegaly. calcified atherosclerosis of the aorta. stable lung  volumes with eventration of the diaphragm. no pneumothorax or pulmonary edema. no pleural effusion or acute pulmonary opacity. multilevel mild compression fractures in the thoracic spine appears stable. No acute osseous abnormality identified. advanced degenerative changes at both shoulders.  IMPRESSION: Stable severe cardiomegaly.  No acute cardiopulmonary abnormality.   Electronically Signed   By: Genevie Ann M.D.   On: 12/21/2014 15:28   Nm  Pulmonary Perf And Vent  12/22/2014   CLINICAL DATA:  Shortness of breath. History of COPD and lung cancer.  EXAM: NUCLEAR MEDICINE VENTILATION - PERFUSION LUNG SCAN  TECHNIQUE: Ventilation images were obtained in multiple projections using inhaled aerosol Tc-43m DTPA. Perfusion images were obtained in multiple projections after intravenous injection of Tc-99m MAA.  RADIOPHARMACEUTICALS:  43.5 Technetium-28m DTPA aerosol inhalation and 3.8 Technetium-23m MAA IV  COMPARISON:  PA and lateral chest 12/21/2014. PET CT scan 02/17/2014.  FINDINGS: Ventilation: Subsegmental defect is seen in the anterior right lower lobe. There is also a large subsegmental defect in the superior segment of the left lower lobe.  Perfusion: A matched perfusion defect is seen in the anterior right lower lobe and in the superior segment of the left lower lobe.  IMPRESSION: Low probability for pulmonary embolus.   Electronically Signed   By: Inge Rise M.D.   On: 12/22/2014 09:07   US Venous Img Lower Bilateral  12/21/2014   CLINICAL DATA:  Intermittent chronic bilateral lower extremity swelling. Initial encounter.  EXAM: BILATERAL LOWER EXTREMITY VENOUS DOPPLER ULTRASOUND  TECHNIQUE: Gray-scale sonography with graded compression, as well as color Doppler and duplex ultrasound were performed to evaluate the lower extremity deep venous systems from the level of the common femoral vein and including the common femoral, femoral, profunda femoral, popliteal and calf veins including the posterior tibial, peroneal and gastrocnemius veins when visible. The superficial great saphenous vein was also interrogated. Spectral Doppler was utilized to evaluate flow at rest and with distal augmentation maneuvers in the common femoral, femoral and popliteal veins.  COMPARISON:  None.  FINDINGS: RIGHT LOWER EXTREMITY  Common Femoral Vein: No evidence of thrombus. Normal compressibility, respiratory phasicity and response to augmentation.   Saphenofemoral Junction: No evidence of thrombus. Normal compressibility and flow on color Doppler imaging.  Profunda Femoral Vein: No evidence of thrombus. Normal compressibility and flow on color Doppler imaging.  Femoral Vein: No evidence of thrombus. Normal compressibility, respiratory phasicity and response to augmentation.  Popliteal Vein: No evidence of thrombus. Normal compressibility, respiratory phasicity and response to augmentation.  Calf Veins: No evidence of thrombus. Normal compressibility and flow on color Doppler imaging.  Superficial Great Saphenous Vein: No evidence of thrombus. Normal compressibility and flow on color Doppler imaging.  Venous Reflux:  None.  Other Findings: Mild diffuse soft tissue edema is noted along the right popliteal region and calf.  LEFT LOWER EXTREMITY  Common Femoral Vein: No evidence of thrombus. Normal compressibility, respiratory phasicity and response to augmentation.  Saphenofemoral Junction: No evidence of thrombus. Normal compressibility and flow on color Doppler imaging.  Profunda Femoral Vein: No evidence of thrombus. Normal compressibility and flow on color Doppler imaging.  Femoral Vein: No evidence of thrombus. Normal compressibility, respiratory phasicity and response to augmentation.  Popliteal Vein: No evidence of thrombus. Normal compressibility, respiratory phasicity and response to augmentation.  Calf Veins: No evidence of thrombus. Normal compressibility and flow on color Doppler imaging.  Superficial Great Saphenous Vein: No evidence of thrombus. Normal compressibility and flow on color Doppler imaging.  Venous Reflux:  None.  Other Findings: Mild diffuse soft tissue edema is noted along the left calf.  IMPRESSION: 1. No evidence of deep venous thrombosis. 2. Mild diffuse soft tissue edema along the right popliteal region and calf, and along the left calf.   Electronically Signed   By: Garald Balding M.D.   On: 12/21/2014 17:56     ASSESSMENT AND  PLAN:   81 year old 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 She was feeling extremely short of breath at home came to the emergency room and was found to have  1 acute on chronic systolic congestive heart failure. Patient's echo per Dr. Clayborn Bigness this year showed EF of 40% with severe MR and large right atrium. I will not repeat echo. IV Lasix 20 mg twice a day monitor I's and O's and monitor creatinine -diuresed well. Leg edema improved a lot. Incontinent of urine Feels back to baseline  2. Elevated d-dimer per Dr. Raul Del Bilateral lower extremity Doppler is negative. V/Q scan negative for PE  3. History of paroxysmal A. Fib Continue digoxin, aspirin.  4 acute on chronic CK D stage III Monitor I's and O's over nephrotoxin consider nephrology consultation if creatinine continues to rise.  -pt has appt with dr Juleen China in [place]. She will follow with him  5 history of glaucoma continue eyedrops  6 COPD continue Singulair and nebulizer along with inhalers. Appears stable  CODE STATUS full code  Overall improved. D/c home later today with resumption of outpt PT  Case discussed with Care Management/Social Worker. Management plans discussed with the patient, family and they are in agreement.  CODE STATUS: full  DVT Prophylaxis: lovenox  TOTAL TIME TAKING CARE OF THIS PATIENT: 40 minutes.  >50% time spent on counselling and coordination of care  D/w dr Benedetto Goad M.D on 12/22/2014 at 9:15 AM  Between 7am to 6pm - Pager - [phone]  After 6pm go to [email or web address] - password [email or web address] [facility]  [facility]  Office  [phone]  CC: Primary care physician; No PCP Per Patient

## 2014-12-22 NOTE — Progress Notes (Signed)
Confirmed with patient that her daughter will be able to pick her up for discharge around 1900 this evening.

## 2014-12-22 NOTE — Evaluation (Signed)
Physical Therapy Evaluation Patient Details Name: [name] MRN: [id number] DOB: 1933-09-18 Today's Date: 12/22/2014   History of Present Illness  Pt is a 81 y.o. female presenting to hospital with SOB and leg swelling. Dopplers B LE's (-) for DVT and VQ scan negative for PE.  Pt admitted with acute on chronic systolic CHF.  Clinical Impression  Currently pt demonstrates impairments with strength and limitations with functional mobility.  Prior to admission, pt required assist daily with getting in/out of bed, standing from bed, and for ADL's; pt then was modified independent with functional mobility using rollator and L AFO and used a lift chair to stand during the day.  Pt lives at independent living facility and ambulates to dining room with rollator for meals (pt reports not too far from room).  Currently pt appears close to baseline in terms of functional mobility compared to prior to admission (per pt report) and pt was receiving HHPT and HHOT.  Pt would benefit from skilled PT to address above noted impairments and functional limitations and prevent decline during hospital stay.  Recommend pt discharge to home with prior level of assist and prior services (including HHPT) when medically appropriate.     Follow Up Recommendations Home health PT (pt already has HHPT set-up and will continue upon discharge)    Equipment Recommendations  None recommended by PT    Recommendations for Other Services       Precautions / Restrictions Precautions Precautions: Fall Required Braces or Orthoses: Other Brace/Splint Other Brace/Splint: L AFO Restrictions Weight Bearing Restrictions: No      Mobility  Bed Mobility Overal bed mobility: Needs Assistance Bed Mobility: Supine to Sit     Supine to sit: Mod assist;Max assist;HOB elevated     General bed mobility comments: assist for LE's and trunk (pt reports needing this assist baseline)  Transfers Overall transfer level: Needs  assistance Equipment used: Rolling walker (2 wheeled) Transfers: Sit to/from Stand Sit to Stand: Min assist (from bed and lower surfaces)         General transfer comment: increased height of chair slightly (to simulate home height surfaces per pt description) and pt able to stand with SBA up to RW without loss of balance or any concern of falling  Ambulation/Gait Ambulation/Gait assistance: Min guard Ambulation Distance (Feet): 90 Feet Assistive device: Rolling walker (2 wheeled) Gait Pattern/deviations: Step-to pattern Gait velocity: decreased   General Gait Details: decreased L foot clearance even with L AFO (pt with intermittent difficulty advancing L LE but pt steady without loss of balance); pt sometimes able to clear floor and other times limited d/t not being able to clear floor with L foot; decreased cadence but steady; 2 short standing rest breaks with RW  Stairs            Wheelchair Mobility    Modified Rankin (Stroke Patients Only)       Balance Overall balance assessment: No apparent balance deficits (not formally assessed) (No loss of balance with functional mobility during session)                                           Pertinent Vitals/Pain Pain Assessment: No/denies pain  During session pt's HR upper 90's to lower 100's and O2 >95% with functional mobility on room air.    Home Living Family/patient expects to be discharged to:: Assisted living (Independent  Living at [facility]; is close to dining room)               Home Equipment: Gilford Rile - 4 wheels;Shower seat;Bedside commode;Wheelchair - manual      Prior Function Level of Independence: Independent with assistive device(s);Needs assistance   Gait / Transfers Assistance Needed: needs assist to stand from bed but otherwise uses lift chair to help with standing as well as rollator (sits on rollator in dining room d/t easier to stand from); has assist 7 days a week to get  ready and into bed and assist every morning to get out of bed.  ADL's / Homemaking Assistance Needed: Has assist 7 days per week (2 hours in morning M-Saturday and 1 hour on Sunday for dressing, breakfast, bathing, etc and assist 7 days/week at night to get ready for bed).  Comments: Uses rollator and has home O2 at night (2-3 L/min via nasal cannula); pt's daughter comes to assist every Sunday; uses L AFO d/t foot drop baseline     Hand Dominance        Extremity/Trunk Assessment   Upper Extremity Assessment: Overall WFL for tasks assessed           Lower Extremity Assessment: RLE deficits/detail;LLE deficits/detail RLE Deficits / Details: WFL LLE Deficits / Details: L hip flexion 2+/5; L knee extension 4/5; L DF 0/5; L inversion 2+/5     Communication   Communication: No difficulties  Cognition Arousal/Alertness: Awake/alert Behavior During Therapy: WFL for tasks assessed/performed Overall Cognitive Status: Within Functional Limits for tasks assessed                      General Comments   Nursing cleared pt for participation in physical therapy.  Pt agreeable to PT session. Discussed pt's elevated troponin with Dr. Posey Pronto who stated that it was demand ischemia and PT was cleared to work with pt.    Exercises   Performed semi-supine B LE therapeutic exercise x 10 reps:  Ankle pumps (AROM R LE); quad sets x3 second holds (AROM B LE's); glute squeezes x3 second holds (AROM B); SAQ's (AROM R; AROM L); heelslides (AAROM R; AAROM L), hip abd/adduction (AROM R; AAROM L).  Pt required vc's and tactile cues for correct technique with exercises.       Assessment/Plan    PT Assessment Patient needs continued PT services  PT Diagnosis Difficulty walking   PT Problem List Decreased strength;Decreased mobility;Decreased activity tolerance  PT Treatment Interventions DME instruction;Gait training;Functional mobility training;Therapeutic activities;Therapeutic  exercise;Balance training;Patient/family education   PT Goals (Current goals can be found in the Care Plan section) Acute Rehab PT Goals Patient Stated Goal: To go home PT Goal Formulation: With patient Time For Goal Achievement: 01/05/15 Potential to Achieve Goals: Good    Frequency Min 2X/week   Barriers to discharge        Co-evaluation               End of Session Equipment Utilized During Treatment: Gait belt;Other (comment) (L AFO) Activity Tolerance: Patient tolerated treatment well Patient left: in chair;with call bell/phone within reach;with chair alarm set Nurse Communication: Mobility status (O2 sats during mobility)         Time: 1105-1200 PT Time Calculation (min) (ACUTE ONLY): 55 min   Charges:   PT Evaluation $Initial PT Evaluation Tier I: 1 Procedure PT Treatments $Therapeutic Exercise: 8-22 mins $Therapeutic Activity: 8-22 mins   PT G Codes:  Leitha Bleak 12/22/2014, 1:51 PM Leitha Bleak, [place]

## 2014-12-22 NOTE — Progress Notes (Signed)
PT Cancellation Note  Patient Details Name: [name] MRN: [id number] DOB: Jan 29, 1934   Cancelled Treatment:    Reason Eval/Treat Not Completed: Medical issues which prohibited therapy.  Pt's troponin elevated (current lab value available from 12/21/14) and pt pending VQ scan.  Will hold PT at this time and re-attempt at a later date/time as medically appropriate.   Raquel Sarna Justn Quale 12/22/2014, 8:41 AM Leitha Bleak, [place]

## 2014-12-22 NOTE — Discharge Instructions (Signed)
Heart Failure Clinic appointment on January 12, 2015 at 1:00pm with Darylene Price, [place]. Please call [phone] to reschedule.      Heart Failure Heart failure is a condition in which the heart has trouble pumping blood. This means your heart does not pump blood efficiently for your body to work well. In some cases of heart failure, fluid may back up into your lungs or you may have swelling (edema) in your lower legs. Heart failure is usually a long-term (chronic) condition. It is important for you to take good care of yourself and follow your health care provider's treatment plan. CAUSES  Some health conditions can cause heart failure. Those health conditions include:  High blood pressure (hypertension). Hypertension causes the heart muscle to work harder than normal. When pressure in the blood vessels is high, the heart needs to pump (contract) with more force in order to circulate blood throughout the body. High blood pressure eventually causes the heart to become stiff and weak.  Coronary artery disease (CAD). CAD is the buildup of cholesterol and fat (plaque) in the arteries of the heart. The blockage in the arteries deprives the heart muscle of oxygen and blood. This can cause chest pain and may lead to a heart attack. High blood pressure can also contribute to CAD.  Heart attack (myocardial infarction). A heart attack occurs when one or more arteries in the heart become blocked. The loss of oxygen damages the muscle tissue of the heart. When this happens, part of the heart muscle dies. The injured tissue does not contract as well and weakens the heart's ability to pump blood.  Abnormal heart valves. When the heart valves do not open and close properly, it can cause heart failure. This makes the heart muscle pump harder to keep the blood flowing.  Heart muscle disease (cardiomyopathy or myocarditis). Heart muscle disease is damage to the heart muscle from a variety of causes. These can  include drug or alcohol abuse, infections, or unknown reasons. These can increase the risk of heart failure.  Lung disease. Lung disease makes the heart work harder because the lungs do not work properly. This can cause a strain on the heart, leading it to fail.  Diabetes. Diabetes increases the risk of heart failure. High blood sugar contributes to high fat (lipid) levels in the blood. Diabetes can also cause slow damage to tiny blood vessels that carry important nutrients to the heart muscle. When the heart does not get enough oxygen and food, it can cause the heart to become weak and stiff. This leads to a heart that does not contract efficiently.  Other conditions can contribute to heart failure. These include abnormal heart rhythms, thyroid problems, and low blood counts (anemia). Certain unhealthy behaviors can increase the risk of heart failure, including:  Being overweight.  Smoking or chewing tobacco.  Eating foods high in fat and cholesterol.  Abusing illicit drugs or alcohol.  Lacking physical activity. SYMPTOMS  Heart failure symptoms may vary and can be hard to detect. Symptoms may include:  Shortness of breath with activity, such as climbing stairs.  Persistent cough.  Swelling of the feet, ankles, legs, or abdomen.  Unexplained weight gain.  Difficulty breathing when lying flat (orthopnea).  Waking from sleep because of the need to sit up and get more air.  Rapid heartbeat.  Fatigue and loss of energy.  Feeling light-headed, dizzy, or close to fainting.  Loss of appetite.  Nausea.  Increased urination during the night (nocturia). DIAGNOSIS  A diagnosis of heart failure is based on your history, symptoms, physical examination, and diagnostic tests. Diagnostic tests for heart failure may include:  Echocardiography.  Electrocardiography.  Chest X-ray.  Blood tests.  Exercise stress test.  Cardiac angiography.  Radionuclide scans. TREATMENT    Treatment is aimed at managing the symptoms of heart failure. Medicines, behavioral changes, or surgical intervention may be necessary to treat heart failure.  Medicines to help treat heart failure may include:  Angiotensin-converting enzyme (ACE) inhibitors. This type of medicine blocks the effects of a blood protein called angiotensin-converting enzyme. ACE inhibitors relax (dilate) the blood vessels and help lower blood pressure.  Angiotensin receptor blockers (ARBs). This type of medicine blocks the actions of a blood protein called angiotensin. Angiotensin receptor blockers dilate the blood vessels and help lower blood pressure.  Water pills (diuretics). Diuretics cause the kidneys to remove salt and water from the blood. The extra fluid is removed through urination. This loss of extra fluid lowers the volume of blood the heart pumps.  Beta blockers. These prevent the heart from beating too fast and improve heart muscle strength.  Digitalis. This increases the force of the heartbeat.  Healthy behavior changes include:  Obtaining and maintaining a healthy weight.  Stopping smoking or chewing tobacco.  Eating heart-healthy foods.  Limiting or avoiding alcohol.  Stopping illicit drug use.  Physical activity as directed by your health care provider.  Surgical treatment for heart failure may include:  A procedure to open blocked arteries, repair damaged heart valves, or remove damaged heart muscle tissue.  A pacemaker to improve heart muscle function and control certain abnormal heart rhythms.  An internal cardioverter defibrillator to treat certain serious abnormal heart rhythms.  A left ventricular assist device (LVAD) to assist the pumping ability of the heart. HOME CARE INSTRUCTIONS   Take medicines only as directed by your health care provider. Medicines are important in reducing the workload of your heart, slowing the progression of heart failure, and improving your  symptoms.  Do not stop taking your medicine unless directed by your health care provider.  Do not skip any dose of medicine.  Refill your prescriptions before you run out of medicine. Your medicines are needed every day.  Engage in moderate physical activity if directed by your health care provider. Moderate physical activity can benefit some people. The elderly and people with severe heart failure should consult with a health care provider for physical activity recommendations.  Eat heart-healthy foods. Food choices should be free of trans fat and low in saturated fat, cholesterol, and salt (sodium). Healthy choices include fresh or frozen fruits and vegetables, fish, lean meats, legumes, fat-free or low-fat dairy products, and whole grain or high fiber foods. Talk to a dietitian to learn more about heart-healthy foods.  Limit sodium if directed by your health care provider. Sodium restriction may reduce symptoms of heart failure in some people. Talk to a dietitian to learn more about heart-healthy seasonings.  Use healthy cooking methods. Healthy cooking methods include roasting, grilling, broiling, baking, poaching, steaming, or stir-frying. Talk to a dietitian to learn more about healthy cooking methods.  Limit fluids if directed by your health care provider. Fluid restriction may reduce symptoms of heart failure in some people.  Weigh yourself every day. Daily weights are important in the early recognition of excess fluid. You should weigh yourself every morning after you urinate and before you eat breakfast. Wear the same amount of clothing each time you weigh yourself. Record  your daily weight. Provide your health care provider with your weight record.  Monitor and record your blood pressure if directed by your health care provider.  Check your pulse if directed by your health care provider.  Lose weight if directed by your health care provider. Weight loss may reduce symptoms of heart  failure in some people.  Stop smoking or chewing tobacco. Nicotine makes your heart work harder by causing your blood vessels to constrict. Do not use nicotine gum or patches before talking to your health care provider.  Keep all follow-up visits as directed by your health care provider. This is important.  Limit alcohol intake to no more than 1 drink per day for nonpregnant women and 2 drinks per day for men. One drink equals 12 ounces of beer, 5 ounces of wine, or 1 ounces of hard liquor. Drinking more than that is harmful to your heart. Tell your health care provider if you drink alcohol several times a week. Talk with your health care provider about whether alcohol is safe for you. If your heart has already been damaged by alcohol or you have severe heart failure, drinking alcohol should be stopped completely.  Stop illicit drug use.  Stay up-to-date with immunizations. It is especially important to prevent respiratory infections through current pneumococcal and influenza immunizations.  Manage other health conditions such as hypertension, diabetes, thyroid disease, or abnormal heart rhythms as directed by your health care provider.  Learn to manage stress.  Plan rest periods when fatigued.  Learn strategies to manage high temperatures. If the weather is extremely hot:  Avoid vigorous physical activity.  Use air conditioning or fans or seek a cooler location.  Avoid caffeine and alcohol.  Wear loose-fitting, lightweight, and light-colored clothing.  Learn strategies to manage cold temperatures. If the weather is extremely cold:  Avoid vigorous physical activity.  Layer clothes.  Wear mittens or gloves, a hat, and a scarf when going outside.  Avoid alcohol.  Obtain ongoing education and support as needed.  Participate in or seek rehabilitation as needed to maintain or improve independence and quality of life. SEEK MEDICAL CARE IF:   Your weight increases by 03 lb/1.4 kg  in 1 day or 05 lb/2.3 kg in a week.  You have increasing shortness of breath that is unusual for you.  You are unable to participate in your usual physical activities.  You tire easily.  You cough more than normal, especially with physical activity.  You have any or more swelling in areas such as your hands, feet, ankles, or abdomen.  You are unable to sleep because it is hard to breathe.  You feel like your heart is beating fast (palpitations).  You become dizzy or light-headed upon standing up. SEEK IMMEDIATE MEDICAL CARE IF:   You have difficulty breathing.  There is a change in mental status such as decreased alertness or difficulty with concentration.  You have a pain or discomfort in your chest.  You have an episode of fainting (syncope). MAKE SURE YOU:   Understand these instructions.  Will watch your condition.  Will get help right away if you are not doing well or get worse. Document Released: 04/22/2005 Document Revised: 09/06/2013 Document Reviewed: 05/22/2012 [facility] Patient Information 2015 [place], [place]. This information is not intended to replace advice given to you by your health care provider. Make sure you discuss any questions you have with your health care provider.  Heart Failure Clinic appointment on January 12, 2015 at 1:00pm with  Darylene Price, FNP. Please call [phone] to reschedule.

## 2014-12-22 NOTE — Care Management Note (Addendum)
Case Management Note  Patient Details  Name: [name] MRN: [id number] Date of Birth: 08-Jan-1934  Subjective/Objective:                 Patient admitted with acute on chronic systolic congestive heart failure. Patient lives at home alone at [place]. Patient states that she uses [facility] on [address] To obtain her medications.  Patients daughter is her support system.  Patient states that she already ahd RN an PT services through [facility] and is satisfied with their care.  Patients states that She has an independent care giver that comes out 6 days a week for 2 hours a day.  She also has affordable care that comes in 7 nights a week to assist the patient to bed.  Patient states that she has home O2 set up through [facility].  Patient states that she has a walker, shower chair, BSC, and wheelchair available, and primarily uses the walker to ambulate.  Patient states that her daughter transports her to and from appointments.    Action/Plan: Resumption of care order placed by MD.  Sharmon Revere for [facility] notified of pending discharge.  Paperwork faxed to [employer].  RNCM signing off  Expected Discharge Date:                  Expected Discharge Plan:  [place] (Patient Back to [facility]. [facility] already established)  In-House Referral:     Discharge planning Services  CM Consult  Post Acute Care Choice:    Choice offered to:     DME Arranged:    DME Agency:     HH Arranged:    HH Agency:  [facility]  Status of Service:  Completed, signed off  Medicare Important Message Given:    Date Medicare IM Given:    Medicare IM give by:    Date Additional Medicare IM Given:    Additional Medicare Important Message give by:     If discussed at [place] of Stay Meetings, dates discussed:    Additional Comments:  Beverly Sessions, RN 12/22/2014, 10:10 AM

## 2014-12-22 NOTE — Discharge Summary (Signed)
[facility] NAME: [name]    MR#:  [id number]  DATE OF BIRTH:  09-18-1933  DATE OF ADMISSION:  12/21/2014 ADMITTING PHYSICIAN: Fritzi Mandes, MD  DATE OF DISCHARGE: 12/22/14  PRIMARY CARE PHYSICIAN: No PCP Per Patient    ADMISSION DIAGNOSIS:  Shortness of breath [R06.02] Bronchospasm [J98.01] Acute exacerbation of CHF (congestive heart failure) [I50.9] Troponin level elevated [R79.89] Bilateral lower extremity edema [R60.0]  DISCHARGE DIAGNOSIS:  Acute on chronic systolic CHF Anasarca CKD-III HTN  SECONDARY DIAGNOSIS:   Past Medical History  Diagnosis Date  . Asthma   . Anxiety   . Hypertension   . Hyperlipidemia   . CVA (cerebral infarction)   . Paroxysmal a-fib   . Renal insufficiency   . COPD (chronic obstructive pulmonary disease)   . Mitral valve insufficiency   . Cancer     lung    HOSPITAL COURSE:   81 year old [name] with past medical history of GIST tumor with metastasis, anxiety, hypertension, CKD severe MR and history of systolic congestive heart failure comes to the emergency room with increasing bilateral lower extremity swelling and puffiness of her face. She was feeling extremely short of breath at home came to the emergency room and was found to have  1 acute on chronic systolic congestive heart failure. Patient's echo per Dr. Clayborn Bigness this year showed EF of 40% with severe MR and large right atrium. I will not repeat echo. Received IV Lasix 20 mg twice a day -diuresed well. Leg edema improved a lot. Incontinent of urine -creat 1.9 Feels back to baseline  2. Elevated d-dimer per Dr. Raul Del Bilateral lower extremity Doppler is negative. V/Q scan negative for PE  3. History of paroxysmal A. Fib Continue digoxin, aspirin.  4 acute on chronic CK D stage III Monitor I's and O's over nephrotoxin consider nephrology consultation if creatinine continues to rise.  -pt has appt with  dr Juleen China in [place]. She will follow with him  5 history of glaucoma continue eyedrops  6 COPD continue Singulair and nebulizer along with inhalers. Appears stable  CODE STATUS full code  Overall improved. D/c home later today with resumption of outpt PT   DISCHARGE CONDITIONS:   fair  CONSULTS OBTAINED:  Treatment Team:  Fritzi Mandes, MD Yolonda Kida, MD  DRUG ALLERGIES:   Allergies  Allergen Reactions  . Ibuprofen Other (See Comments)    Pt states that she can't take because of her kidneys.    . Motrin Pm [Ibuprofen-Diphenhydramine Cit] Other (See Comments)    Pt states that she can't take because of her kidneys.    . Tylenol [Acetaminophen] Other (See Comments)    Pt states that she can't take because it is contraindicated with her Gleevec.      DISCHARGE MEDICATIONS:   Current Discharge Medication List    CONTINUE these medications which have NOT CHANGED   Details  aspirin EC 325 MG tablet Take 325 mg by mouth daily.    brimonidine (ALPHAGAN) 0.15 % ophthalmic solution Place 1 drop into both eyes 2 (two) times daily.    brinzolamide (AZOPT) 1 % ophthalmic suspension Place 1 drop into both eyes 2 (two) times daily.    budesonide-formoterol (SYMBICORT) 160-4.5 MCG/ACT inhaler Inhale 2 puffs into the lungs 2 (two) times daily.    diazepam (VALIUM) 5 MG tablet Take one tablet by mouth every 12 hours as needed for anxiety/nervousness. Qty: 60 tablet, Refills: 5  digoxin (LANOXIN) 0.125 MG tablet Take 0.125 mg by mouth daily.     diphenoxylate-atropine (LOMOTIL) 2.5-0.025 MG per tablet Take two tablets by mouth four times daily as needed for diarrhea Qty: 60 tablet, Refills: 0    imatinib (GLEEVEC) 100 MG tablet Take 400 mg by mouth daily.    iron polysaccharides (NIFEREX) 150 MG capsule Take 300 mg by mouth daily.    latanoprost (XALATAN) 0.005 % ophthalmic solution Place 1 drop into both eyes at bedtime.    levalbuterol (XOPENEX HFA) 45 MCG/ACT inhaler  Inhale 2 puffs into the lungs every 4 (four) hours as needed for wheezing.    levalbuterol (XOPENEX) 1.25 MG/3ML nebulizer solution Take 1.25 mg by nebulization every 6 (six) hours as needed for wheezing.    metolazone (ZAROXOLYN) 5 MG tablet Take 5 mg by mouth 2 (two) times a week. Pt takes on Tuesday and Friday.    montelukast (SINGULAIR) 10 MG tablet Take 10 mg by mouth at bedtime.    predniSONE (DELTASONE) 10 MG tablet Take 10 mg by mouth every other day.    theophylline (THEODUR) 200 MG 12 hr tablet Take 200 mg by mouth 2 (two) times daily with a meal.     torsemide (DEMADEX) 20 MG tablet Take 40 mg by mouth 2 (two) times a week. Pt takes on Tuesday and Friday.    traMADol (ULTRAM) 50 MG tablet Take two tablets by mouth three times daily as needed for pain Qty: 180 tablet, Refills: 5    Vitamin D, Ergocalciferol, (DRISDOL) 50000 UNITS CAPS capsule Take 50,000 Units by mouth every 7 (seven) days.        If you experience worsening of your admission symptoms, develop shortness of breath, life threatening emergency, suicidal or homicidal thoughts you must seek medical attention immediately by calling 911 or calling your MD immediately  if symptoms less severe.  You Must read complete instructions/literature along with all the possible adverse reactions/side effects for all the Medicines you take and that have been prescribed to you. Take any new Medicines after you have completely understood and accept all the possible adverse reactions/side effects.   Please note  You were cared for by a hospitalist during your hospital stay. If you have any questions about your discharge medications or the care you received while you were in the hospital after you are discharged, you can call the unit and asked to speak with the hospitalist on call if the hospitalist that took care of you is not available. Once you are discharged, your primary care physician will handle any further medical issues.  Please note that NO REFILLS for any discharge medications will be authorized once you are discharged, as it is imperative that you return to your primary care physician (or establish a relationship with a primary care physician if you do not have one) for your aftercare needs so that they can reassess your need for medications and monitor your lab values.  DATA REVIEW:   CBC   Recent Labs Lab 12/21/14 1503  WBC 7.1  HGB 9.9*  HCT 30.5*  PLT 124*    Chemistries   Recent Labs Lab 12/15/14 1121 12/21/14 1503  NA 141 136  K 3.8 3.2*  CL 105 99*  CO2 29 25  GLUCOSE 93 109*  BUN 27* 30*  CREATININE 1.26* 1.92*  CALCIUM 8.5* 9.1  AST 45*  --   ALT 27  --   ALKPHOS 35*  --   BILITOT 0.7  --  Microbiology Results   No results found for this or any previous visit (from the past 240 hour(s)).  RADIOLOGY:  Dg Chest 2 View  12/21/2014   CLINICAL DATA:  31-YEAR-OLD female with shortness of breath and wheezing since last week. Subsequent encounter.  EXAM: CHEST  2 VIEW  COMPARISON:  07/29/2013.  05/31/2014.  FINDINGS: Seated AP and lateral views of the chest. chronic severe cardiomegaly. calcified atherosclerosis of the aorta. stable lung volumes with eventration of the diaphragm. no pneumothorax or pulmonary edema. no pleural effusion or acute pulmonary opacity. multilevel mild compression fractures in the thoracic spine appears stable. No acute osseous abnormality identified. advanced degenerative changes at both shoulders.  IMPRESSION: Stable severe cardiomegaly.  No acute cardiopulmonary abnormality.   Electronically Signed   By: Genevie Ann M.D.   On: 12/21/2014 15:28   Nm Pulmonary Perf And Vent  12/22/2014   CLINICAL DATA:  Shortness of breath. History of COPD and lung cancer.  EXAM: NUCLEAR MEDICINE VENTILATION - PERFUSION LUNG SCAN  TECHNIQUE: Ventilation images were obtained in multiple projections using inhaled aerosol Tc-35m DTPA. Perfusion images were obtained in multiple  projections after intravenous injection of Tc-19m MAA.  RADIOPHARMACEUTICALS:  43.5 Technetium-12m DTPA aerosol inhalation and 3.8 Technetium-17m MAA IV  COMPARISON:  PA and lateral chest 12/21/2014. PET CT scan 02/17/2014.  FINDINGS: Ventilation: Subsegmental defect is seen in the anterior right lower lobe. There is also a large subsegmental defect in the superior segment of the left lower lobe.  Perfusion: A matched perfusion defect is seen in the anterior right lower lobe and in the superior segment of the left lower lobe.  IMPRESSION: Low probability for pulmonary embolus.   Electronically Signed   By: Inge Rise M.D.   On: 12/22/2014 09:07   US Venous Img Lower Bilateral  12/21/2014   CLINICAL DATA:  Intermittent chronic bilateral lower extremity swelling. Initial encounter.  EXAM: BILATERAL LOWER EXTREMITY VENOUS DOPPLER ULTRASOUND  TECHNIQUE: Gray-scale sonography with graded compression, as well as color Doppler and duplex ultrasound were performed to evaluate the lower extremity deep venous systems from the level of the common femoral vein and including the common femoral, femoral, profunda femoral, popliteal and calf veins including the posterior tibial, peroneal and gastrocnemius veins when visible. The superficial great saphenous vein was also interrogated. Spectral Doppler was utilized to evaluate flow at rest and with distal augmentation maneuvers in the common femoral, femoral and popliteal veins.  COMPARISON:  None.  FINDINGS: RIGHT LOWER EXTREMITY  Common Femoral Vein: No evidence of thrombus. Normal compressibility, respiratory phasicity and response to augmentation.  Saphenofemoral Junction: No evidence of thrombus. Normal compressibility and flow on color Doppler imaging.  Profunda Femoral Vein: No evidence of thrombus. Normal compressibility and flow on color Doppler imaging.  Femoral Vein: No evidence of thrombus. Normal compressibility, respiratory phasicity and response to  augmentation.  Popliteal Vein: No evidence of thrombus. Normal compressibility, respiratory phasicity and response to augmentation.  Calf Veins: No evidence of thrombus. Normal compressibility and flow on color Doppler imaging.  Superficial Great Saphenous Vein: No evidence of thrombus. Normal compressibility and flow on color Doppler imaging.  Venous Reflux:  None.  Other Findings: Mild diffuse soft tissue edema is noted along the right popliteal region and calf.  LEFT LOWER EXTREMITY  Common Femoral Vein: No evidence of thrombus. Normal compressibility, respiratory phasicity and response to augmentation.  Saphenofemoral Junction: No evidence of thrombus. Normal compressibility and flow on color Doppler imaging.  Profunda Femoral Vein: No evidence  of thrombus. Normal compressibility and flow on color Doppler imaging.  Femoral Vein: No evidence of thrombus. Normal compressibility, respiratory phasicity and response to augmentation.  Popliteal Vein: No evidence of thrombus. Normal compressibility, respiratory phasicity and response to augmentation.  Calf Veins: No evidence of thrombus. Normal compressibility and flow on color Doppler imaging.  Superficial Great Saphenous Vein: No evidence of thrombus. Normal compressibility and flow on color Doppler imaging.  Venous Reflux:  None.  Other Findings: Mild diffuse soft tissue edema is noted along the left calf.  IMPRESSION: 1. No evidence of deep venous thrombosis. 2. Mild diffuse soft tissue edema along the right popliteal region and calf, and along the left calf.   Electronically Signed   By: Garald Balding M.D.   On: 12/21/2014 17:56     Management plans discussed with the patient, family and they are in agreement.  CODE STATUS:     Code Status Orders        Start     Ordered   12/21/14 2152  Full code   Continuous     12/21/14 2151      TOTAL TIME TAKING CARE OF THIS PATIENT: 40 minutes.    Breta Demedeiros M.D on 12/22/2014 at 9:24 AM  Between 7am  to 6pm - Pager - [phone] After 6pm go to [email or web address] - password [email or web address] [facility]  [facility]  Office  [phone]  CC: Primary care physician; No PCP Per Patient

## 2014-12-23 ENCOUNTER — Other Ambulatory Visit: Payer: Medicare Other

## 2014-12-26 ENCOUNTER — Ambulatory Visit: Payer: Medicare Other

## 2014-12-29 ENCOUNTER — Other Ambulatory Visit: Payer: Medicare Other

## 2014-12-29 ENCOUNTER — Ambulatory Visit: Payer: Medicare Other | Admitting: Oncology

## 2015-01-12 ENCOUNTER — Telehealth: Payer: Self-pay | Admitting: Family

## 2015-01-12 ENCOUNTER — Ambulatory Visit: Payer: Medicare Other | Admitting: Family

## 2015-01-12 NOTE — Telephone Encounter (Signed)
Patient had an initial appointment at the outpatient [facility] today (01/12/15). She had left a voicemail cancelling the appointment. Will call patient back to reschedule.

## 2015-01-16 ENCOUNTER — Other Ambulatory Visit: Payer: Self-pay | Admitting: Oncology

## 2015-01-26 ENCOUNTER — Encounter: Payer: Self-pay | Admitting: Emergency Medicine

## 2015-01-26 ENCOUNTER — Emergency Department: Payer: Medicare Other

## 2015-01-26 ENCOUNTER — Inpatient Hospital Stay
Admission: EM | Admit: 2015-01-26 | Discharge: 2015-01-30 | DRG: 291 | Disposition: A | Discharge status: Skilled Nursing Facility | Payer: Medicare Other | Attending: Internal Medicine | Admitting: Internal Medicine

## 2015-01-26 DIAGNOSIS — Z96641 Presence of right artificial hip joint: Secondary | ICD-10-CM | POA: Diagnosis present

## 2015-01-26 DIAGNOSIS — I214 Non-ST elevation (NSTEMI) myocardial infarction: Secondary | ICD-10-CM | POA: Diagnosis not present

## 2015-01-26 DIAGNOSIS — L89153 Pressure ulcer of sacral region, stage 3: Secondary | ICD-10-CM | POA: Diagnosis not present

## 2015-01-26 DIAGNOSIS — Z8673 Personal history of transient ischemic attack (TIA), and cerebral infarction without residual deficits: Secondary | ICD-10-CM | POA: Diagnosis not present

## 2015-01-26 DIAGNOSIS — Z8041 Family history of malignant neoplasm of ovary: Secondary | ICD-10-CM

## 2015-01-26 DIAGNOSIS — I480 Paroxysmal atrial fibrillation: Secondary | ICD-10-CM | POA: Diagnosis present

## 2015-01-26 DIAGNOSIS — B961 Klebsiella pneumoniae [K. pneumoniae] as the cause of diseases classified elsewhere: Secondary | ICD-10-CM | POA: Diagnosis not present

## 2015-01-26 DIAGNOSIS — J45901 Unspecified asthma with (acute) exacerbation: Secondary | ICD-10-CM | POA: Diagnosis not present

## 2015-01-26 DIAGNOSIS — I340 Nonrheumatic mitral (valve) insufficiency: Secondary | ICD-10-CM | POA: Diagnosis present

## 2015-01-26 DIAGNOSIS — B9620 Unspecified Escherichia coli [E. coli] as the cause of diseases classified elsewhere: Secondary | ICD-10-CM | POA: Diagnosis not present

## 2015-01-26 DIAGNOSIS — I509 Heart failure, unspecified: Secondary | ICD-10-CM

## 2015-01-26 DIAGNOSIS — N390 Urinary tract infection, site not specified: Secondary | ICD-10-CM | POA: Diagnosis not present

## 2015-01-26 DIAGNOSIS — Z902 Acquired absence of lung [part of]: Secondary | ICD-10-CM | POA: Diagnosis not present

## 2015-01-26 DIAGNOSIS — Z8249 Family history of ischemic heart disease and other diseases of the circulatory system: Secondary | ICD-10-CM

## 2015-01-26 DIAGNOSIS — Z85118 Personal history of other malignant neoplasm of bronchus and lung: Secondary | ICD-10-CM | POA: Diagnosis not present

## 2015-01-26 DIAGNOSIS — R7989 Other specified abnormal findings of blood chemistry: Secondary | ICD-10-CM | POA: Diagnosis not present

## 2015-01-26 DIAGNOSIS — I129 Hypertensive chronic kidney disease with stage 1 through stage 4 chronic kidney disease, or unspecified chronic kidney disease: Secondary | ICD-10-CM | POA: Diagnosis present

## 2015-01-26 DIAGNOSIS — Z808 Family history of malignant neoplasm of other organs or systems: Secondary | ICD-10-CM | POA: Diagnosis not present

## 2015-01-26 DIAGNOSIS — Z9889 Other specified postprocedural states: Secondary | ICD-10-CM

## 2015-01-26 DIAGNOSIS — Y92129 Unspecified place in nursing home as the place of occurrence of the external cause: Secondary | ICD-10-CM | POA: Diagnosis not present

## 2015-01-26 DIAGNOSIS — R609 Edema, unspecified: Secondary | ICD-10-CM

## 2015-01-26 DIAGNOSIS — Z8505 Personal history of malignant neoplasm of liver: Secondary | ICD-10-CM

## 2015-01-26 DIAGNOSIS — R748 Abnormal levels of other serum enzymes: Secondary | ICD-10-CM | POA: Diagnosis not present

## 2015-01-26 DIAGNOSIS — W19XXXA Unspecified fall, initial encounter: Secondary | ICD-10-CM

## 2015-01-26 DIAGNOSIS — Z7982 Long term (current) use of aspirin: Secondary | ICD-10-CM | POA: Diagnosis not present

## 2015-01-26 DIAGNOSIS — I5023 Acute on chronic systolic (congestive) heart failure: Principal | ICD-10-CM | POA: Diagnosis present

## 2015-01-26 DIAGNOSIS — M21372 Foot drop, left foot: Secondary | ICD-10-CM | POA: Diagnosis not present

## 2015-01-26 DIAGNOSIS — Z801 Family history of malignant neoplasm of trachea, bronchus and lung: Secondary | ICD-10-CM | POA: Diagnosis not present

## 2015-01-26 DIAGNOSIS — Z888 Allergy status to other drugs, medicaments and biological substances status: Secondary | ICD-10-CM | POA: Diagnosis not present

## 2015-01-26 DIAGNOSIS — Z79899 Other long term (current) drug therapy: Secondary | ICD-10-CM | POA: Diagnosis not present

## 2015-01-26 DIAGNOSIS — N183 Chronic kidney disease, stage 3 (moderate): Secondary | ICD-10-CM | POA: Diagnosis not present

## 2015-01-26 DIAGNOSIS — S3992XA Unspecified injury of lower back, initial encounter: Secondary | ICD-10-CM

## 2015-01-26 DIAGNOSIS — M7989 Other specified soft tissue disorders: Secondary | ICD-10-CM | POA: Diagnosis present

## 2015-01-26 DIAGNOSIS — L8990 Pressure ulcer of unspecified site, unspecified stage: Secondary | ICD-10-CM | POA: Insufficient documentation

## 2015-01-26 DIAGNOSIS — R778 Other specified abnormalities of plasma proteins: Secondary | ICD-10-CM | POA: Insufficient documentation

## 2015-01-26 DIAGNOSIS — J449 Chronic obstructive pulmonary disease, unspecified: Secondary | ICD-10-CM | POA: Diagnosis present

## 2015-01-26 DIAGNOSIS — R262 Difficulty in walking, not elsewhere classified: Secondary | ICD-10-CM

## 2015-01-26 DIAGNOSIS — N3090 Cystitis, unspecified without hematuria: Secondary | ICD-10-CM

## 2015-01-26 HISTORY — DX: Incomplete rotator cuff tear or rupture of left shoulder, not specified as traumatic: M75.112

## 2015-01-26 HISTORY — DX: Cerebral infarction, unspecified: I63.9

## 2015-01-26 HISTORY — DX: Incomplete rotator cuff tear or rupture of left shoulder, not specified as traumatic: M75.111

## 2015-01-26 HISTORY — DX: Foot drop, left foot: M21.372

## 2015-01-26 HISTORY — DX: Heart failure, unspecified: I50.9

## 2015-01-26 LAB — PROTIME-INR
INR: 1.1
Prothrombin Time: 14.4 seconds (ref 11.4–15.0)

## 2015-01-26 LAB — COMPREHENSIVE METABOLIC PANEL
ALBUMIN: 4.2 g/dL (ref 3.5–5.0)
ALT: 26 U/L (ref 14–54)
AST: 53 U/L — AB (ref 15–41)
Alkaline Phosphatase: 46 U/L (ref 38–126)
Anion gap: 7 (ref 5–15)
BUN: 37 mg/dL — AB (ref 6–20)
CHLORIDE: 103 mmol/L (ref 101–111)
CO2: 32 mmol/L (ref 22–32)
CREATININE: 1.6 mg/dL — AB (ref 0.44–1.00)
Calcium: 9.3 mg/dL (ref 8.9–10.3)
GFR calc Af Amer: 34 mL/min — ABNORMAL LOW (ref >60)
GFR calc non Af Amer: 30 mL/min — ABNORMAL LOW (ref >60)
GLUCOSE: 82 mg/dL (ref 65–99)
Potassium: 4.1 mmol/L (ref 3.5–5.1)
SODIUM: 142 mmol/L (ref 135–145)
Total Bilirubin: 0.8 mg/dL (ref 0.3–1.2)
Total Protein: 6.8 g/dL (ref 6.5–8.1)

## 2015-01-26 LAB — URINALYSIS COMPLETE WITH MICROSCOPIC (ARMC ONLY)
Bilirubin Urine: NEGATIVE
Glucose, UA: NEGATIVE mg/dL
KETONES UR: NEGATIVE mg/dL
Nitrite: NEGATIVE
PROTEIN: 30 mg/dL — AB
Specific Gravity, Urine: 1.011 (ref 1.005–1.030)
pH: 6 (ref 5.0–8.0)

## 2015-01-26 LAB — CBC WITH DIFFERENTIAL/PLATELET
Basophils Absolute: 0.1 10*3/uL (ref 0–0.1)
Basophils Relative: 1 %
EOS ABS: 0.2 10*3/uL (ref 0–0.7)
EOS PCT: 2 %
HCT: 29.7 % — ABNORMAL LOW (ref 35.0–47.0)
Hemoglobin: 9.7 g/dL — ABNORMAL LOW (ref 12.0–16.0)
LYMPHS ABS: 1 10*3/uL (ref 1.0–3.6)
Lymphocytes Relative: 12 %
MCH: 28.7 pg (ref 26.0–34.0)
MCHC: 32.7 g/dL (ref 32.0–36.0)
MCV: 87.8 fL (ref 80.0–100.0)
MONO ABS: 0.6 10*3/uL (ref 0.2–0.9)
MONOS PCT: 7 %
Neutro Abs: 6.6 10*3/uL — ABNORMAL HIGH (ref 1.4–6.5)
Neutrophils Relative %: 78 %
Platelets: 135 10*3/uL — ABNORMAL LOW (ref 150–440)
RBC: 3.38 MIL/uL — AB (ref 3.80–5.20)
RDW: 12.7 % (ref 11.5–14.5)
WBC: 8.5 10*3/uL (ref 3.6–11.0)

## 2015-01-26 LAB — TROPONIN I
TROPONIN I: 0.34 ng/mL — AB (ref <0.031)
TROPONIN I: 0.38 ng/mL — AB (ref <0.031)
Troponin I: 0.4 ng/mL — ABNORMAL HIGH (ref <0.031)

## 2015-01-26 LAB — BRAIN NATRIURETIC PEPTIDE: B Natriuretic Peptide: 1603 pg/mL — ABNORMAL HIGH (ref 0.0–100.0)

## 2015-01-26 LAB — DIGOXIN LEVEL: Digoxin Level: 0.9 ng/mL (ref 0.8–2.0)

## 2015-01-26 LAB — MRSA PCR SCREENING: MRSA by PCR: NEGATIVE

## 2015-01-26 MED ORDER — FUROSEMIDE 10 MG/ML IJ SOLN
40.0000 mg | Freq: Two times a day (BID) | INTRAMUSCULAR | Status: DC
  Filled 2015-01-26: qty 4

## 2015-01-26 MED ORDER — IMATINIB MESYLATE 100 MG PO TABS
400.0000 mg | ORAL_TABLET | Freq: Every day | ORAL | Status: DC
  Administered 2015-01-27 – 2015-01-29 (×2): 400 mg via ORAL
  Filled 2015-01-26: qty 4

## 2015-01-26 MED ORDER — SULFAMETHOXAZOLE-TRIMETHOPRIM 800-160 MG PO TABS: 1.0000 | ORAL_TABLET | Freq: Once | ORAL | Status: AC

## 2015-01-26 MED ORDER — DEXTROSE 5 % IV SOLN
1.0000 g | Freq: Once | INTRAVENOUS | Status: AC
  Administered 2015-01-26: 1 g via INTRAVENOUS
  Filled 2015-01-26: qty 10

## 2015-01-26 MED ORDER — FUROSEMIDE 10 MG/ML IJ SOLN
80.0000 mg | Freq: Once | INTRAMUSCULAR | Status: AC
  Administered 2015-01-26: 80 mg via INTRAVENOUS
  Filled 2015-01-26: qty 8

## 2015-01-26 MED ORDER — SODIUM CHLORIDE 0.9 % IJ SOLN: 3.0000 mL | Freq: Two times a day (BID) | INTRAMUSCULAR | Status: DC

## 2015-01-26 MED ORDER — ENOXAPARIN SODIUM 30 MG/0.3ML ~~LOC~~ SOLN
30.0000 mg | SUBCUTANEOUS | Status: DC
  Administered 2015-01-27 – 2015-01-29 (×4): 30 mg via SUBCUTANEOUS
  Filled 2015-01-26 (×4): qty 0.3

## 2015-01-26 MED ORDER — METOLAZONE 5 MG PO TABS
5.0000 mg | ORAL_TABLET | ORAL | Status: DC
  Administered 2015-01-26: 5 mg via ORAL
  Filled 2015-01-26 (×2): qty 1

## 2015-01-26 MED ORDER — LEVALBUTEROL HCL 1.25 MG/3ML IN NEBU
1.2500 mg | INHALATION_SOLUTION | Freq: Four times a day (QID) | RESPIRATORY_TRACT | Status: DC | PRN
  Filled 2015-01-26: qty 3

## 2015-01-26 MED ORDER — SULFAMETHOXAZOLE-TRIMETHOPRIM 800-160 MG PO TABS
1.0000 | ORAL_TABLET | Freq: Once | ORAL | Status: DC
  Administered 2015-01-26: 1 via ORAL
  Filled 2015-01-26: qty 1

## 2015-01-26 MED ORDER — MONTELUKAST SODIUM 10 MG PO TABS
10.0000 mg | ORAL_TABLET | Freq: Every day | ORAL | Status: DC
  Administered 2015-01-26 – 2015-01-29 (×3): 10 mg via ORAL
  Filled 2015-01-26 (×4): qty 1

## 2015-01-26 MED ORDER — OXYCODONE HCL 5 MG PO TABS: 5.0000 mg | ORAL_TABLET | Freq: Three times a day (TID) | ORAL | Status: AC | PRN

## 2015-01-26 MED ORDER — IMATINIB MESYLATE 100 MG PO TABS
400.0000 mg | ORAL_TABLET | Freq: Every day | ORAL | Status: DC
Start: 2015-01-26 — End: 2015-01-26

## 2015-01-26 MED ORDER — DIGOXIN 125 MCG PO TABS
0.1250 mg | ORAL_TABLET | Freq: Every day | ORAL | Status: DC
  Administered 2015-01-27 – 2015-01-30 (×4): 0.125 mg via ORAL
  Filled 2015-01-26 (×4): qty 1

## 2015-01-26 MED ORDER — THEOPHYLLINE ER 100 MG PO TB12
200.0000 mg | ORAL_TABLET | Freq: Two times a day (BID) | ORAL | Status: DC
  Administered 2015-01-27 – 2015-01-30 (×6): 200 mg via ORAL
  Filled 2015-01-26 (×7): qty 2

## 2015-01-26 MED ORDER — PREDNISONE 10 MG PO TABS
10.0000 mg | ORAL_TABLET | ORAL | Status: DC
  Administered 2015-01-26 – 2015-01-30 (×3): 10 mg via ORAL
  Filled 2015-01-26 (×3): qty 1

## 2015-01-26 MED ORDER — BRIMONIDINE TARTRATE 0.15 % OP SOLN
1.0000 [drp] | Freq: Two times a day (BID) | OPHTHALMIC | Status: DC
  Administered 2015-01-26 – 2015-01-30 (×8): 1 [drp] via OPHTHALMIC
  Filled 2015-01-26: qty 5

## 2015-01-26 MED ORDER — BRINZOLAMIDE 1 % OP SUSP
1.0000 [drp] | Freq: Two times a day (BID) | OPHTHALMIC | Status: DC
  Administered 2015-01-26 – 2015-01-30 (×8): 1 [drp] via OPHTHALMIC
  Filled 2015-01-26: qty 10

## 2015-01-26 MED ORDER — LATANOPROST 0.005 % OP SOLN
1.0000 [drp] | Freq: Every day | OPHTHALMIC | Status: DC
Start: 2015-01-26 — End: 2015-01-30
  Administered 2015-01-26 – 2015-01-29 (×4): 1 [drp] via OPHTHALMIC
  Filled 2015-01-26: qty 2.5

## 2015-01-26 MED ORDER — BUDESONIDE-FORMOTEROL FUMARATE 160-4.5 MCG/ACT IN AERO
2.0000 | INHALATION_SPRAY | Freq: Two times a day (BID) | RESPIRATORY_TRACT | Status: DC
  Administered 2015-01-26 – 2015-01-30 (×8): 2 via RESPIRATORY_TRACT
  Filled 2015-01-26: qty 6

## 2015-01-26 MED ORDER — VITAMIN D (ERGOCALCIFEROL) 1.25 MG (50000 UNIT) PO CAPS
50000.0000 [IU] | ORAL_CAPSULE | ORAL | Status: DC
  Administered 2015-01-26: 50000 [IU] via ORAL
  Filled 2015-01-26: qty 1

## 2015-01-26 MED ORDER — CEFTRIAXONE SODIUM 1 G IJ SOLR
1.0000 g | INTRAMUSCULAR | Status: DC
  Filled 2015-01-26 (×2): qty 10

## 2015-01-26 MED ORDER — DIAZEPAM 5 MG PO TABS: 5.0000 mg | ORAL_TABLET | Freq: Two times a day (BID) | ORAL | Status: DC | PRN

## 2015-01-26 MED ORDER — SULFAMETHOXAZOLE-TRIMETHOPRIM 800-160 MG PO TABS: 1.0000 | ORAL_TABLET | Freq: Two times a day (BID) | ORAL | Status: DC

## 2015-01-26 MED ORDER — MORPHINE SULFATE (PF) 4 MG/ML IV SOLN
4.0000 mg | Freq: Once | INTRAVENOUS | Status: AC
  Administered 2015-01-26: 4 mg via INTRAVENOUS
  Filled 2015-01-26: qty 1

## 2015-01-26 MED ORDER — POLYSACCHARIDE IRON COMPLEX 150 MG PO CAPS
300.0000 mg | ORAL_CAPSULE | Freq: Every day | ORAL | Status: DC
  Administered 2015-01-26 – 2015-01-30 (×5): 300 mg via ORAL
  Filled 2015-01-26 (×5): qty 2

## 2015-01-26 MED ORDER — ASPIRIN EC 325 MG PO TBEC
325.0000 mg | DELAYED_RELEASE_TABLET | Freq: Every day | ORAL | Status: DC
  Administered 2015-01-26 – 2015-01-30 (×5): 325 mg via ORAL
  Filled 2015-01-26 (×5): qty 1

## 2015-01-26 MED ORDER — TRAMADOL HCL 50 MG PO TABS
50.0000 mg | ORAL_TABLET | Freq: Three times a day (TID) | ORAL | Status: DC | PRN
  Administered 2015-01-26 – 2015-01-29 (×6): 50 mg via ORAL
  Filled 2015-01-26 (×6): qty 1

## 2015-01-26 MED ORDER — LEVALBUTEROL TARTRATE 45 MCG/ACT IN AERO: 2.0000 | INHALATION_SPRAY | RESPIRATORY_TRACT | Status: DC | PRN

## 2015-01-26 NOTE — ED Provider Notes (Signed)
[facility] Emergency Department Provider Note     Time seen: ----------------------------------------- 9:31 AM on 01/26/2015 -----------------------------------------    I have reviewed the triage vital signs and the nursing notes.   HISTORY  Chief Complaint No chief complaint on file.    HPI [name] is a 81 y.o. female who presents to ER after a fall. Patient reports she fell backwards and landed on her backside. She is complaining of pain in her low back and pelvis. Patient is also describes shortness of breath. She has taken herself off her Lasix because she is taking baclofen and now she can't hold her urine. She does have history of liver cancer is currently patient of Dr. Oliva Bustard.    Past Medical History  Diagnosis Date  . Asthma   . Anxiety   . Hypertension   . Hyperlipidemia   . CVA (cerebral infarction)   . Paroxysmal a-fib   . Renal insufficiency   . COPD (chronic obstructive pulmonary disease)   . Mitral valve insufficiency   . Cancer     lung    Patient Active Problem List   Diagnosis Date Noted  . CHF (congestive heart failure), NYHA class II 12/21/2014  . 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 Frank hematuria 11/14/2014  . Physical deconditioning 06/09/2014  . Asthma with acute exacerbation 06/09/2014  . Acute on chronic diastolic congestive heart failure 06/09/2014  . Heartburn 06/09/2014  . Stage 2 skin ulcer of sacral region 06/09/2014  . Ischemic toe ulcer 06/09/2014  . Paroxysmal atrial fibrillation 06/09/2014  . Essential hypertension 06/09/2014  . Gastrointestinal stromal tumor (GIST) 06/09/2014  . Chronic diarrhea 06/09/2014    Past Surgical History  Procedure Laterality Date  . Back surgery    . Joint replacement      Allergies Ibuprofen; Motrin pm; and Tylenol  Social History Social History  Substance Use Topics  . Smoking status: Never Smoker   . Smokeless tobacco: Never Used  .  Alcohol Use: No    Review of Systems Constitutional: Negative for fever. Eyes: Negative for visual changes. ENT: Negative for sore throat. Cardiovascular: Negative for chest pain. Respiratory: Positive shortness of breath Gastrointestinal: Negative for abdominal pain, vomiting and diarrhea. Genitourinary: Negative for dysuria. Musculoskeletal: Positive for low back pain Skin: Negative for rash. Neurological: Negative for headaches, focal weakness or numbness.  10-point ROS otherwise negative.  ____________________________________________   PHYSICAL EXAM:  VITAL SIGNS: ED Triage Vitals  Enc Vitals Group     BP --      Pulse --      Resp --      Temp --      Temp src --      SpO2 --      Weight --      Height --      Head Cir --      Peak Flow --      Pain Score --      Pain Loc --      Pain Edu? --      Excl. in [place]? --     Constitutional: Alert and oriented. Well appearing and in no distress. Eyes: Conjunctivae are normal. PERRL. Normal extraocular movements. ENT   Head: Normocephalic and atraumatic. Facial edema is noted   Nose: No congestion/rhinnorhea.   Mouth/Throat: Mucous membranes are moist.   Neck: No stridor. Cardiovascular: Irregularly irregular rhythm. Normal and symmetric distal pulses are present in all extremities. No murmurs, rubs, or gallops. Respiratory: Normal  respiratory effort without tachypnea nor retractions. Breath sounds are clear and equal bilaterally. No wheezes/rales/rhonchi. Gastrointestinal: Soft and nontender. No distention. No abdominal bruits.  Musculoskeletal: There is low back and sacral tenderness. No joint effusions. Bilateral lower extremity edema Neurologic:  Normal speech and language. No gross focal neurologic deficits are appreciated. Speech is normal. No gait instability. Skin:  Skin is warm, dry and intact. No rash noted. Psychiatric: Mood and affect are normal. Speech and behavior are normal. Patient exhibits  appropriate insight and judgment. ____________________________________________  EKG: Interpreted by me. Atrial fibrillation with PVCs, left axis deviation, low voltage, no evidence of acute infarction.  ____________________________________________  ED COURSE:  Pertinent labs & imaging results that were available during my care of the patient were reviewed by me and considered in my medical decision making (see chart for details). Patient will need imaging of her low back as well as assessment for dyspnea. ____________________________________________    LABS (pertinent positives/negatives)  Labs Reviewed  CBC WITH DIFFERENTIAL/PLATELET - Abnormal; Notable for the following:    RBC 3.38 (*)    Hemoglobin 9.7 (*)    HCT 29.7 (*)    Platelets 135 (*)    Neutro Abs 6.6 (*)    All other components within normal limits  BRAIN NATRIURETIC PEPTIDE - Abnormal; Notable for the following:    B Natriuretic Peptide 1603.0 (*)    All other components within normal limits  PROTIME-INR  TROPONIN I  COMPREHENSIVE METABOLIC PANEL  DIGOXIN LEVEL    RADIOLOGY Images were viewed by me  Chest x-ray IMPRESSION: Stable cardiomegaly. No acute pulmonary disease is noted.  IMPRESSION: Multiple levels of degenerative change in the lumbar spine are similar to comparison CT of 11/14/2014. No acute findings.  IMPRESSION: Multiple levels of degenerative change in the lumbar spine are similar to comparison CT of 11/14/2014. No acute findings.  ____________________________________________  FINAL ASSESSMENT AND PLAN  Fall, low back pain, dyspnea  Plan: Patient with labs and imaging as dictated above. Stable chronic lab abnormalities with chronically elevated troponin. She was given 80 mg IV Lasix here and diuresed over a liter. She did not sustain any acute fractures from the fall, does not meet criteria for admission in the hospital. She'll be stable for outpatient follow-up.   Earleen Newport, MD   Earleen Newport, MD 01/26/15 1225

## 2015-01-26 NOTE — Progress Notes (Signed)
Pt is currently refusing PICC line. She feels as though she is dehydrated and if we give her time to rehydrate we will be able to successfully place a PIV. I notified Dr Wray Kearns as well as [facility]. Pt may consider placement tomorrow 9/23.

## 2015-01-26 NOTE — Progress Notes (Signed)
[facility] notified of need for PICC line placement.

## 2015-01-26 NOTE — Progress Notes (Signed)
Patient ID: [name], female   DOB: [birth year], 81 y.o.   MRN: [id number] Patient lost IV access. I put in a PICC line order. Not a great candidate for central line because of her size. I would rather do a PICC line. Dictated by Dr. Loletha Grayer

## 2015-01-26 NOTE — H&P (Signed)
[facility] [place] NAME: [name]    MR#:  [id number]  DATE OF BIRTH:  09-Jun-1933  DATE OF ADMISSION:  01/26/2015  PRIMARY CARE PHYSICIAN: Drs. making house calls. Dr. Perrin Smack  REQUESTING/REFERRING PHYSICIAN: Dr. Conni Slipper  CHIEF COMPLAINT:   Chief Complaint  Patient presents with  . Fall    HISTORY OF PRESENT ILLNESS:  [name]  is a 81 y.o. female with a known history of CHF, COPD, asthma, liver cancer, chronic kidney disease, atrial fibrillation. She was recently started on baclofen for pain in her legs and then she couldn't walk. She's been very weak and today she actually fell backwards onto her bed. Because she couldn't walk, she stopped taking her fluid pill on Sunday. She's been having increasing swelling in her face and legs. She's also had shortness of breath and wheezing. In the ER, she was unable to walk and she was found to have an acute cystitis and then low-grade congestive heart failure. Hospitalist services were contacted for further evaluation.  PAST MEDICAL HISTORY:   Past Medical History  Diagnosis Date  . Asthma   . Anxiety   . Hypertension   . Hyperlipidemia   . CVA (cerebral infarction)   . Paroxysmal a-fib   . Renal insufficiency   . COPD (chronic obstructive pulmonary disease)   . Mitral valve insufficiency   . Cancer     lung  . CHF (congestive heart failure)   . Stroke   . Left foot drop   . Nontraumatic partial bilateral rotator cuff tear     PAST SURGICAL HISTORY:   Past Surgical History  Procedure Laterality Date  . Back surgery    . Joint replacement    . Liver resection    . Radiation implants to liver    . Lung nodule resection      SOCIAL HISTORY:   Social History  Substance Use Topics  . Smoking status: Never Smoker   . Smokeless tobacco: Never Used  . Alcohol Use: No    FAMILY HISTORY:   Family History  Problem Relation Age of Onset  .  Congestive Heart Failure Mother   . Lung cancer Father   . Ovarian cancer Sister   . Throat cancer Brother     DRUG ALLERGIES:   Allergies  Allergen Reactions  . Ibuprofen Other (See Comments)    Pt states that she can't take because of her kidneys.    . Motrin Pm [Ibuprofen-Diphenhydramine Cit] Other (See Comments)    Pt states that she can't take because of her kidneys.    . Tylenol [Acetaminophen] Other (See Comments)    Pt states that she can't take because it is contraindicated with her Gleevec.      REVIEW OF SYSTEMS:  CONSTITUTIONAL: No fever, positive for fatigue, positive for fluid weight gain EYES: No blurred or double vision.  EARS, NOSE, AND THROAT: No tinnitus or ear pain. No sore throat RESPIRATORY: No cough, positive for shortness of breath, no wheezing or hemoptysis.  CARDIOVASCULAR: No chest pain, orthopnea, edema.  GASTROINTESTINAL: No nausea, vomiting, or abdominal pain. No blood in bowel movements. Positive for diarrhea GENITOURINARY: No dysuria, hematuria.  ENDOCRINE: No polyuria, nocturia,  HEMATOLOGY: No anemia, easy bruising or bleeding SKIN: No rash or lesion. MUSCULOSKELETAL: Positive for leg and shoulder pain. NEUROLOGIC: No tingling, numbness, weakness.  PSYCHIATRY: No anxiety or depression.   MEDICATIONS AT HOME:   Prior to Admission medications  Medication Sig Start Date End Date Taking? Authorizing Lu Paradise  aspirin EC 325 MG tablet Take 325 mg by mouth daily.   Yes Historical Crystalee Ventress, MD  baclofen (LIORESAL) 10 MG tablet Take 1 tablet by mouth 2 (two) times daily as needed. 01/19/15  Yes Historical Kenly Henckel, MD  brimonidine (ALPHAGAN) 0.15 % ophthalmic solution Place 1 drop into both eyes 2 (two) times daily.   Yes Historical Raquel Racey, MD  brinzolamide (AZOPT) 1 % ophthalmic suspension Place 1 drop into both eyes 2 (two) times daily.   Yes Historical Darrold Bezek, MD  budesonide-formoterol (SYMBICORT) 160-4.5 MCG/ACT inhaler Inhale 2 puffs into  the lungs 2 (two) times daily.   Yes Historical Abrina Petz, MD  diazepam (VALIUM) 5 MG tablet Take one tablet by mouth every 12 hours as needed for anxiety/nervousness. Patient taking differently: Take 5 mg by mouth every 12 (twelve) hours as needed for anxiety.  06/07/14  Yes Mahima Bubba Camp, MD  digoxin (LANOXIN) 0.125 MG tablet Take 0.125 mg by mouth daily.    Yes Historical Zaire Levesque, MD  diphenoxylate-atropine (LOMOTIL) 2.5-0.025 MG per tablet Take two tablets by mouth four times daily as needed for diarrhea Patient taking differently: Take 2 tablets by mouth 4 (four) times daily as needed for diarrhea or loose stools.  06/14/14  Yes Estill Dooms, MD  FERREX 150 150 MG capsule TAKE 2 CAPSULES BY MOUTH EVERY DAY 01/16/15  Yes Forest Gleason, MD  imatinib (GLEEVEC) 100 MG tablet Take 400 mg by mouth daily.   Yes Historical Colie Fugitt, MD  latanoprost (XALATAN) 0.005 % ophthalmic solution Place 1 drop into both eyes at bedtime.   Yes Historical Delia Sitar, MD  levalbuterol [facility] HFA) 45 MCG/ACT inhaler Inhale 2 puffs into the lungs every 4 (four) hours as needed for wheezing.   Yes Historical Skii Cleland, MD  levalbuterol (XOPENEX) 1.25 MG/3ML nebulizer solution Take 1.25 mg by nebulization every 6 (six) hours as needed for wheezing.   Yes Historical Carolanne Mercier, MD  metolazone (ZAROXOLYN) 5 MG tablet Take 5 mg by mouth 2 (two) times a week. Pt takes on Tuesday and Friday.   Yes Historical Shawnika Pepin, MD  montelukast (SINGULAIR) 10 MG tablet Take 10 mg by mouth at bedtime.   Yes Historical Kaushal Vannice, MD  predniSONE (DELTASONE) 10 MG tablet Take 10 mg by mouth every other day.   Yes Historical Elianys Conry, MD  theophylline (THEODUR) 200 MG 12 hr tablet Take 200 mg by mouth 2 (two) times daily with a meal.    Yes Historical Aydenn Gervin, MD  torsemide (DEMADEX) 20 MG tablet Take 40 mg by mouth 2 (two) times a week. Pt takes on Tuesday and Friday.   Yes Historical Declan Mier, MD  traMADol (ULTRAM) 50 MG tablet Take two tablets by  mouth three times daily as needed for pain Patient taking differently: Take 50 mg by mouth every 8 (eight) hours as needed for moderate pain.  06/07/14  Yes Blanchie Serve, MD  Vitamin D, Ergocalciferol, (DRISDOL) 50000 UNITS CAPS capsule Take 50,000 Units by mouth every 7 (seven) days.   Yes Historical Linzie Boursiquot, MD  oxyCODONE (ROXICODONE) 5 MG immediate release tablet Take 1 tablet (5 mg total) by mouth every 8 (eight) hours as needed. 01/26/15 01/26/16  Earleen Newport, MD  sulfamethoxazole-trimethoprim (BACTRIM DS) 800-160 MG per tablet Take 1 tablet by mouth 2 (two) times daily. 01/26/15   Earleen Newport, MD      VITAL SIGNS:  Blood pressure 118/82, pulse 86, temperature 98 F (36.7 C), temperature source Oral, resp.  rate 18, height 5\' 5"  (1.651 m), weight 58.06 kg (128 lb), SpO2 98 %.  PHYSICAL EXAMINATION:  GENERAL:  81 y.o.-year-old patient lying in the bed with no acute distress.  EYES: Pupils equal, round, reactive to light and accommodation. No scleral icterus. Extraocular muscles intact.  HEENT: Head atraumatic, normocephalic. Oropharynx and nasopharynx clear. Facial and eyelid swelling  NECK:  Supple, no jugular venous distention. No thyroid enlargement, no tenderness.  LUNGS: Decreased breath sounds bilaterally, slight expiratory upper airway wheezing, rales at bilateral bases. No use of accessory muscles of respiration.  CARDIOVASCULAR: S1, S2 irregularly irregular. No murmurs, rubs, or gallops.  ABDOMEN: Soft, nontender, nondistended. Bowel sounds present. No organomegaly or mass.  EXTREMITIES: 3+ edema, no cyanosis, or clubbing.  NEUROLOGIC: Cranial nerves II through XII are intact. Patient unable to lift either leg up off the bed. Left foot drop from previous surgery. PSYCHIATRIC: The patient is alert and oriented x 3.  SKIN: No rash, lesion, or ulcer.   LABORATORY PANEL:   CBC  Recent Labs Lab 01/26/15 1042  WBC 8.5  HGB 9.7*  HCT 29.7*  PLT 135*    ------------------------------------------------------------------------------------------------------------------  Chemistries   Recent Labs Lab 01/26/15 1042  NA 142  K 4.1  CL 103  CO2 32  GLUCOSE 82  BUN 37*  CREATININE 1.60*  CALCIUM 9.3  AST 53*  ALT 26  ALKPHOS 46  BILITOT 0.8   ------------------------------------------------------------------------------------------------------------------  Cardiac Enzymes  Recent Labs Lab 01/26/15 1042  TROPONINI 0.34*   ------------------------------------------------------------------------------------------------------------------  RADIOLOGY:  Dg Chest 2 View  01/26/2015   CLINICAL DATA:  Dyspnea after fall.  EXAM: CHEST  2 VIEW  COMPARISON:  December 21, 2014.  FINDINGS: Stable cardiomegaly. No pneumothorax or pleural effusion is noted. No acute pulmonary disease is noted. Severe degenerative changes seen involving the right glenohumeral joint.  IMPRESSION: Stable cardiomegaly.  No acute pulmonary disease is noted.   Electronically Signed   By: Marijo Conception, M.D.   On: 01/26/2015 10:19   Dg Lumbar Spine 2-3 Views  01/26/2015   CLINICAL DATA:  [name] nursing home patient. Fall while walking 2 bathroom with walker. Low back pain and sacral pain.  EXAM: LUMBAR SPINE - 2-3 VIEW  COMPARISON:  CT 11/14/2014  FINDINGS: Essentially normal alignment of the lumbar vertebral bodies. There is joint space widening at L3-L4 similar to comparison CT. There is joint space narrowing at L1-L2, L2-L3 and L4-L5. There is mild anterolisthesis of L3 on L4 by 5 mm which is also unchanged from comparison CT. On the frontal projection there is mild dextroscoliosis.  IMPRESSION: Multiple levels of degenerative change in the lumbar spine are similar to comparison CT of 11/14/2014. No acute findings.   Electronically Signed   By: Suzy Bouchard M.D.   On: 01/26/2015 10:21   Dg Sacrum/coccyx  01/26/2015   CLINICAL DATA:  Dyspnea, fall, low back  pain  EXAM: SACRUM AND COCCYX - 2+ VIEW  COMPARISON:  None.  FINDINGS: There is generalized osteopenia. The sacrum is limited evaluation secondary to overlying bowel gas. No gross sacral fracture.  Right total hip arthroplasty. Moderate osteoarthritis of the left hip. Mild degenerative changes of bilateral sacroiliac joints. Degenerative disc disease at L4-5 and L5-S1 with bilateral facet arthropathy.  There is peripheral vascular atherosclerotic disease.  IMPRESSION: No acute osseous injury of the of the sacrum.   Electronically Signed   By: Kathreen Devoid   On: 01/26/2015 10:20    EKG:   A. fib  80 bpm left axis deviation low voltage  IMPRESSION AND PLAN:   1. Acute congestive heart failure with increased leg swelling and facial swelling. Patient was given a dose of 80 mg IV Lasix in the ER. I will give 40 mg IV twice a day. I will obtain an echocardiogram. With her history of asthma COPD I will hold off on beta blocker at this point. 2. Acute cystitis with hematuria- urine culture sent off. IV Rocephin.  3. Profound weakness- I will stop baclofen at this point. Get physical therapy evaluation. Get social worker evaluation to put out feelers to rehabs. Patient normally walks with a walker. 4. Atrial fibrillation rate controlled- patient is on digoxin for rate control. And only aspirin for anticoagulation 5. Chronic kidney disease stage III- watch closely with diuresis 6. COPD- wears oxygen at night 7. History of liver cancer 8. History of CVA   All the records are reviewed and case discussed with ED Jeweldean Drohan. Management plans discussed with the patient, family and they are in agreement.  CODE STATUS: Full code  TOTAL TIME TAKING CARE OF THIS PATIENT: 50 minutes.    Loletha Grayer M.D on 01/26/2015 at 5:28 PM  Between 7am to 6pm - Pager - [phone]  After 6pm call admission pager [facility]  Office  [phone]  CC: Primary care physician; Pcp Not In  System

## 2015-01-26 NOTE — ED Notes (Signed)
Patient transported to X-ray 

## 2015-01-26 NOTE — ED Notes (Signed)
Pts catheter removed as well as IV. D/C medications administered and pt dressed in normal cloths. When two RNs attempted to stand pt. Pt began wheezing and was unable to stand for more than 30 seconds. Lung sounds where diminished and MD was notified of event. Pt placed back on oxygen and is now lying in bed awaiting MD to reassess.

## 2015-01-26 NOTE — Progress Notes (Signed)
Patient lost IV access upon transfer to 2A. RN attempted once, charge nurse attempted twice, per ED RN, three attempts while patient was still in emergency room. On call MD paged, and RN received order for PICC line insertion.

## 2015-01-26 NOTE — ED Notes (Signed)
MD made aware of critical value.  Troponin 0.32

## 2015-01-26 NOTE — ED Notes (Signed)
Ems pt from [facility], fall while walking to bathroom ( with a walker ) , left knee gave out fell backwards landing on her bottom, no head trauma, pt on 3 liters [place] o2 , wheezing noted , swelling to bilateral lower legs and face, pt is suppose  to be taking lasix, has not for 1 week. Pt alert and oriented x4 ,

## 2015-01-26 NOTE — ED Provider Notes (Signed)
Patient ready for discharge however when she sat up she began wheezing a lot and then was too weak to stand. I went and reexamined the patient she does have crackles in her lungs and edema in her legs patient is unable to stand even with my assistance and the assistance of one her her friends. She lives at independent living in [place] by herself and has no one to care for her and again cannot stand. We'll recommend admission for congestive heart failure UTI and re-elevation of the troponin  Nena Polio, MD 01/26/15 [phone]

## 2015-01-27 ENCOUNTER — Inpatient Hospital Stay: Payer: Medicare Other

## 2015-01-27 ENCOUNTER — Inpatient Hospital Stay (HOSPITAL_COMMUNITY)
Admit: 2015-01-27 | Discharge: 2015-01-27 | Disposition: A | Discharge status: Home / Self Care | Payer: Medicare Other | Attending: Internal Medicine | Admitting: Internal Medicine

## 2015-01-27 DIAGNOSIS — L8990 Pressure ulcer of unspecified site, unspecified stage: Secondary | ICD-10-CM | POA: Insufficient documentation

## 2015-01-27 DIAGNOSIS — I340 Nonrheumatic mitral (valve) insufficiency: Secondary | ICD-10-CM

## 2015-01-27 LAB — CBC
HEMATOCRIT: 32.5 % — AB (ref 35.0–47.0)
Hemoglobin: 10.5 g/dL — ABNORMAL LOW (ref 12.0–16.0)
MCH: 28.2 pg (ref 26.0–34.0)
MCHC: 32.2 g/dL (ref 32.0–36.0)
MCV: 87.5 fL (ref 80.0–100.0)
Platelets: 138 10*3/uL — ABNORMAL LOW (ref 150–440)
RBC: 3.71 MIL/uL — ABNORMAL LOW (ref 3.80–5.20)
RDW: 12.9 % (ref 11.5–14.5)
WBC: 11.6 10*3/uL — ABNORMAL HIGH (ref 3.6–11.0)

## 2015-01-27 LAB — BASIC METABOLIC PANEL
Anion gap: 9 (ref 5–15)
BUN: 35 mg/dL — AB (ref 6–20)
CHLORIDE: 103 mmol/L (ref 101–111)
CO2: 30 mmol/L (ref 22–32)
Calcium: 8.6 mg/dL — ABNORMAL LOW (ref 8.9–10.3)
Creatinine, Ser: 1.54 mg/dL — ABNORMAL HIGH (ref 0.44–1.00)
GFR calc Af Amer: 36 mL/min — ABNORMAL LOW (ref >60)
GFR, EST NON AFRICAN AMERICAN: 31 mL/min — AB (ref >60)
GLUCOSE: 84 mg/dL (ref 65–99)
POTASSIUM: 4.2 mmol/L (ref 3.5–5.1)
Sodium: 142 mmol/L (ref 135–145)

## 2015-01-27 MED ORDER — CEPHALEXIN 250 MG PO CAPS
250.0000 mg | ORAL_CAPSULE | Freq: Three times a day (TID) | ORAL | Status: DC
  Administered 2015-01-27 – 2015-01-30 (×9): 250 mg via ORAL
  Filled 2015-01-27 (×9): qty 1

## 2015-01-27 MED ORDER — TORSEMIDE 20 MG PO TABS
40.0000 mg | ORAL_TABLET | Freq: Two times a day (BID) | ORAL | Status: DC
  Administered 2015-01-27 – 2015-01-30 (×5): 40 mg via ORAL
  Filled 2015-01-27 (×6): qty 2

## 2015-01-27 MED ORDER — AZITHROMYCIN 250 MG PO TABS
500.0000 mg | ORAL_TABLET | Freq: Every day | ORAL | Status: AC
  Administered 2015-01-27: 500 mg via ORAL
  Filled 2015-01-27: qty 2

## 2015-01-27 MED ORDER — PREDNISONE 50 MG PO TABS
50.0000 mg | ORAL_TABLET | Freq: Every day | ORAL | Status: DC
  Administered 2015-01-28 – 2015-01-30 (×3): 50 mg via ORAL
  Filled 2015-01-27 (×3): qty 1

## 2015-01-27 MED ORDER — IPRATROPIUM-ALBUTEROL 0.5-2.5 (3) MG/3ML IN SOLN
3.0000 mL | RESPIRATORY_TRACT | Status: DC
  Administered 2015-01-27 – 2015-01-28 (×3): 3 mL via RESPIRATORY_TRACT
  Filled 2015-01-27 (×4): qty 3

## 2015-01-27 MED ORDER — ENSURE ENLIVE PO LIQD
237.0000 mL | Freq: Two times a day (BID) | ORAL | Status: DC
  Administered 2015-01-27 – 2015-01-29 (×4): 237 mL via ORAL

## 2015-01-27 MED ORDER — AZITHROMYCIN 250 MG PO TABS
250.0000 mg | ORAL_TABLET | Freq: Every day | ORAL | Status: DC
  Administered 2015-01-28 – 2015-01-30 (×3): 250 mg via ORAL
  Filled 2015-01-27 (×3): qty 1

## 2015-01-27 NOTE — Progress Notes (Addendum)
[facility] [place] NAME: [name]    MR#:  [id number]  DATE OF BIRTH:  01-01-1934  SUBJECTIVE:  CHIEF COMPLAINT:   Chief Complaint  Patient presents with  . Fall   patient is 81 year old female with history of COPD, asthma, as well as CHF who presents to the hospital with shortness of breath. When the patient, she has been having shortness of breath over the past 3 or 4 days, severe dyspnea on exertion. Denies any significant cough or wheezing but admits of lower extremity swelling and cough pain, especially in the lower left extremity. She was given baclofen by her primary care physician and developed severe weakness, now she cannot even get up from the bed. She has not taken torsemide over the past few days due to inability to get to the bathroom, she is interested in rehabilitation facility placement for short term rehabilitation. Feels comfortable today. Admits of lower extremity swelling and pain in left calf, also wheezing today  Review of Systems  Constitutional: Negative for fever, chills and weight loss.  HENT: Negative for congestion.   Eyes: Negative for blurred vision and double vision.  Respiratory: Positive for shortness of breath and wheezing. Negative for cough and sputum production.   Cardiovascular: Negative for chest pain, palpitations, orthopnea, leg swelling and PND.  Gastrointestinal: Negative for nausea, vomiting, abdominal pain, diarrhea, constipation and blood in stool.  Genitourinary: Negative for dysuria, urgency, frequency and hematuria.  Musculoskeletal: Negative for falls.  Neurological: Negative for dizziness, tremors, focal weakness and headaches.  Endo/Heme/Allergies: Does not bruise/bleed easily.  Psychiatric/Behavioral: Negative for depression. The patient does not have insomnia.     VITAL SIGNS: Blood pressure 131/89, pulse 86, temperature 98.3 F (36.8 C), temperature source Oral, resp. rate  20, height 5\' 5"  (1.651 m), weight 72.031 kg (158 lb 12.8 oz), SpO2 98 %.  PHYSICAL EXAMINATION:   GENERAL:  81 y.o.-year-old patient lying in the bed with no acute distress. Pale and has upper eyelid swelling EYES: Pupils equal, round, reactive to light and accommodation. No scleral icterus. Extraocular muscles intact.  HEENT: Head atraumatic, normocephalic. Oropharynx and nasopharynx clear.  NECK:  Supple, no jugular venous distention. No thyroid enlargement, no tenderness.  LUNGS: Somewhat diminished breath sounds bilaterally at bases, significant expiratory as well as inspiratory wheezing, especially in left lower lobe,  also some rales,rhonchi , but no crepitation. No use of accessory muscles of respiration. Able to lay flat on the back with no significant discomfort CARDIOVASCULAR: S1, S2 , irregularly irregular. No murmurs, rubs, or gallops.  ABDOMEN: Soft, nontender, nondistended. Bowel sounds present. No organomegaly or mass.  EXTREMITIES: 1+ bilateral pretibial and pedal edema, no cyanosis, or clubbing.  NEUROLOGIC: Cranial nerves II through XII are intact. Muscle strength 5/5 in all extremities. Sensation intact. Gait not checked.  PSYCHIATRIC: The patient is alert and oriented x 3.  SKIN: No obvious rash, lesion, or ulcer.   ORDERS/RESULTS REVIEWED:   CBC  Recent Labs Lab 01/26/15 1042 01/27/15 0553  WBC 8.5 11.6*  HGB 9.7* 10.5*  HCT 29.7* 32.5*  PLT 135* 138*  MCV 87.8 87.5  MCH 28.7 28.2  MCHC 32.7 32.2  RDW 12.7 12.9  LYMPHSABS 1.0  --   MONOABS 0.6  --   EOSABS 0.2  --   BASOSABS 0.1  --    ------------------------------------------------------------------------------------------------------------------  Chemistries   Recent Labs Lab 01/26/15 1042 01/27/15 0553  NA 142 142  K 4.1 4.2  CL 103 103  CO2 32 30  GLUCOSE 82 84  BUN 37* 35*  CREATININE 1.60* 1.54*  CALCIUM 9.3 8.6*  AST 53*  --   ALT 26  --   ALKPHOS 46  --   BILITOT 0.8  --     ------------------------------------------------------------------------------------------------------------------ estimated creatinine clearance is 29.5 mL/min (by C-G formula based on Cr of 1.54). ------------------------------------------------------------------------------------------------------------------ No results for input(s): TSH, T4TOTAL, T3FREE, THYROIDAB in the last 72 hours.  Invalid input(s): FREET3  Cardiac Enzymes  Recent Labs Lab 01/26/15 1042 01/26/15 1929 01/26/15 2244  TROPONINI 0.34* 0.40* 0.38*   ------------------------------------------------------------------------------------------------------------------ Invalid input(s): POCBNP ---------------------------------------------------------------------------------------------------------------  RADIOLOGY: Dg Chest 2 View  01/26/2015   CLINICAL DATA:  Dyspnea after fall.  EXAM: CHEST  2 VIEW  COMPARISON:  December 21, 2014.  FINDINGS: Stable cardiomegaly. No pneumothorax or pleural effusion is noted. No acute pulmonary disease is noted. Severe degenerative changes seen involving the right glenohumeral joint.  IMPRESSION: Stable cardiomegaly.  No acute pulmonary disease is noted.   Electronically Signed   By: Marijo Conception, M.D.   On: 01/26/2015 10:19   Dg Lumbar Spine 2-3 Views  01/26/2015   CLINICAL DATA:  [facility] nursing home patient. Fall while walking 2 bathroom with walker. Low back pain and sacral pain.  EXAM: LUMBAR SPINE - 2-3 VIEW  COMPARISON:  CT 11/14/2014  FINDINGS: Essentially normal alignment of the lumbar vertebral bodies. There is joint space widening at L3-L4 similar to comparison CT. There is joint space narrowing at L1-L2, L2-L3 and L4-L5. There is mild anterolisthesis of L3 on L4 by 5 mm which is also unchanged from comparison CT. On the frontal projection there is mild dextroscoliosis.  IMPRESSION: Multiple levels of degenerative change in the lumbar spine are similar to comparison CT of  11/14/2014. No acute findings.   Electronically Signed   By: Suzy Bouchard M.D.   On: 01/26/2015 10:21   Dg Sacrum/coccyx  01/26/2015   CLINICAL DATA:  Dyspnea, fall, low back pain  EXAM: SACRUM AND COCCYX - 2+ VIEW  COMPARISON:  None.  FINDINGS: There is generalized osteopenia. The sacrum is limited evaluation secondary to overlying bowel gas. No gross sacral fracture.  Right total hip arthroplasty. Moderate osteoarthritis of the left hip. Mild degenerative changes of bilateral sacroiliac joints. Degenerative disc disease at L4-5 and L5-S1 with bilateral facet arthropathy.  There is peripheral vascular atherosclerotic disease.  IMPRESSION: No acute osseous injury of the of the sacrum.   Electronically Signed   By: Kathreen Devoid   On: 01/26/2015 10:20   US Venous Img Lower Bilateral  01/27/2015   CLINICAL DATA:  Bilateral lower extremity swelling x1 month  EXAM: BILATERAL LOWER EXTREMITY VENOUS DOPPLER ULTRASOUND  TECHNIQUE: Gray-scale sonography with compression, as well as color and duplex ultrasound, were performed to evaluate the deep venous system from the level of the common femoral vein through the popliteal and proximal calf veins.  COMPARISON:  12/21/2014 and earlier studies  FINDINGS: Normal compressibility of the common femoral, superficial femoral, and popliteal veins, as well as the proximal calf veins. No filling defects to suggest DVT on grayscale or color Doppler imaging. Doppler waveforms show normal direction of venous flow, normal respiratory phasicity and response to augmentation. Scattered arterial calcifications are noted in both lower extremities. Visualized segments of the saphenous venous system normal in caliber and compressibility.  IMPRESSION: 1. No evidence of lower extremity deep vein thrombosis, BILATERALLY.   Electronically Signed   By: Lucrezia Europe  M.D.   On: 01/27/2015 14:51    EKG:  Orders placed or performed during the hospital encounter of 01/26/15  . ED EKG  . ED EKG   . EKG 12-Lead  . EKG 12-Lead    ASSESSMENT AND PLAN:  Active Problems:   CHF (congestive heart failure)   Pressure ulcer 1. Asthma exacerbation. Initiate patient on steroids, nebulizers, Zithromax, follow clinically. Continue oxygen therapy as needed. Get d-dimer and get VQ scan of chest if d-dimer is elevated to rule out pulmonary embolism. Doppler ultrasound of lower extremities is negative for DVT. No pneumonia on chest x-ray 2,. CHF, chronic. Echocardiogram is pending. Continue patient on her usual doses of torsemide 3. CK D stage III, seems to be stable with diuresis, follow along 4. Elevated troponin. Get cardiology consultation, continue aspirin therapy. Unable to use beta blockers due to active wheezing, echo is pending 5. Lower extremity swelling, no DVT per Doppler ultrasound, continue torsemide, following clinically 6. Generalized weakness, unclear etiology, get physical therapist involved. Patient will likely need to go to skilled nursing facility for rehabilitation 7. Urinary tract infection, getting urine cultures. Continue patient on Keflex orally   Management plans discussed with the patient, family and they are in agreement.   DRUG ALLERGIES:  Allergies  Allergen Reactions  . Ibuprofen Other (See Comments)    Pt states that she can't take because of her kidneys.    . Motrin Pm [Ibuprofen-Diphenhydramine Cit] Other (See Comments)    Pt states that she can't take because of her kidneys.    . Tylenol [Acetaminophen] Other (See Comments)    Pt states that she can't take because it is contraindicated with her Gleevec.      CODE STATUS:     Code Status Orders        Start     Ordered   01/26/15 1717  Full code   Continuous     01/26/15 1716      TOTAL TIME TAKING CARE OF THIS PATIENT: 45 minutes.    Theodoro Grist M.D on 01/27/2015 at 3:27 PM  Between 7am to 6pm - Pager - [phone]  After 6pm go to [email or web address] - password [email or web address]  Office  [phone]  CC: Primary care physician; Pcp Not In System

## 2015-01-27 NOTE — Clinical Documentation Improvement (Signed)
  Hospitalist  Can the diagnosis of acute renal failure be further specified?   Acute Renal Failure/Acute Kidney Injury  Acute Tubular Necrosis  Acute Renal Cortical Necrosis  Acute Renal Medullary Necrosis  Acute on Chronic Renal Failure  Chronic Renal Failure  Other  Clinically Undetermined  Document any associated diagnoses/conditions.   Supporting Information: BUN - 35; Creatinine - 1.54; GFR 31   Please exercise your independent, professional judgment when responding. A specific answer is not anticipated or expected.   Thank You,  [place] [phone]

## 2015-01-27 NOTE — Progress Notes (Signed)
Patient was made an initial appointment at the [facility] on February 13, 2015 at 1:00pm. Of note, she cancelled a previous appointment on 01/12/15.

## 2015-01-27 NOTE — Consult Note (Signed)
WOC wound consult note Reason for Consult: Chronic Stage III pressure injury to sacrum, present on admission. Left buttock:  2 Stage II pressure injury, present on admission.  Wound type: Pressure injury Pressure Ulcer POA: Yes Measurement: Sacrum 1.4 cm x 1 cm x 1 cm Left buttock 2 round lesions 1 cm in diameter Wound bed: sacrum:  100% pale pink Left buttocks 100% pink and moist Drainage (amount, consistency, odor) Minimal serosanguinous drainage.  No odor Periwound:Intact  Maceration sacral wound from 9 to 2 o'clock, present on admission.  Dressing procedure/placement/frequency:Cleanse sacral wound with NS and pat gently dry.  Gently fill wound bed with calcium alginate.  Cover with ABD pad and secure with tape.  Change daily.  Cleanse left buttock pressure injury with NS and pat gently dry.  Apply silicone border foam.  Change every 3 days and PRN soilage.  Will not follow at this time.  Please re-consult if needed.  Domenic Moras RN BSN [place] Pager [phone]

## 2015-01-27 NOTE — Clinical Social Work Note (Addendum)
Clinical Social Work Assessment  Patient Details  Name: Riniyah Speich MRN: [id number] Date of Birth: [birth year]  Date of referral:  01/27/15               Reason for consult:  Facility Placement                Permission sought to share information with:  Facility Sport and exercise psychologist, Family Supports Permission granted to share information::  Yes, Verbal Permission Granted  Name::     Resi [phone] or [phone] (c)   Agency::  [facility]  Relationship::     Contact Information:     Housing/Transportation Living arrangements for the past 2 months:  [place] of Information:  Patient, Adult Children, Medical Team Patient Interpreter Needed:  None Criminal Activity/Legal Involvement Pertinent to Current Situation/Hospitalization:  No - Comment as needed Significant Relationships:  Adult Children Lives with:  Self Do you feel safe going back to the place where you live?  No Need for family participation in patient care:  No (Coment)  Care giving concerns:  No concerns voiced at this time.   Social Worker assessment / plan:  CSW spoke to pt.  She was sitting up eating her breakfast Ox4.  Pt confirmed that she is from independent living [facility], and she normally uses a [employer] fast streamer for ambulation.  Pt confirmed that she lives alone in at [facility].  Her current facility preferences are [facility], [facility], or [facility]  Pt's daughter who was in the room also agrees with SNF DC to above facilities.  Pt does need to meet medical criteria for a 3 night in patient stay in order for her insurance to cover SNF placement.  If she does not meet criteria she will need to pay SNF privately.  CSW will f/u once offers have been received.  Employment status:  Retired, Disabled (Comment on whether or not currently receiving Disability) Insurance information:  Medicare, Other (Comment Required) Nurse, mental health) PT Recommendations:  [place] / Referral to community resources:  [place]  Patient/Family's Response to care:  Pt was in agreement with DC to SNF for STR  Patient/Family's Understanding of and Emotional Response to Diagnosis, Current Treatment, and Prognosis:  Pt verbalized her understanding and was in agreement with SNF placement.  Emotional Assessment Appearance:  Appears stated age Attitude/Demeanor/Rapport:   (pleasent) Affect (typically observed):  Appropriate Orientation:  Oriented to Self, Oriented to Place, Oriented to  Time, Oriented to Situation Alcohol / Substance use:  Never Used Psych involvement (Current and /or in the community):  No (Comment)  Discharge Needs  Concerns to be addressed:  Care Coordination Readmission within the last 30 days:  No Current discharge risk:  Physical Impairment Barriers to Discharge:  Insurance Authorization   Leonides Cave 01/27/2015, 2:43 PM

## 2015-01-27 NOTE — Care Management (Signed)
Patient presents from home.  Resides at [facility] .  She had nursing and physical therapy services provided by [facility].  She also had inhome continuous services through [facility].  She cancelled her heart failure appointment that was scheduled for 9/6.   It is anticipated that patient will discharge to skilled nursing facility.  Notified [facility] of admission

## 2015-01-27 NOTE — Progress Notes (Signed)
Initial Nutrition Assessment     INTERVENTION:   Meals and Snacks: Cater to patient preferences; if po intake remains inadequate on follow-up, recommend liberalizing diet Medical Food Supplement Therapy: recommend Ensure Enlive po BID, each supplement provides 350 kcal and 20 grams of protein   NUTRITION DIAGNOSIS:   Increased nutrient needs related to wound healing as evidenced by estimated needs.  GOAL:   Patient will meet greater than or equal to 90% of their needs  MONITOR:    (Energy Intake, Anthropometrics, Digestive System, Electrolyte/Renal Profile)  REASON FOR ASSESSMENT:   Diagnosis    ASSESSMENT:    Pt admitted with acute CHF, acute cystitis; pt not in room on visit today  Past Medical History  Diagnosis Date  . Asthma   . Anxiety   . Hypertension   . Hyperlipidemia   . CVA (cerebral infarction)   . Paroxysmal a-fib   . Renal insufficiency   . COPD (chronic obstructive pulmonary disease)   . Mitral valve insufficiency   . Cancer     lung  . CHF (congestive heart failure)   . Stroke   . Left foot drop   . Nontraumatic partial bilateral rotator cuff tear    Diet Order:  Diet Heart Room service appropriate?: Yes; Fluid consistency:: Thin   Energy Intake: recorded po intake bites this AM  Skin:   (stage II pressure ulcer on buttock, stage III on sacrum)  Electrolyte and Renal Profile:  Recent Labs Lab 01/26/15 1042 01/27/15 0553  BUN 37* 35*  CREATININE 1.60* 1.54*  NA 142 142  K 4.1 4.2   Meds: prednisone   Nutrition Focused Physical Exam:  Unable to complete Nutrition-Focused physical exam at this time. Pt not in room   Height:   Ht Readings from Last 1 Encounters:  01/26/15 5\' 5"  (1.651 m)    Weight:   Wt Readings from Last 1 Encounters:  01/26/15 158 lb 12.8 oz (72.031 kg)    Filed Weights   01/26/15 0940 01/26/15 1923  Weight: 128 lb (58.06 kg) 158 lb 12.8 oz (72.031 kg)   Wt Readings from Last 10 Encounters:   01/26/15 158 lb 12.8 oz (72.031 kg)  12/21/14 132 lb (59.875 kg)  12/15/14 131 lb (59.421 kg)  11/14/14 162 lb (73.483 kg)  09/26/14 131 lb 8 oz (59.648 kg)  06/06/14 157 lb (71.215 kg)    BMI:  Body mass index is 26.43 kg/(m^2).  Estimated Nutritional Needs:   Kcal:  1710-2021 kcals (BEE 1196, 1.3 AF, 1.1-1.3 IF)   Protein:  108-144 g (1.5-2.0 g/kg)   Fluid:  1800-2160 mL (25-30 ml/kg)   MODERATE Care Level  Kerman Passey MS, RD, LDN [phone] Pager

## 2015-01-27 NOTE — Progress Notes (Signed)
Echocardiogram 2D Echocardiogram has been performed.  [name] 01/27/2015, 1:00 PM

## 2015-01-27 NOTE — Evaluation (Signed)
Physical Therapy Evaluation Patient Details Name: [name] MRN: [id number] DOB: 05/05/1934 Today's Date: 01/27/2015   History of Present Illness  81 y.o. female with a known history of CHF, COPD, asthma, liver cancer, chronic kidney disease, atrial fibrillation. She was recently started on baclofen for pain in her legs and then she couldn't walk. She's been very weak and today she actually fell backwards onto her bed. 81 y.o. female with a known history of CHF, COPD, asthma, liver cancer, chronic kidney disease, atrial fibrillation. She was recently started on baclofen for pain in her legs and then she couldn't walk. She's been very weak and today she actually fell backwards onto her bed. Pt has also been experiencing recent SOB.   Clinical Impression  Upon evaluation, pt states that she is in some pain and discomfort in her lower back/buttock region due to her recent fall and sacral pressure ulcer. Pt states that she is independent with mobility under normal circumstances and is able to get to dining hall from her room at her independent living facility with the help of her RW. Today, pt required mod assist for performance of supine to sitting on EOB bed mobility task; which appears to be baseline status for her. Pt required mod assist + 2 for completion of sit<>stand transfer, and stand pivot transfer; with RW. Pt presents with significant LLE impairments due to foot drop, pt unable to attain neutral ankle position actively. Pt also has limited L knee flexion ROM due to old TKA. Pt would benefit from further skilled PT services at a SNF in order to increase her strength and functional mobility and in order to provide close medical attention. Pt poses a high fall risk if d/c home as she did not demonstrate safety with transfers or with use of RW.      Follow Up Recommendations SNF    Equipment Recommendations  Rolling walker with 5" wheels    Recommendations for Other Services        Precautions / Restrictions Precautions Precautions: Fall Restrictions Weight Bearing Restrictions: No Other Position/Activity Restrictions: Pt currently has bed sore on her sacrum. Pt should be frequently rotated in bed for pressure relief to avoid furhter complications.       Mobility  Bed Mobility Overal bed mobility: Needs Assistance Bed Mobility: Supine to Sit     Supine to sit: Mod assist     General bed mobility comments: pt needs help with LLE coming off the side of bed and help scooting her hips toward the edge of bed once in sitting.   Transfers Overall transfer level: Needs assistance Equipment used: Rolling walker (2 wheeled) Transfers: Sit to/from [facility] Sit to Stand: +2 physical assistance;Mod assist Stand pivot transfers: Mod assist;+2 physical assistance       General transfer comment: Pt mod assist to get into standing; with PT assistance given at hips to encourage hip extension.   Ambulation/Gait Gait assessment not performed due to safety concerns and pt fatigue with basic stand pivot transfer.                 Stairs            Wheelchair Mobility    Modified Rankin (Stroke Patients Only)       Balance Overall balance assessment: Needs assistance Sitting-balance support: No upper extremity supported;Feet supported Sitting balance-Leahy Scale: Fair     Standing balance support: Bilateral upper extremity supported Standing balance-Leahy Scale: Poor Standing balance comment: Pt unsteady when  standing and required mod assist +2 for maintanence of standing posture and BUE on RW.                              Pertinent Vitals/Pain Pain Assessment:  (pt states that she has pain in her Lower back and buttock but did not rate)    Home Living Family/patient expects to be discharged to:: Private residence [facility]) Living Arrangements: Alone Available Help at Discharge: Home  health (pt has home health aid that helps her for 2 hours every morning with bathing and dressing, and another home helath aid that comes in the PM for 30 mins to help her get ready for bed) Type of Home: Apartment Home Access: Level entry     Home Layout: One level Home Equipment: Walker - 4 wheels;Shower seat;Bedside commode;Wheelchair - manual      Prior Function Level of Independence: Independent with assistive device(s);Needs assistance   Gait / Transfers Assistance Needed: needs assist to stand from bed but otherwise uses lift chair to help with standing as well as rollator (sits on rollator in dining room d/t easier to stand from); has assist 7 days a week to get ready and into bed and assist every morning to get out of bed.  ADL's / Homemaking Assistance Needed: Has assist 7 days per week (2 hours in morning M-Saturday and 1 hour on Sunday for dressing, breakfast, bathing, etc and assist 7 days/week at night to get ready for bed).  Comments: Uses rollator and has home O2 at night (2-3 L/min via nasal cannula); pt's daughter comes to assist every Sunday; uses L AFO d/t foot drop baseline     Hand Dominance        Extremity/Trunk Assessment   Upper Extremity Assessment: RUE deficits/detail;LUE deficits/detail (bilateral rotator cuff involvement with limited ROM and strength in UEs)           Lower Extremity Assessment: LLE deficits/detail (LLE foot drop; unable to DF to neutral. LLE ROM deficits at knee due to previous TKA. )         Communication   Communication: No difficulties  Cognition Arousal/Alertness: Awake/alert Behavior During Therapy: WFL for tasks assessed/performed Overall Cognitive Status: Within Functional Limits for tasks assessed                      General Comments      Exercises Total Joint Exercises Ankle Circles/Pumps: AROM;5 reps;Both;Supine (pt has limited AROM of L ankle, cannot actively achieve neutral ankle position ) Heel  Slides: AROM;Both;5 reps;Supine (pt limited with knee flexion ROM bilat due to weakness and old TKA on L)      Assessment/Plan    PT Assessment Patient needs continued PT services  PT Diagnosis Difficulty walking;Abnormality of gait;Generalized weakness   PT Problem List Decreased strength;Decreased range of motion;Decreased activity tolerance;Decreased balance;Decreased mobility;Pain  PT Treatment Interventions DME instruction;Gait training;Functional mobility training;Therapeutic activities;Therapeutic exercise;Balance training   PT Goals (Current goals can be found in the Care Plan section) Acute Rehab PT Goals Patient Stated Goal: pt states that she would like to return home PT Goal Formulation: With patient Time For Goal Achievement: 02/10/15 Potential to Achieve Goals: Fair    Frequency Min 2X/week   Barriers to discharge        Co-evaluation               End of Session Equipment Utilized  During Treatment: Gait belt Activity Tolerance: Patient tolerated treatment well Patient left: in chair;with call bell/phone within reach (nurse notified that pt was left in chair and that PT forgot to set chair alarm. Pt had pillow under her for extra cushion and pressure relief on her sacral sore. )           Time: 7672-0947 PT Time Calculation (min) (ACUTE ONLY): 33 min   Charges:         PT G Codes:        Daniel Stirparo,SPT 01/27/2015, 2:10 PM

## 2015-01-28 DIAGNOSIS — I214 Non-ST elevation (NSTEMI) myocardial infarction: Secondary | ICD-10-CM

## 2015-01-28 DIAGNOSIS — I5023 Acute on chronic systolic (congestive) heart failure: Principal | ICD-10-CM

## 2015-01-28 DIAGNOSIS — I480 Paroxysmal atrial fibrillation: Secondary | ICD-10-CM

## 2015-01-28 LAB — BASIC METABOLIC PANEL
ANION GAP: 8 (ref 5–15)
BUN: 40 mg/dL — ABNORMAL HIGH (ref 6–20)
CALCIUM: 8.3 mg/dL — AB (ref 8.9–10.3)
CO2: 31 mmol/L (ref 22–32)
Chloride: 101 mmol/L (ref 101–111)
Creatinine, Ser: 1.61 mg/dL — ABNORMAL HIGH (ref 0.44–1.00)
GFR, EST AFRICAN AMERICAN: 34 mL/min — AB (ref >60)
GFR, EST NON AFRICAN AMERICAN: 29 mL/min — AB (ref >60)
Glucose, Bld: 120 mg/dL — ABNORMAL HIGH (ref 65–99)
POTASSIUM: 3.3 mmol/L — AB (ref 3.5–5.1)
Sodium: 140 mmol/L (ref 135–145)

## 2015-01-28 LAB — MAGNESIUM: Magnesium: 1.7 mg/dL (ref 1.7–2.4)

## 2015-01-28 LAB — POTASSIUM: Potassium: 3.6 mmol/L (ref 3.5–5.1)

## 2015-01-28 MED ORDER — POTASSIUM CHLORIDE CRYS ER 20 MEQ PO TBCR
40.0000 meq | EXTENDED_RELEASE_TABLET | Freq: Once | ORAL | Status: AC
  Administered 2015-01-28: 40 meq via ORAL
  Filled 2015-01-28: qty 2
  Filled 2015-01-28: qty 1

## 2015-01-28 MED ORDER — ONDANSETRON HCL 4 MG PO TABS
4.0000 mg | ORAL_TABLET | Freq: Four times a day (QID) | ORAL | Status: DC | PRN
  Administered 2015-01-28: 4 mg via ORAL
  Filled 2015-01-28: qty 1

## 2015-01-28 MED ORDER — IPRATROPIUM-ALBUTEROL 0.5-2.5 (3) MG/3ML IN SOLN
3.0000 mL | RESPIRATORY_TRACT | Status: DC | PRN
Start: 2015-01-28 — End: 2015-01-30

## 2015-01-28 MED ORDER — SPIRONOLACTONE 25 MG PO TABS
12.5000 mg | ORAL_TABLET | Freq: Every day | ORAL | Status: DC
  Administered 2015-01-29 – 2015-01-30 (×2): 12.5 mg via ORAL
  Filled 2015-01-28 (×3): qty 1

## 2015-01-28 NOTE — Clinical Social Work Note (Signed)
CSW spoke to [name] and her daughter in the room.  Only current offer for SNF is [facility].  Family stated that if there are no other offers then they will take [name] home.  They want [facility], [facility] or [facility].  No response from other facilities.  Will f/u on Monday  As they do not admit over the weekend

## 2015-01-28 NOTE — Progress Notes (Signed)
Alert and oriented. Complained of pain once, given tramadol with some relief. Patient sat in the chair for a few hours today, two person assist to stand. Has been incontinent all day. Dressings kept clean and dry on bottom. First degree heart block on tele. Patient complaining of some nausea at the end of the shift, stating she thinks the antibiotic is making her stomach upset. Zofran given per new order.

## 2015-01-28 NOTE — Consult Note (Signed)
Patient ID: [name] MRN: [id number] DOB/AGE: 1933-12-17 81 y.o.  Admit date: 01/26/2015 Primary Physician Drs. Making Housecalls, Dr. Perrin Smack Primary Cardiologist Lujean Amel, MD   Chief Complaint  fall  HPI: [name] is a 81F with paroxysmal atrial fibrillation, hypertension, CKD, chronic systolic heart failure, mod-severe MR and TR, and sacral decubitus ulcers who presented with a fall and was found to be in acute on chronic systolic heart failure.  [name] was prescribed baclofen and tramadol for her back pain approximately 10 days ago, which made her profoundly week.  She had a difficult time ambulating to the bathroom, so she stopped taking her diuretics in order to avoid going to the bathroom.  Over the 3-4 days prior to presentation she noted increased LE edema, wheezing and shortness of breath.  She had a mechanical fall at home and was brought to the ED and noted to be volume overloaded.  She received IV lasix in the ED with improvement in her breathing, but remained too weak to be discharged back to her assisted living facility.  She was admitted to the hospitalist service and cardiology was consulted for heart failure management.  Her echo demonstrated LVEF 25-30% with moderate MR and TR.  Her PASP was 52 mmHg.   [name] had an echo at [facility] on 12/05/14 that showed LVEF 40% with moderate LVH and diffuse hypocontractility.  She was noted to have severe TR and MR with mild MR and PR.  She was hospitalized on 8/17 with acute on chronic systolic heart failure and acute on chronic renal failure.  She had been managed with torsemide and metolazone, but developed worsening renal failure with diuresis.  She had a V/Q scan that was negative for PE and LE dopplers were negative for DVT.  Her daughter reports that she had a heart catheterization with Dr. Clayborn Bigness 1.5 years ago that was reportedly negative for obstructive coronary disease.  Review of Systems:       Past Medical History  Diagnosis Date  . Asthma   . Anxiety   . Hypertension   . Hyperlipidemia   . CVA (cerebral infarction)   . Paroxysmal a-fib   . Renal insufficiency   . COPD (chronic obstructive pulmonary disease)   . Mitral valve insufficiency   . Cancer     lung  . CHF (congestive heart failure)   . Stroke   . Left foot drop   . Nontraumatic partial bilateral rotator cuff tear     Medications Prior to Admission  Medication Sig Dispense Refill  . aspirin EC 325 MG tablet Take 325 mg by mouth daily.    . baclofen (LIORESAL) 10 MG tablet Take 1 tablet by mouth 2 (two) times daily as needed.  0  . brimonidine (ALPHAGAN) 0.15 % ophthalmic solution Place 1 drop into both eyes 2 (two) times daily.    . brinzolamide (AZOPT) 1 % ophthalmic suspension Place 1 drop into both eyes 2 (two) times daily.    . budesonide-formoterol (SYMBICORT) 160-4.5 MCG/ACT inhaler Inhale 2 puffs into the lungs 2 (two) times daily.    . diazepam (VALIUM) 5 MG tablet Take one tablet by mouth every 12 hours as needed for anxiety/nervousness. (Patient taking differently: Take 5 mg by mouth every 12 (twelve) hours as needed for anxiety. ) 60 tablet 5  . digoxin (LANOXIN) 0.125 MG tablet Take 0.125 mg by mouth daily.     . diphenoxylate-atropine (LOMOTIL) 2.5-0.025 MG per tablet Take two  tablets by mouth four times daily as needed for diarrhea (Patient taking differently: Take 2 tablets by mouth 4 (four) times daily as needed for diarrhea or loose stools. ) 60 tablet 0  . FERREX 150 150 MG capsule TAKE 2 CAPSULES BY MOUTH EVERY DAY 120 capsule 3  . imatinib (GLEEVEC) 100 MG tablet Take 400 mg by mouth daily.    Marland Kitchen latanoprost (XALATAN) 0.005 % ophthalmic solution Place 1 drop into both eyes at bedtime.    . levalbuterol (XOPENEX HFA) 45 MCG/ACT inhaler Inhale 2 puffs into the lungs every 4 (four) hours as needed for wheezing.    . levalbuterol (XOPENEX) 1.25 MG/3ML nebulizer solution Take 1.25 mg by  nebulization every 6 (six) hours as needed for wheezing.    . metolazone (ZAROXOLYN) 5 MG tablet Take 5 mg by mouth 2 (two) times a week. Pt takes on Tuesday and Friday.    . montelukast (SINGULAIR) 10 MG tablet Take 10 mg by mouth at bedtime.    . predniSONE (DELTASONE) 10 MG tablet Take 10 mg by mouth every other day.    . theophylline (THEODUR) 200 MG 12 hr tablet Take 200 mg by mouth 2 (two) times daily with a meal.     . torsemide (DEMADEX) 20 MG tablet Take 40 mg by mouth 2 (two) times a week. Pt takes on Tuesday and Friday.    . traMADol (ULTRAM) 50 MG tablet Take two tablets by mouth three times daily as needed for pain (Patient taking differently: Take 50 mg by mouth every 8 (eight) hours as needed for moderate pain. ) 180 tablet 5  . Vitamin D, Ergocalciferol, (DRISDOL) 50000 UNITS CAPS capsule Take 50,000 Units by mouth every 7 (seven) days.       Marland Kitchen aspirin EC  325 mg Oral Daily  . azithromycin  250 mg Oral Daily  . brimonidine  1 drop Both Eyes BID  . brinzolamide  1 drop Both Eyes BID  . budesonide-formoterol  2 puff Inhalation BID  . cephALEXin  250 mg Oral 3 times per day  . digoxin  0.125 mg Oral Daily  . enoxaparin (LOVENOX) injection  30 mg Subcutaneous Q24H  . feeding supplement (ENSURE ENLIVE)  237 mL Oral BID BM  . imatinib  400 mg Oral Daily  . iron polysaccharides  300 mg Oral Daily  . latanoprost  1 drop Both Eyes QHS  . metolazone  5 mg Oral Once per day on Mon Thu  . montelukast  10 mg Oral QHS  . predniSONE  10 mg Oral QODAY  . predniSONE  50 mg Oral Q breakfast  . theophylline  200 mg Oral BID WC  . torsemide  40 mg Oral BID  . Vitamin D (Ergocalciferol)  50,000 Units Oral Q7 days    Infusions:    Allergies  Allergen Reactions  . Ibuprofen Other (See Comments)    Pt states that she can't take because of her kidneys.    . Motrin Pm [Ibuprofen-Diphenhydramine Cit] Other (See Comments)    Pt states that she can't take because of her kidneys.    .  Tylenol [Acetaminophen] Other (See Comments)    Pt states that she can't take because it is contraindicated with her Gleevec.      Social History   Social History  . Marital Status: Widowed    Spouse Name: N/A  . Number of Children: N/A  . Years of Education: N/A   Occupational History  . Not on  file.   Social History Main Topics  . Smoking status: Never Smoker   . Smokeless tobacco: Never Used  . Alcohol Use: No  . Drug Use: No  . Sexual Activity: Not on file   Other Topics Concern  . Not on file   Social History Narrative    Family History  Problem Relation Age of Onset  . Congestive Heart Failure Mother   . Lung cancer Father   . Ovarian cancer Sister   . Throat cancer Brother     PHYSICAL EXAM: Filed Vitals:   01/28/15 1210  BP: 111/63  Pulse: 53  Temp: 97.7 F (36.5 C)  Resp:      Intake/Output Summary (Last 24 hours) at 01/28/15 1313 Last data filed at 01/27/15 1939  Gross per 24 hour  Intake    390 ml  Output      0 ml  Net    390 ml    General:  Chronically ill-appearing. No respiratory difficulty HEENT: normal Neck: supple.  JVP 2 cm above the clavicle at 45 degrees.. Carotids 2+ bilat; no bruits. No lymphadenopathy or thryomegaly appreciated. Cor: PMI nondisplaced. Regular rate & rhythm. No rubs or gallops.  III/VI holosystolic murmur at the apex. Lungs: clear Abdomen: soft, nontender, nondistended. No hepatosplenomegaly. No bruits or masses. Good bowel sounds. Extremities: no cyanosis, clubbing, rash, edema Neuro: alert & oriented x 3, cranial nerves grossly intact. moves all 4 extremities w/o difficulty. Affect pleasant.  Results for orders placed or performed during the hospital encounter of 01/26/15 (from the past 24 hour(s))  Basic metabolic panel     Status: Abnormal   Collection Time: 01/28/15  4:17 AM  Result Value Ref Range   Sodium 140 135 - 145 mmol/L   Potassium 3.3 (L) 3.5 - 5.1 mmol/L   Chloride 101 101 - 111 mmol/L   CO2  31 22 - 32 mmol/L   Glucose, Bld 120 (H) 65 - 99 mg/dL   BUN 40 (H) 6 - 20 mg/dL   Creatinine, Ser 1.61 (H) 0.44 - 1.00 mg/dL   Calcium 8.3 (L) 8.9 - 10.3 mg/dL   GFR calc non Af Amer 29 (L) >60 mL/min   GFR calc Af Amer 34 (L) >60 mL/min   Anion gap 8 5 - 15   US Venous Img Lower Bilateral  01/27/2015   CLINICAL DATA:  Bilateral lower extremity swelling x1 month  EXAM: BILATERAL LOWER EXTREMITY VENOUS DOPPLER ULTRASOUND  TECHNIQUE: Gray-scale sonography with compression, as well as color and duplex ultrasound, were performed to evaluate the deep venous system from the level of the common femoral vein through the popliteal and proximal calf veins.  COMPARISON:  12/21/2014 and earlier studies  FINDINGS: Normal compressibility of the common femoral, superficial femoral, and popliteal veins, as well as the proximal calf veins. No filling defects to suggest DVT on grayscale or color Doppler imaging. Doppler waveforms show normal direction of venous flow, normal respiratory phasicity and response to augmentation. Scattered arterial calcifications are noted in both lower extremities. Visualized segments of the saphenous venous system normal in caliber and compressibility.  IMPRESSION: 1. No evidence of lower extremity deep vein thrombosis, BILATERALLY.   Electronically Signed   By: Lucrezia Europe M.D.   On: 01/27/2015 14:51   Echo 01/27/15: Study Conclusions  - Left ventricle: The cavity size was normal. There was moderate concentric hypertrophy. Systolic function was severely reduced. The estimated ejection fraction was in the range of 25% to 30%. Diffuse hypokinesis. The  study is not technically sufficient to allow evaluation of LV diastolic function. - Mitral valve: There was moderate regurgitation. - Left atrium: The atrium was severely dilated. - Right atrium: The atrium was moderately dilated. - Tricuspid valve: There was moderate regurgitation. - Pulmonary arteries: Systolic pressure was  moderately increased. PA peak pressure: 52 mm Hg (S).  ECG: Atrial fibrillation with rate 81 bpm.  Frequent PVCs vs aberrantly conducted atrial fibrillation.  ASSESSMENT/PLAN:  # Acute on chronic systolic heart failure/ Moderate-severe MR/TR:: Non-ischemic systolic heart failure.  [name] presented with decompensated heart failure in the setting of missing several days of diuretics.  Her LVEF is lower than reported last month. She has known moderate-severe valvular disease that appeared slightly improved this admission when compared with her echo report from 12/05/14.  If we consider her valvular disease to be severe, technically she has a Class I indication for mitral valve replacement/tricuspid valve repair.  However, she seems to be a poor candidate for such a large operation given her debility (sacral decubs, falls, advanced age).  Her STS risk of mortality for mitral valve replacement alone is 7.8%.  This does not include the risk for concomminent tricuspid valve repair.  Should she recover from this and follow up for evaluation in heart failure clinic, she may benefit from consideration of RHC with possible inotropes or Mitraclip.  Compliance may be a limitation to these options.  Given that her LVEF is <35% and she does not have BP room for beta blocker or ACE-I therapy, will need to consider Life vest and possible ICD. - BP will not tolerate ACE-I or beta blocker at this time. - Replete K>4, Mg >2 - Agree with switching to her home po regimen of diuresis - Will need close follow up in HF clinic - will start spironolactone 12.5mg  - consider outpatient stress - [name] was scheduled to be seen in the heart failure clinic 01/12/15.  However she cancelled this appointment and has a follow up scheduled on 02/19/2015.  # NSTEMI: Cardiac enzymes were elevated, likely in the setting of demand ischemia from her heart failure exacerbation.  She denies chest pain and there are no ischemic changes on  ECG.  She has frequent PVCs vs aberrantly conducted atrial fibrillation both on ECG and on telemetry.   However, on review of her prior ECGs, this is unchanged from prior.  I do not think this represents acute ischemia. - Continue aspirin - Will try to start low-dose beta blocker if BP will allow - Consider an ischemia evaluation as an outpatient  # Paroxysmal atrial fibrillation: Rate well-controlled. - Continue digoxin - Continue aspirin 325mg  daily.  Ideally she would be on anticoagulation given her CHADS2-VASc of 6.  Will defer to Dr. Clayborn Bigness. This patients CHA2DS2-VASc Score and unadjusted Ischemic Stroke Rate (% per year) is equal to 9.7 % stroke rate/year from a score of 6  Above score calculated as 1 point each if present [CHF, HTN, DM, Vascular=MI/PAD/Aortic Plaque, Age if 65-74, or Female] Above score calculated as 2 points each if present [Age > 75, or Stroke/TIA/TE]   Signed: Sharol Harness, MD 01/28/2015, 1:13 PM

## 2015-01-28 NOTE — Progress Notes (Signed)
[facility] [place] NAME: [name]    MR#:  [id number]  DATE OF BIRTH:  1933-12-15  SUBJECTIVE:  CHIEF COMPLAINT:   Chief Complaint  Patient presents with  . Fall   C/o pain in sacral region, her breathing is improved denies any chest pain    Review of Systems  Constitutional: Negative for fever, chills and weight loss.  HENT: Negative for congestion.   Eyes: Negative for blurred vision and double vision.  Respiratory: Positive for shortness of breath improved . Negative for cough and sputum production.   Cardiovascular: Negative for chest pain, palpitations, orthopnea, leg swelling and PND.  Gastrointestinal: Negative for nausea, vomiting, abdominal pain, diarrhea, constipation and blood in stool.  Genitourinary: Negative for dysuria, urgency, frequency and hematuria.  Musculoskeletal: Negative for falls.  Neurological: Negative for dizziness, tremors, focal weakness and headaches.  Endo/Heme/Allergies: Does not bruise/bleed easily.  Psychiatric/Behavioral: Negative for depression. The patient does not have insomnia.     VITAL SIGNS: Blood pressure 117/66, pulse 105, temperature 98.9 F (37.2 C), temperature source Oral, resp. rate 20, height 5\' 5"  (1.651 m), weight 72.031 kg (158 lb 12.8 oz), SpO2 100 %.  PHYSICAL EXAMINATION:   GENERAL:  81 y.o.-year-old patient lying in the bed with no acute distress. Pale and has upper eyelid swelling EYES: Pupils equal, round, reactive to light and accommodation. No scleral icterus. Extraocular muscles intact.  HEENT: Head atraumatic, normocephalic. Oropharynx and nasopharynx clear.  NECK:  Supple, no jugular venous distention. No thyroid enlargement, no tenderness.  LUNGS: Somewhat diminished breath sounds bilaterally at bases, occasional wheezing CARDIOVASCULAR: S1, S2 , irregularly irregular. No murmurs, rubs, or gallops.  ABDOMEN: Soft, nontender, nondistended. Bowel sounds  present. No organomegaly or mass.  EXTREMITIES: 1+ bilateral pretibial and pedal edema, no cyanosis, or clubbing.  NEUROLOGIC: Cranial nerves II through XII are intact. Muscle strength 5/5 in all extremities. Sensation intact. Gait not checked.  PSYCHIATRIC: The patient is alert and oriented x 3.  SKIN: No obvious rash, lesion, or ulcer.   ORDERS/RESULTS REVIEWED:   CBC  Recent Labs Lab 01/26/15 1042 01/27/15 0553  WBC 8.5 11.6*  HGB 9.7* 10.5*  HCT 29.7* 32.5*  PLT 135* 138*  MCV 87.8 87.5  MCH 28.7 28.2  MCHC 32.7 32.2  RDW 12.7 12.9  LYMPHSABS 1.0  --   MONOABS 0.6  --   EOSABS 0.2  --   BASOSABS 0.1  --    ------------------------------------------------------------------------------------------------------------------  Chemistries   Recent Labs Lab 01/26/15 1042 01/27/15 0553 01/28/15 0417  NA 142 142 140  K 4.1 4.2 3.3*  CL 103 103 101  CO2 32 30 31  GLUCOSE 82 84 120*  BUN 37* 35* 40*  CREATININE 1.60* 1.54* 1.61*  CALCIUM 9.3 8.6* 8.3*  AST 53*  --   --   ALT 26  --   --   ALKPHOS 46  --   --   BILITOT 0.8  --   --    ------------------------------------------------------------------------------------------------------------------ estimated creatinine clearance is 28.2 mL/min (by C-G formula based on Cr of 1.61). ------------------------------------------------------------------------------------------------------------------ No results for input(s): TSH, T4TOTAL, T3FREE, THYROIDAB in the last 72 hours.  Invalid input(s): FREET3  Cardiac Enzymes  Recent Labs Lab 01/26/15 1042 01/26/15 1929 01/26/15 2244  TROPONINI 0.34* 0.40* 0.38*   ------------------------------------------------------------------------------------------------------------------ Invalid input(s): POCBNP ---------------------------------------------------------------------------------------------------------------  RADIOLOGY: US Venous Img Lower Bilateral  01/27/2015    CLINICAL DATA:  Bilateral lower extremity swelling x1 month  EXAM:  BILATERAL LOWER EXTREMITY VENOUS DOPPLER ULTRASOUND  TECHNIQUE: Gray-scale sonography with compression, as well as color and duplex ultrasound, were performed to evaluate the deep venous system from the level of the common femoral vein through the popliteal and proximal calf veins.  COMPARISON:  12/21/2014 and earlier studies  FINDINGS: Normal compressibility of the common femoral, superficial femoral, and popliteal veins, as well as the proximal calf veins. No filling defects to suggest DVT on grayscale or color Doppler imaging. Doppler waveforms show normal direction of venous flow, normal respiratory phasicity and response to augmentation. Scattered arterial calcifications are noted in both lower extremities. Visualized segments of the saphenous venous system normal in caliber and compressibility.  IMPRESSION: 1. No evidence of lower extremity deep vein thrombosis, BILATERALLY.   Electronically Signed   By: Lucrezia Europe M.D.   On: 01/27/2015 14:51    EKG:  Orders placed or performed during the hospital encounter of 01/26/15  . ED EKG  . ED EKG  . EKG 12-Lead  . EKG 12-Lead    ASSESSMENT AND PLAN:  Active Problems:   CHF (congestive heart failure)   Pressure ulcer 1. Asthma exacerbation. Continue steroids, nebulizers, Zithromax, follow clinically. Continue oxygen therapy as needed.  2,. CHF, chronic systolic CHF. Echocardiogram significant systolic dysfunction as well as multi-valvular regurg as well as pulmonary hypertension 3. CK D stage III, seems to be stable close to baseline monitor for now 4. Elevated troponin. Cardiology consult 5. Lower extremity swelling, no DVT per Doppler ultrasound, continue torsemide, following clinically 6. Generalized weakness, needs rehabilitation and skilled nursing facility 7. Urinary tract infection, await urine cultures   Management plans discussed with the patient, family and they are in  agreement.   DRUG ALLERGIES:  Allergies  Allergen Reactions  . Ibuprofen Other (See Comments)    Pt states that she can't take because of her kidneys.    . Motrin Pm [Ibuprofen-Diphenhydramine Cit] Other (See Comments)    Pt states that she can't take because of her kidneys.    . Tylenol [Acetaminophen] Other (See Comments)    Pt states that she can't take because it is contraindicated with her Gleevec.      CODE STATUS:     Code Status Orders        Start     Ordered   01/26/15 1717  Full code   Continuous     01/26/15 1716      TOTAL TIME TAKING CARE OF THIS PATIENT: 35 minutes.    Dustin Flock M.D on 01/28/2015 at 12:06 PM  Between 7am to 6pm - Pager - [phone]  After 6pm go to [email or web address] - password [email or web address] [facility]  Office  [phone]  CC: Primary care physician; Pcp Not In System

## 2015-01-29 DIAGNOSIS — R778 Other specified abnormalities of plasma proteins: Secondary | ICD-10-CM | POA: Insufficient documentation

## 2015-01-29 DIAGNOSIS — I509 Heart failure, unspecified: Secondary | ICD-10-CM | POA: Insufficient documentation

## 2015-01-29 DIAGNOSIS — I340 Nonrheumatic mitral (valve) insufficiency: Secondary | ICD-10-CM | POA: Insufficient documentation

## 2015-01-29 DIAGNOSIS — R7989 Other specified abnormal findings of blood chemistry: Secondary | ICD-10-CM | POA: Insufficient documentation

## 2015-01-29 LAB — URINE CULTURE
Culture: >=100000
Special Requests: NORMAL

## 2015-01-29 MED ORDER — INFLUENZA VAC SPLIT QUAD 0.5 ML IM SUSY
0.5000 mL | PREFILLED_SYRINGE | INTRAMUSCULAR | Status: AC | PRN
  Administered 2015-01-30: 0.5 mL via INTRAMUSCULAR
  Filled 2015-01-29: qty 0.5

## 2015-01-29 MED ORDER — OXYCODONE HCL 5 MG PO TABS
5.0000 mg | ORAL_TABLET | Freq: Four times a day (QID) | ORAL | Status: DC | PRN
  Administered 2015-01-29 – 2015-01-30 (×3): 5 mg via ORAL
  Filled 2015-01-29 (×3): qty 1

## 2015-01-29 MED ORDER — POTASSIUM CHLORIDE CRYS ER 20 MEQ PO TBCR
40.0000 meq | EXTENDED_RELEASE_TABLET | Freq: Once | ORAL | Status: AC
  Administered 2015-01-29: 40 meq via ORAL
  Filled 2015-01-29: qty 2

## 2015-01-29 MED ORDER — MAGNESIUM OXIDE 400 (241.3 MG) MG PO TABS
800.0000 mg | ORAL_TABLET | Freq: Once | ORAL | Status: AC
  Administered 2015-01-29: 800 mg via ORAL
  Filled 2015-01-29: qty 2

## 2015-01-29 MED ORDER — HYDROMORPHONE HCL 1 MG/ML IJ SOLN
1.0000 mg | INTRAMUSCULAR | Status: DC | PRN
  Administered 2015-01-29 – 2015-01-30 (×2): 1 mg via SUBCUTANEOUS
  Filled 2015-01-29 (×2): qty 1

## 2015-01-29 NOTE — Progress Notes (Signed)
[facility] NAME: [name]    MR#:  [id number]  DATE OF BIRTH:  May 02, 1934  SUBJECTIVE:  CHIEF COMPLAINT:   Chief Complaint  Patient presents with  . Fall    Continues to complain of shortness of breath and cough denies any chest pain    Review of Systems  Constitutional: Negative for fever, chills and weight loss.  HENT: Negative for congestion.   Eyes: Negative for blurred vision and double vision.  Respiratory: Positive for shortness of breath improved . Negative for cough and sputum production.   Cardiovascular: Negative for chest pain, palpitations, orthopnea, leg swelling and PND.  Gastrointestinal: Negative for nausea, vomiting, abdominal pain, diarrhea, constipation and blood in stool.  Genitourinary: Negative for dysuria, urgency, frequency and hematuria.  Musculoskeletal: Negative for falls.  Neurological: Negative for dizziness, tremors, focal weakness and headaches.  Endo/Heme/Allergies: Does not bruise/bleed easily.  Psychiatric/Behavioral: Negative for depression. The patient does not have insomnia.     VITAL SIGNS: Blood pressure 117/73, pulse 106, temperature 98.4 F (36.9 C), temperature source Oral, resp. rate 20, height 5\' 5"  (1.651 m), weight 72.031 kg (158 lb 12.8 oz), SpO2 97 %.  PHYSICAL EXAMINATION:   GENERAL:  81 y.o.-year-old patient lying in the bed with no acute distress. Pale and has upper eyelid swelling EYES: Pupils equal, round, reactive to light and accommodation. No scleral icterus. Extraocular muscles intact.  HEENT: Head atraumatic, normocephalic. Oropharynx and nasopharynx clear.  NECK:  Supple, no jugular venous distention. No thyroid enlargement, no tenderness.  LUNGS: Somewhat diminished breath sounds bilaterally at bases, occasional wheezing CARDIOVASCULAR: S1, S2 , irregularly irregular. No murmurs, rubs, or gallops.  ABDOMEN: Soft, nontender, nondistended. Bowel sounds  present. No organomegaly or mass.  EXTREMITIES: 1+ bilateral pretibial and pedal edema, no cyanosis, or clubbing.  NEUROLOGIC: Cranial nerves II through XII are intact. Muscle strength 5/5 in all extremities. Sensation intact. Gait not checked.  PSYCHIATRIC: The patient is alert and oriented x 3.  SKIN: No obvious rash, lesion, or ulcer.   ORDERS/RESULTS REVIEWED:   CBC  Recent Labs Lab 01/26/15 1042 01/27/15 0553  WBC 8.5 11.6*  HGB 9.7* 10.5*  HCT 29.7* 32.5*  PLT 135* 138*  MCV 87.8 87.5  MCH 28.7 28.2  MCHC 32.7 32.2  RDW 12.7 12.9  LYMPHSABS 1.0  --   MONOABS 0.6  --   EOSABS 0.2  --   BASOSABS 0.1  --    ------------------------------------------------------------------------------------------------------------------  Chemistries   Recent Labs Lab 01/26/15 1042 01/27/15 0553 01/28/15 0417 01/28/15 1525  NA 142 142 140  --   K 4.1 4.2 3.3* 3.6  CL 103 103 101  --   CO2 32 30 31  --   GLUCOSE 82 84 120*  --   BUN 37* 35* 40*  --   CREATININE 1.60* 1.54* 1.61*  --   CALCIUM 9.3 8.6* 8.3*  --   MG  --   --   --  1.7  AST 53*  --   --   --   ALT 26  --   --   --   ALKPHOS 46  --   --   --   BILITOT 0.8  --   --   --    ------------------------------------------------------------------------------------------------------------------ estimated creatinine clearance is 28.2 mL/min (by C-G formula based on Cr of 1.61). ------------------------------------------------------------------------------------------------------------------ No results for input(s): TSH, T4TOTAL, T3FREE, THYROIDAB in the last 72 hours.  Invalid input(s):  FREET3  Cardiac Enzymes  Recent Labs Lab 01/26/15 1042 01/26/15 1929 01/26/15 2244  TROPONINI 0.34* 0.40* 0.38*   ------------------------------------------------------------------------------------------------------------------ Invalid input(s):  POCBNP ---------------------------------------------------------------------------------------------------------------  RADIOLOGY: US Venous Img Lower Bilateral  01/27/2015   CLINICAL DATA:  Bilateral lower extremity swelling x1 month  EXAM: BILATERAL LOWER EXTREMITY VENOUS DOPPLER ULTRASOUND  TECHNIQUE: Gray-scale sonography with compression, as well as color and duplex ultrasound, were performed to evaluate the deep venous system from the level of the common femoral vein through the popliteal and proximal calf veins.  COMPARISON:  12/21/2014 and earlier studies  FINDINGS: Normal compressibility of the common femoral, superficial femoral, and popliteal veins, as well as the proximal calf veins. No filling defects to suggest DVT on grayscale or color Doppler imaging. Doppler waveforms show normal direction of venous flow, normal respiratory phasicity and response to augmentation. Scattered arterial calcifications are noted in both lower extremities. Visualized segments of the saphenous venous system normal in caliber and compressibility.  IMPRESSION: 1. No evidence of lower extremity deep vein thrombosis, BILATERALLY.   Electronically Signed   By: Lucrezia Europe M.D.   On: 01/27/2015 14:51    EKG:  Orders placed or performed during the hospital encounter of 01/26/15  . ED EKG  . ED EKG  . EKG 12-Lead  . EKG 12-Lead    ASSESSMENT AND PLAN:  Active Problems:   CHF (congestive heart failure)   Pressure ulcer 1. Asthma exacerbation. Continue steroids, nebulizers, Zithromax  2,. CHF, chronic systolic CHF. Echocardiogram significant systolic dysfunction as well as multi-valvular regurg as well as pulmonary hypertension 3. CK D stage III, seems to be stable close to baseline monitor for now 4. Elevated troponin. Due to demand ischemia 5. Lower extremity swelling, no DVT per Doppler ultrasound, continue torsemide, following clinically 6. Generalized weakness, rehabilitation tomorrow 7. Urinary tract  infection, pansensitive Escherichia coli and Klebsiella pneumonia Keflex should cover both.   Management plans discussed with the patient, family and they are in agreement.   DRUG ALLERGIES:  Allergies  Allergen Reactions  . Ibuprofen Other (See Comments)    Pt states that she can't take because of her kidneys.    . Motrin Pm [Ibuprofen-Diphenhydramine Cit] Other (See Comments)    Pt states that she can't take because of her kidneys.    . Tylenol [Acetaminophen] Other (See Comments)    Pt states that she can't take because it is contraindicated with her Gleevec.      CODE STATUS:     Code Status Orders        Start     Ordered   01/26/15 1717  Full code   Continuous     01/26/15 1716      TOTAL TIME TAKING CARE OF THIS PATIENT: 35 minutes.    Dustin Flock M.D on 01/29/2015 at 11:10 AM  Between 7am to 6pm - Pager - [phone]  After 6pm go to [email or web address] - password [email or web address] [facility]  Office  [phone]  CC: Primary care physician; Pcp Not In System

## 2015-01-29 NOTE — Progress Notes (Signed)
PATIENT ID: [id number] with paroxysmal atrial fibrillation, hypertension, CKD, chronic systolic heart failure, mod-severe MR and TR, and sacral decubitus ulcers who presented with a fall and was found to be in acute on chronic systolic heart failure.  INTERVAL HISTORY: Ms. [name] refused her PM torsemide and would not start spironolactone yesterday.  She did take her medications this AM.  SUBJECTIVE:  Feeling better. Has been out of bed to chair but has not been walking yet.  Daughter is looking for a SNF that the family is comfortable with.   PHYSICAL EXAM Filed Vitals:   01/28/15 2357 01/29/15 0444 01/29/15 1008 01/29/15 1143  BP: 119/68 117/73  133/80  Pulse: 96 90 106 83  Temp:  98.4 F (36.9 C)  97.9 F (36.6 C)  TempSrc:  Oral  Oral  Resp: 20 20  18   Height:      Weight:      SpO2: 97% 97%  98%   General:  Well-appearing.  Sitting up in chair in NAD Neck: JVP to mid neck sitting upright.  +TR Lungs:  Diminished at bases.  No crackles, rhonchi or wheezes. Heart:  Irregularly irregular.  No rubs or gallops. III/VI holosystolic murmur at the apex. Abdomen:  Soft, NT, ND. Extremities:  WWP.  No edema.  2+ DP/PT  LABS: Lab Results  Component Value Date   TROPONINI 0.38* 01/26/2015   Results for orders placed or performed during the hospital encounter of 01/26/15 (from the past 24 hour(s))  Potassium     Status: None   Collection Time: 01/28/15  3:25 PM  Result Value Ref Range   Potassium 3.6 3.5 - 5.1 mmol/L  Magnesium     Status: None   Collection Time: 01/28/15  3:25 PM  Result Value Ref Range   Magnesium 1.7 1.7 - 2.4 mg/dL    Intake/Output Summary (Last 24 hours) at 01/29/15 1337 Last data filed at 01/29/15 0830  Gross per 24 hour  Intake    360 ml  Output      0 ml  Net    360 ml    Telemetry:  Atrial fibrillation rates mostly <100 bpm.  Frequent PVCs.  Echo 01/27/15: Study Conclusions  - Left ventricle: The cavity size was normal. There was  moderate concentric hypertrophy. Systolic function was severely reduced. The estimated ejection fraction was in the range of 25% to 30%. Diffuse hypokinesis. The study is not technically sufficient to allow evaluation of LV diastolic function. - Mitral valve: There was moderate regurgitation. - Left atrium: The atrium was severely dilated. - Right atrium: The atrium was moderately dilated. - Tricuspid valve: There was moderate regurgitation. - Pulmonary arteries: Systolic pressure was moderately increased. PA peak pressure: 52 mm Hg (S).   ASSESSMENT AND PLAN: # Acute on chronic systolic heart failure/ Moderate-severe MR/TR: Non-ischemic systolic heart failure. [name] presented with decompensated heart failure in the setting of missing several days of diuretics. Her LVEF is lower than reported last month. She has known moderate-severe valvular disease that appeared slightly improved this admission when compared with her echo report from 12/05/14. If we consider her valvular disease to be severe, technically she has a Class I indication for mitral valve replacement/tricuspid valve repair. However, she seems to be a poor candidate for such a large operation given her debility (sacral decubs, falls, advanced age). Her STS risk of mortality for mitral valve replacement alone is 7.8%. This does not include the risk for concomminent tricuspid valve repair. Should she  recover from this hospitalization and follow up for evaluation in heart failure clinic, she may benefit from consideration of RHC with possible inotropes or Mitraclip. Compliance may be a limitation to advanced heart failure therapies. Given that her LVEF is <35% and she does not have BP room for beta blocker or ACE-I therapy, will need to consider Life vest and possible ICD.  Patient desires to be full code but does not want prolonged ventilator support. - Continue digoxin - Started spironolactone 12.5mg  - Measure In/Out  and daily weights.  Cannot objectively assess volume changes without this. - No ACE-I or beta blocker due to low BP - Replete K>4, Mg >2 - Agree with continuing her home po regimen of diuresis - Will need close follow up in HF clinic - [name] was scheduled to be seen in the heart failure clinic 01/12/15. However she cancelled this appointment and has a follow up scheduled on 02/14/2015.  # NSTEMI: Cardiac enzymes were elevated, likely in the setting of demand ischemia from her heart failure exacerbation. She denies chest pain and there are no ischemic changes on ECG. She has frequent PVCs vs aberrantly conducted atrial fibrillation both on ECG and on telemetry. However, on review of her prior ECGs, this is unchanged from prior. I do not think this represents acute ischemia. - Continue aspirin - Check AM lipid panel.  Consider statin if considerably elevated - Will try to start low-dose beta blocker if BP will allow - Consider an ischemia evaluation as an outpatient  # Paroxysmal atrial fibrillation: Rate well-controlled. - Continue digoxin - Continue aspirin 325mg  daily. Ideally she would be on anticoagulation given her CHADS2-VASc of 6. Will defer to Dr. Clayborn Bigness. This patients CHA2DS2-VASc Score and unadjusted Ischemic Stroke Rate (% per year) is equal to 9.7 % stroke rate/year from a score of 6  Above score calculated as 1 point each if present [CHF, HTN, DM, Vascular=MI/PAD/Aortic Plaque, Age if 65-74, or Female] Above score calculated as 2 points each if present [Age > 75, or Stroke/TIA/TE]  Active Problems:   CHF (congestive heart failure)   Pressure ulcer    Skeet Latch P 01/29/2015 1:37 PM

## 2015-01-29 NOTE — Progress Notes (Signed)
Patient is requesting something else for pain and cannot wait until oxycodone is due again. Since patient has no IV access, MD ordered subQ dilaudid PRN.

## 2015-01-30 ENCOUNTER — Encounter: Admission: RE | Admit: 2015-01-30 | Payer: Medicare Other | Source: Ambulatory Visit | Admitting: Internal Medicine

## 2015-01-30 DIAGNOSIS — I5023 Acute on chronic systolic (congestive) heart failure: Secondary | ICD-10-CM | POA: Diagnosis not present

## 2015-01-30 LAB — LIPID PANEL
CHOL/HDL RATIO: 2.3 ratio
CHOLESTEROL: 164 mg/dL (ref 0–200)
HDL: 71 mg/dL (ref >40)
LDL Cholesterol: 87 mg/dL (ref 0–99)
Triglycerides: 31 mg/dL (ref <150)
VLDL: 6 mg/dL (ref 0–40)

## 2015-01-30 MED ORDER — TRAMADOL HCL 50 MG PO TABS: ORAL_TABLET | ORAL | Status: AC

## 2015-01-30 MED ORDER — CEPHALEXIN 250 MG PO CAPS: 250.0000 mg | ORAL_CAPSULE | Freq: Three times a day (TID) | ORAL | Status: DC

## 2015-01-30 MED ORDER — OXYCODONE HCL 10 MG PO TABS: 5.0000 mg | ORAL_TABLET | Freq: Four times a day (QID) | ORAL | Status: AC | PRN

## 2015-01-30 MED ORDER — IPRATROPIUM-ALBUTEROL 0.5-2.5 (3) MG/3ML IN SOLN: 3.0000 mL | RESPIRATORY_TRACT | Status: AC | PRN

## 2015-01-30 MED ORDER — DIAZEPAM 5 MG PO TABS: 5.0000 mg | ORAL_TABLET | Freq: Two times a day (BID) | ORAL | Status: AC | PRN

## 2015-01-30 NOTE — Clinical Social Work Note (Signed)
Patient has had a bed offer from a facility of choice: [facility]. Patient is in agreement to transfer to this facility today. Discharge summary sent to [facility]. Patient requests transport via ems due to her daughter being at work. When informed that EMS may not be covered as she is minimally ambulatory, patient replied that her daughter stated it did not matter what it cost that she needed to go via EMS. CSW complied with her request. CSW has also left a message for her daughter at work: [phone] regarding notification of discharge. Shela Leff MSW,LCSW [phone]

## 2015-01-30 NOTE — Discharge Instructions (Signed)
Heart Failure Clinic appointment on February 13, 2015 at 1:00pm with Darylene Price, [place]. Please call [phone] to reschedule.    DIET:  Cardiac diet  DISCHARGE CONDITION:  Stable  ACTIVITY:  Activity as tolerated  OXYGEN:  Home Oxygen: No.   Oxygen Delivery: room air  DISCHARGE LOCATION:  nursing home    ADDITIONAL DISCHARGE INSTRUCTION:   If you experience worsening of your admission symptoms, develop shortness of breath, life threatening emergency, suicidal or homicidal thoughts you must seek medical attention immediately by calling 911 or calling your MD immediately  if symptoms less severe.  You Must read complete instructions/literature along with all the possible adverse reactions/side effects for all the Medicines you take and that have been prescribed to you. Take any new Medicines after you have completely understood and accpet all the possible adverse reactions/side effects.   Please note  You were cared for by a hospitalist during your hospital stay. If you have any questions about your discharge medications or the care you received while you were in the hospital after you are discharged, you can call the unit and asked to speak with the hospitalist on call if the hospitalist that took care of you is not available. Once you are discharged, your primary care physician will handle any further medical issues. Please note that NO REFILLS for any discharge medications will be authorized once you are discharged, as it is imperative that you return to your primary care physician (or establish a relationship with a primary care physician if you do not have one) for your aftercare needs so that they can reassess your need for medications and monitor your lab values.

## 2015-01-30 NOTE — Discharge Summary (Signed)
[name], 81 y.o., DOB [birth year], MRN [id number]. Admission date: 01/26/2015 Discharge Date 01/30/2015 Primary MD Pcp Not In System Admitting Physician Loletha Grayer, MD  Admission Diagnosis  Edema [R60.9] Elevated troponin [R79.89] Unable to walk [R26.2] Cystitis [N30.90] Tailbone injury, initial encounter [I45.80DX] Fall, initial encounter [W19.XXXA] Urinary tract infection without hematuria, site unspecified [N39.0] Acute on chronic congestive heart failure, unspecified congestive heart failure type [I50.9]  Discharge Diagnosis   Active Problems:   Acute on chronic systolic CHF (congestive heart failure) exasperation   Pressure ulcer   Elevated troponin   Moderate to severe mitral and tricuspid regurg Pulmonary hypertension Chronic kidney disease stage III Urinary tract inspection           Hospital Course patient is 81 year old female with history of COPD, asthma, as well as CHF who presents to the hospital with shortness of breath. Patient was unable to take her diarrhetic's due to weakness. She was seen in the ED and admitted for acute CHF exasperation. Echocardiogram did show severe systolic dysfunction as well as mitral and tricuspid regurg. She was treated with diuresis. She was seen by cardiology due to elevated troponin. She was recommended to continue current medical therapy. Patient also had a fall on presentation and was seen by physical therapy she is very weak and deconditioned and needs rehabilitation which is being arranged. Her breathing is now improved and is currently stable for discharge to skilled nurse facility. Patient also noticed to have a urinary tract infection which was treated with anabiotic's with improvement on no other symptoms.             Consults  cardiology  Significant Tests:  See full reports for all details      Dg Chest 2 View  01/26/2015   CLINICAL DATA:  Dyspnea after fall.  EXAM: CHEST  2 VIEW  COMPARISON:  December 21, 2014.  FINDINGS: Stable cardiomegaly. No pneumothorax or pleural effusion is noted. No acute pulmonary disease is noted. Severe degenerative changes seen involving the right glenohumeral joint.  IMPRESSION: Stable cardiomegaly.  No acute pulmonary disease is noted.   Electronically Signed   By: Marijo Conception, M.D.   On: 01/26/2015 10:19   Dg Lumbar Spine 2-3 Views  01/26/2015   CLINICAL DATA:  [facility] patient. Fall while walking 2 bathroom with walker. Low back pain and sacral pain.  EXAM: LUMBAR SPINE - 2-3 VIEW  COMPARISON:  CT 11/14/2014  FINDINGS: Essentially normal alignment of the lumbar vertebral bodies. There is joint space widening at L3-L4 similar to comparison CT. There is joint space narrowing at L1-L2, L2-L3 and L4-L5. There is mild anterolisthesis of L3 on L4 by 5 mm which is also unchanged from comparison CT. On the frontal projection there is mild dextroscoliosis.  IMPRESSION: Multiple levels of degenerative change in the lumbar spine are similar to comparison CT of 11/14/2014. No acute findings.   Electronically Signed   By: Suzy Bouchard M.D.   On: 01/26/2015 10:21   Dg Sacrum/coccyx  01/26/2015   CLINICAL DATA:  Dyspnea, fall, low back pain  EXAM: SACRUM AND COCCYX - 2+ VIEW  COMPARISON:  None.  FINDINGS: There is generalized osteopenia. The sacrum is limited evaluation secondary to overlying bowel gas. No gross sacral fracture.  Right total hip arthroplasty. Moderate osteoarthritis of the left hip. Mild degenerative changes of bilateral sacroiliac joints. Degenerative disc disease at L4-5 and L5-S1 with bilateral facet arthropathy.  There is peripheral vascular atherosclerotic disease.  IMPRESSION: No acute osseous injury of the of the sacrum.   Electronically Signed   By: Kathreen Devoid   On: 01/26/2015 10:20   US Venous Img Lower Bilateral  01/27/2015   CLINICAL DATA:  Bilateral lower extremity swelling x1 month  EXAM: BILATERAL LOWER EXTREMITY VENOUS DOPPLER  ULTRASOUND  TECHNIQUE: Gray-scale sonography with compression, as well as color and duplex ultrasound, were performed to evaluate the deep venous system from the level of the common femoral vein through the popliteal and proximal calf veins.  COMPARISON:  12/21/2014 and earlier studies  FINDINGS: Normal compressibility of the common femoral, superficial femoral, and popliteal veins, as well as the proximal calf veins. No filling defects to suggest DVT on grayscale or color Doppler imaging. Doppler waveforms show normal direction of venous flow, normal respiratory phasicity and response to augmentation. Scattered arterial calcifications are noted in both lower extremities. Visualized segments of the saphenous venous system normal in caliber and compressibility.  IMPRESSION: 1. No evidence of lower extremity deep vein thrombosis, BILATERALLY.   Electronically Signed   By: Lucrezia Europe M.D.   On: 01/27/2015 14:51       Today   Subjective:   [name]  breathing improved complains of right shoulder pain which she has at home chronically unable to take NSAIDs  Objective:   Blood pressure 117/70, pulse 87, temperature 98.4 F (36.9 C), temperature source Oral, resp. rate 20, height 5\' 5"  (1.651 m), weight 69.037 kg (152 lb 3.2 oz), SpO2 100 %.  .  Intake/Output Summary (Last 24 hours) at 01/30/15 0956 Last data filed at 01/29/15 1630  Gross per 24 hour  Intake    240 ml  Output      0 ml  Net    240 ml    Exam VITAL SIGNS: Blood pressure 117/70, pulse 87, temperature 98.4 F (36.9 C), temperature source Oral, resp. rate 20, height 5\' 5"  (1.651 m), weight 69.037 kg (152 lb 3.2 oz), SpO2 100 %.  GENERAL:  81 y.o.-year-old patient patient lying in the bed with no acute distress.  EYES: Pupils equal, round, reactive to light and accommodation. No scleral icterus. Extraocular muscles intact.  HEENT: Head atraumatic, normocephalic. Oropharynx and nasopharynx clear.  NECK:  Supple, no jugular venous  distention. No thyroid enlargement, no tenderness.  LUNGS: Normal breath sounds bilaterally, no wheezing, rales,rhonchi or crepitation. No use of accessory muscles of respiration.  CARDIOVASCULAR: S1, S2 normal. No murmurs, rubs, or gallops.  ABDOMEN: Soft, nontender, nondistended. Bowel sounds present. No organomegaly or mass.  EXTREMITIES: No pedal edema, cyanosis, or clubbing.  NEUROLOGIC: Cranial nerves II through XII are intact. Muscle strength 5/5 in all extremities. Sensation intact. Gait not checked.  PSYCHIATRIC: The patient is alert and oriented x 3.  SKIN: No obvious rash, lesion, or ulcer.   Data Review     CBC w Diff: Lab Results  Component Value Date   WBC 11.6* 01/27/2015   WBC 6.9 06/07/2014   WBC 4.9 05/31/2014   HGB 10.5* 01/27/2015   HGB 10.1* 05/31/2014   HCT 32.5* 01/27/2015   HCT 31.8* 05/31/2014   PLT 138* 01/27/2015   PLT 96* 06/03/2014   LYMPHOPCT 12 01/26/2015   LYMPHOPCT 30.4 02/21/2014   MONOPCT 7 01/26/2015   MONOPCT 13.3 02/21/2014   EOSPCT 2 01/26/2015   EOSPCT 5.9 02/21/2014   BASOPCT 1 01/26/2015   BASOPCT 0.9 02/21/2014   CMP: Lab Results  Component Value Date   NA 140 01/28/2015   NA  147 06/07/2014   NA 142 06/03/2014   K 3.6 01/28/2015   K 3.5 06/03/2014   CL 101 01/28/2015   CL 102 06/03/2014   CO2 31 01/28/2015   CO2 32 06/03/2014   BUN 40* 01/28/2015   BUN 43* 06/07/2014   BUN 40* 06/03/2014   CREATININE 1.61* 01/28/2015   CREATININE 1.7* 06/07/2014   CREATININE 1.77* 06/03/2014   GLU 147 06/07/2014   PROT 6.8 01/26/2015   PROT 6.6 05/31/2014   ALBUMIN 4.2 01/26/2015   ALBUMIN 3.7 05/31/2014   BILITOT 0.8 01/26/2015   BILITOT 0.4 05/31/2014   ALKPHOS 46 01/26/2015   ALKPHOS 33* 05/31/2014   AST 53* 01/26/2015   AST 52* 05/31/2014   ALT 26 01/26/2015   ALT 25 05/31/2014  .  Micro Results Recent Results (from the past 240 hour(s))  Urine culture     Status: None   Collection Time: 01/26/15  2:25 PM  Result Value  Ref Range Status   Specimen Description URINE, RANDOM  Final   Special Requests Normal  Final   Culture   Final    >=100,000 COLONIES/mL ESCHERICHIA COLI >=100,000 COLONIES/mL KLEBSIELLA PNEUMONIAE    Report Status 01/29/2015 FINAL  Final   Organism ID, Bacteria ESCHERICHIA COLI  Final   Organism ID, Bacteria KLEBSIELLA PNEUMONIAE  Final      Susceptibility   Escherichia coli - MIC*    AMPICILLIN 4 SENSITIVE Sensitive     CEFTAZIDIME <=1 SENSITIVE Sensitive     CEFAZOLIN <=4 SENSITIVE Sensitive     CEFTRIAXONE <=1 SENSITIVE Sensitive     CIPROFLOXACIN <=0.25 SENSITIVE Sensitive     GENTAMICIN <=1 SENSITIVE Sensitive     IMIPENEM <=0.25 SENSITIVE Sensitive     TRIMETH/SULFA <=20 SENSITIVE Sensitive     NITROFURANTOIN Value in next row Sensitive      SENSITIVE<=16    PIP/TAZO Value in next row Sensitive      SENSITIVE<=4    LEVOFLOXACIN Value in next row Sensitive      SENSITIVE<0.12    * >=100,000 COLONIES/mL ESCHERICHIA COLI   Klebsiella pneumoniae - MIC*    AMPICILLIN Value in next row Resistant      SENSITIVE<0.12    CEFTAZIDIME Value in next row Sensitive      SENSITIVE<0.12    CEFAZOLIN Value in next row Sensitive      SENSITIVE<0.12    CEFTRIAXONE Value in next row Sensitive      SENSITIVE<0.12    CIPROFLOXACIN Value in next row Sensitive      SENSITIVE<0.12    GENTAMICIN Value in next row Sensitive      SENSITIVE<0.12    IMIPENEM Value in next row Sensitive      SENSITIVE<0.12    TRIMETH/SULFA Value in next row Sensitive      SENSITIVE<0.12    NITROFURANTOIN Value in next row Intermediate      INTERMEDIATE64    PIP/TAZO Value in next row Sensitive      SENSITIVE8    LEVOFLOXACIN Value in next row Sensitive      SENSITIVE<=0.12    * >=100,000 COLONIES/mL KLEBSIELLA PNEUMONIAE  MRSA PCR Screening     Status: None   Collection Time: 01/26/15  7:29 PM  Result Value Ref Range Status   MRSA by PCR NEGATIVE NEGATIVE Final        Code Status Orders         Start     Ordered   01/26/15 1717  Full code  Continuous     01/26/15 1716          Follow-up Information    Follow up with Pcp Not In System In 2 days.   Why:  For recheck      Follow up with Alisa Graff, FNP. Go on 03/02/2015.   Specialty:  Family Medicine   Why:  at 1:00pm , to [facility]   Contact information:   [address] [id number] [phone]       Follow up with pcp In 7 days.      Discharge Medications     Medication List    TAKE these medications        aspirin EC 325 MG tablet  Take 325 mg by mouth daily.     baclofen 10 MG tablet  Commonly known as:  LIORESAL  Take 1 tablet by mouth 2 (two) times daily as needed.     brimonidine 0.15 % ophthalmic solution  Commonly known as:  ALPHAGAN  Place 1 drop into both eyes 2 (two) times daily.     brinzolamide 1 % ophthalmic suspension  Commonly known as:  AZOPT  Place 1 drop into both eyes 2 (two) times daily.     budesonide-formoterol 160-4.5 MCG/ACT inhaler  Commonly known as:  SYMBICORT  Inhale 2 puffs into the lungs 2 (two) times daily.     cephALEXin 250 MG capsule  Commonly known as:  KEFLEX  Take 1 capsule (250 mg total) by mouth every 8 (eight) hours.     diazepam 5 MG tablet  Commonly known as:  VALIUM  Take one tablet by mouth every 12 hours as needed for anxiety/nervousness.     diazepam 5 MG tablet  Commonly known as:  VALIUM  Take 1 tablet (5 mg total) by mouth every 12 (twelve) hours as needed for anxiety.     digoxin 0.125 MG tablet  Commonly known as:  LANOXIN  Take 0.125 mg by mouth daily.     diphenoxylate-atropine 2.5-0.025 MG per tablet  Commonly known as:  LOMOTIL  Take two tablets by mouth four times daily as needed for diarrhea     FERREX 150 150 MG capsule  Generic drug:  iron polysaccharides  TAKE 2 CAPSULES BY MOUTH EVERY DAY     imatinib 100 MG tablet  Commonly known as:  GLEEVEC  Take 400 mg by mouth  daily.     ipratropium-albuterol 0.5-2.5 (3) MG/3ML Soln  Commonly known as:  DUONEB  Take 3 mLs by nebulization every 4 (four) hours as needed.     latanoprost 0.005 % ophthalmic solution  Commonly known as:  XALATAN  Place 1 drop into both eyes at bedtime.     levalbuterol 1.25 MG/3ML nebulizer solution  Commonly known as:  XOPENEX  Take 1.25 mg by nebulization every 6 (six) hours as needed for wheezing.     levalbuterol 45 MCG/ACT inhaler  Commonly known as:  XOPENEX HFA  Inhale 2 puffs into the lungs every 4 (four) hours as needed for wheezing.     metolazone 5 MG tablet  Commonly known as:  ZAROXOLYN  Take 5 mg by mouth 2 (two) times a week. Pt takes on Tuesday and Friday.     montelukast 10 MG tablet  Commonly known as:  SINGULAIR  Take 10 mg by mouth at bedtime.     oxyCODONE 5 MG immediate release tablet  Commonly known as:  ROXICODONE  Take  1 tablet (5 mg total) by mouth every 8 (eight) hours as needed.     Oxycodone HCl 10 MG Tabs  Take 0.5 tablets (5 mg total) by mouth every 6 (six) hours as needed for moderate pain.     predniSONE 10 MG tablet  Commonly known as:  DELTASONE  Take 10 mg by mouth every other day.     theophylline 200 MG 12 hr tablet  Commonly known as:  THEODUR  Take 200 mg by mouth 2 (two) times daily with a meal.     torsemide 20 MG tablet  Commonly known as:  DEMADEX  Take 40 mg by mouth 2 (two) times a week. Pt takes on Tuesday and Friday.     traMADol 50 MG tablet  Commonly known as:  ULTRAM  Take two tablets by mouth three times daily as needed for pain     Vitamin D (Ergocalciferol) 50000 UNITS Caps capsule  Commonly known as:  DRISDOL  Take 50,000 Units by mouth every 7 (seven) days.           Total Time in preparing paper work, data evaluation and todays exam - 35 minutes  Dustin Flock M.D on 01/30/2015 at 9:56 AM  [facility]   Office  [phone]

## 2015-01-30 NOTE — Progress Notes (Signed)
Telemetry box returned to desk upon EMS arrival. Patient transported to [facility].

## 2015-01-30 NOTE — Care Management Important Message (Signed)
Important Message  Patient Details  Name: [name] MRN: [id number] Date of Birth: 20-Jun-1933   Medicare Important Message Given:  Yes-second notification given    Juliann Pulse A Allmond 01/30/2015, 9:30 AM

## 2015-01-30 NOTE — Progress Notes (Signed)
Discharge packet prepared by social work. AVS given to patient for her own reference. No IV to remove. Education on heart failure given to patient. Prescriptions are in packet from social work. Patient has no further questions. Report called to [facility] RN: Anderson Malta. EMS called for transport. Patient is dressed and dressing has been changed. Flu shot given before discharge.

## 2015-02-04 ENCOUNTER — Encounter
Admission: RE | Admit: 2015-02-04 | Discharge: 2015-02-04 | Disposition: A | Discharge status: Home / Self Care | Payer: Medicare Other | Source: Ambulatory Visit | Attending: Internal Medicine | Admitting: Internal Medicine

## 2015-02-11 ENCOUNTER — Inpatient Hospital Stay (HOSPITAL_COMMUNITY)
Admission: EM | Admit: 2015-02-11 | Discharge: 2015-03-07 | DRG: 308 | Disposition: E | Payer: Medicare Other | Attending: Internal Medicine | Admitting: Internal Medicine

## 2015-02-11 ENCOUNTER — Emergency Department (HOSPITAL_COMMUNITY): Payer: Medicare Other | Admitting: Certified Registered Nurse Anesthetist

## 2015-02-11 ENCOUNTER — Encounter (HOSPITAL_COMMUNITY): Payer: Self-pay | Admitting: *Deleted

## 2015-02-11 ENCOUNTER — Encounter (HOSPITAL_COMMUNITY): Admission: EM | Disposition: E | Payer: Self-pay | Source: Home / Self Care | Attending: Internal Medicine

## 2015-02-11 ENCOUNTER — Emergency Department (HOSPITAL_COMMUNITY): Payer: Medicare Other

## 2015-02-11 DIAGNOSIS — N179 Acute kidney failure, unspecified: Secondary | ICD-10-CM | POA: Diagnosis present

## 2015-02-11 DIAGNOSIS — Z66 Do not resuscitate: Secondary | ICD-10-CM | POA: Diagnosis present

## 2015-02-11 DIAGNOSIS — I4589 Other specified conduction disorders: Secondary | ICD-10-CM | POA: Diagnosis present

## 2015-02-11 DIAGNOSIS — Z79899 Other long term (current) drug therapy: Secondary | ICD-10-CM | POA: Diagnosis not present

## 2015-02-11 DIAGNOSIS — I2583 Coronary atherosclerosis due to lipid rich plaque: Secondary | ICD-10-CM

## 2015-02-11 DIAGNOSIS — M21372 Foot drop, left foot: Secondary | ICD-10-CM | POA: Diagnosis present

## 2015-02-11 DIAGNOSIS — Z7952 Long term (current) use of systemic steroids: Secondary | ICD-10-CM | POA: Diagnosis not present

## 2015-02-11 DIAGNOSIS — G931 Anoxic brain damage, not elsewhere classified: Secondary | ICD-10-CM | POA: Diagnosis present

## 2015-02-11 DIAGNOSIS — J449 Chronic obstructive pulmonary disease, unspecified: Secondary | ICD-10-CM | POA: Diagnosis present

## 2015-02-11 DIAGNOSIS — R739 Hyperglycemia, unspecified: Secondary | ICD-10-CM | POA: Diagnosis present

## 2015-02-11 DIAGNOSIS — D649 Anemia, unspecified: Secondary | ICD-10-CM | POA: Diagnosis present

## 2015-02-11 DIAGNOSIS — I5022 Chronic systolic (congestive) heart failure: Secondary | ICD-10-CM | POA: Diagnosis present

## 2015-02-11 DIAGNOSIS — I462 Cardiac arrest due to underlying cardiac condition: Secondary | ICD-10-CM | POA: Diagnosis present

## 2015-02-11 DIAGNOSIS — N19 Unspecified kidney failure: Secondary | ICD-10-CM | POA: Diagnosis not present

## 2015-02-11 DIAGNOSIS — I4901 Ventricular fibrillation: Secondary | ICD-10-CM | POA: Diagnosis present

## 2015-02-11 DIAGNOSIS — L89154 Pressure ulcer of sacral region, stage 4: Secondary | ICD-10-CM | POA: Diagnosis present

## 2015-02-11 DIAGNOSIS — I428 Other cardiomyopathies: Secondary | ICD-10-CM | POA: Diagnosis present

## 2015-02-11 DIAGNOSIS — N189 Chronic kidney disease, unspecified: Secondary | ICD-10-CM | POA: Diagnosis present

## 2015-02-11 DIAGNOSIS — E785 Hyperlipidemia, unspecified: Secondary | ICD-10-CM | POA: Diagnosis present

## 2015-02-11 DIAGNOSIS — J189 Pneumonia, unspecified organism: Secondary | ICD-10-CM

## 2015-02-11 DIAGNOSIS — J45909 Unspecified asthma, uncomplicated: Secondary | ICD-10-CM | POA: Diagnosis present

## 2015-02-11 DIAGNOSIS — I469 Cardiac arrest, cause unspecified: Secondary | ICD-10-CM | POA: Diagnosis present

## 2015-02-11 DIAGNOSIS — R34 Anuria and oliguria: Secondary | ICD-10-CM | POA: Diagnosis present

## 2015-02-11 DIAGNOSIS — J9600 Acute respiratory failure, unspecified whether with hypoxia or hypercapnia: Secondary | ICD-10-CM | POA: Diagnosis present

## 2015-02-11 DIAGNOSIS — I2510 Atherosclerotic heart disease of native coronary artery without angina pectoris: Secondary | ICD-10-CM | POA: Diagnosis present

## 2015-02-11 DIAGNOSIS — I129 Hypertensive chronic kidney disease with stage 1 through stage 4 chronic kidney disease, or unspecified chronic kidney disease: Secondary | ICD-10-CM | POA: Diagnosis present

## 2015-02-11 DIAGNOSIS — Z886 Allergy status to analgesic agent status: Secondary | ICD-10-CM | POA: Diagnosis not present

## 2015-02-11 DIAGNOSIS — I5023 Acute on chronic systolic (congestive) heart failure: Secondary | ICD-10-CM | POA: Diagnosis not present

## 2015-02-11 DIAGNOSIS — I081 Rheumatic disorders of both mitral and tricuspid valves: Secondary | ICD-10-CM | POA: Diagnosis present

## 2015-02-11 DIAGNOSIS — Z8673 Personal history of transient ischemic attack (TIA), and cerebral infarction without residual deficits: Secondary | ICD-10-CM

## 2015-02-11 DIAGNOSIS — Z7982 Long term (current) use of aspirin: Secondary | ICD-10-CM

## 2015-02-11 DIAGNOSIS — F419 Anxiety disorder, unspecified: Secondary | ICD-10-CM | POA: Diagnosis present

## 2015-02-11 DIAGNOSIS — I480 Paroxysmal atrial fibrillation: Secondary | ICD-10-CM | POA: Diagnosis present

## 2015-02-11 DIAGNOSIS — Z8249 Family history of ischemic heart disease and other diseases of the circulatory system: Secondary | ICD-10-CM | POA: Diagnosis not present

## 2015-02-11 DIAGNOSIS — R402432 Glasgow coma scale score 3-8, at arrival to emergency department: Secondary | ICD-10-CM | POA: Diagnosis present

## 2015-02-11 DIAGNOSIS — G9340 Encephalopathy, unspecified: Secondary | ICD-10-CM | POA: Diagnosis present

## 2015-02-11 DIAGNOSIS — J690 Pneumonitis due to inhalation of food and vomit: Secondary | ICD-10-CM | POA: Diagnosis present

## 2015-02-11 DIAGNOSIS — E162 Hypoglycemia, unspecified: Secondary | ICD-10-CM | POA: Diagnosis present

## 2015-02-11 DIAGNOSIS — E872 Acidosis: Secondary | ICD-10-CM | POA: Diagnosis present

## 2015-02-11 LAB — DIFFERENTIAL
BASOS PCT: 0 %
Basophils Absolute: 0 10*3/uL (ref 0.0–0.1)
EOS ABS: 0.1 10*3/uL (ref 0.0–0.7)
EOS PCT: 1 %
LYMPHS ABS: 4.5 10*3/uL — AB (ref 0.7–4.0)
Lymphocytes Relative: 46 %
MONO ABS: 0.4 10*3/uL (ref 0.1–1.0)
Monocytes Relative: 4 %
NEUTROS ABS: 4.8 10*3/uL (ref 1.7–7.7)
Neutrophils Relative %: 49 %

## 2015-02-11 LAB — BLOOD GAS, VENOUS
Acid-base deficit: 3.2 mmol/L — ABNORMAL HIGH (ref 0.0–2.0)
Bicarbonate: 23.1 mEq/L (ref 20.0–24.0)
FIO2: 1
O2 Saturation: 46.7 %
PEEP: 5 cmH2O
Patient temperature: 98.6
RATE: 20 resp/min
TCO2: 24.8 mmol/L (ref 0–100)
VT: 460 mL
pCO2, Ven: 56.1 mmHg — ABNORMAL HIGH (ref 45.0–50.0)
pH, Ven: 7.238 — ABNORMAL LOW (ref 7.250–7.300)
pO2, Ven: 31.1 mmHg (ref 30.0–45.0)

## 2015-02-11 LAB — I-STAT CHEM 8, ED
BUN: 52 mg/dL — AB (ref 6–20)
CALCIUM ION: 1.11 mmol/L — AB (ref 1.13–1.30)
CHLORIDE: 91 mmol/L — AB (ref 101–111)
CREATININE: 2 mg/dL — AB (ref 0.44–1.00)
GLUCOSE: 205 mg/dL — AB (ref 65–99)
HCT: 37 % (ref 36.0–46.0)
Hemoglobin: 12.6 g/dL (ref 12.0–15.0)
POTASSIUM: 3.5 mmol/L (ref 3.5–5.1)
Sodium: 135 mmol/L (ref 135–145)
TCO2: 27 mmol/L (ref 0–100)

## 2015-02-11 LAB — CREATININE, SERUM
CREATININE: 2.3 mg/dL — AB (ref 0.44–1.00)
GFR calc Af Amer: 22 mL/min — ABNORMAL LOW (ref >60)
GFR, EST NON AFRICAN AMERICAN: 19 mL/min — AB (ref >60)

## 2015-02-11 LAB — I-STAT TROPONIN, ED: Troponin i, poc: 1.38 ng/mL (ref 0.00–0.08)

## 2015-02-11 LAB — CBG MONITORING, ED: Glucose-Capillary: 112 mg/dL — ABNORMAL HIGH (ref 65–99)

## 2015-02-11 LAB — CBC
HCT: 32.5 % — ABNORMAL LOW (ref 36.0–46.0)
HCT: 32.2 % — ABNORMAL LOW (ref 36.0–46.0)
HEMATOCRIT: 36.1 % (ref 36.0–46.0)
HEMATOCRIT: 33.1 % — AB (ref 36.0–46.0)
HEMOGLOBIN: 10.1 g/dL — AB (ref 12.0–15.0)
Hemoglobin: 11.4 g/dL — ABNORMAL LOW (ref 12.0–15.0)
Hemoglobin: 10.5 g/dL — ABNORMAL LOW (ref 12.0–15.0)
Hemoglobin: 10.5 g/dL — ABNORMAL LOW (ref 12.0–15.0)
MCH: 28 pg (ref 26.0–34.0)
MCH: 28.4 pg (ref 26.0–34.0)
MCH: 28.7 pg (ref 26.0–34.0)
MCH: 28.6 pg (ref 26.0–34.0)
MCHC: 31.1 g/dL (ref 30.0–36.0)
MCHC: 31.6 g/dL (ref 30.0–36.0)
MCHC: 31.7 g/dL (ref 30.0–36.0)
MCHC: 32.6 g/dL (ref 30.0–36.0)
MCV: 90 fL (ref 78.0–100.0)
MCV: 90 fL (ref 78.0–100.0)
MCV: 90.4 fL (ref 78.0–100.0)
MCV: 87.7 fL (ref 78.0–100.0)
PLATELETS: 158 10*3/uL (ref 150–400)
PLATELETS: 194 10*3/uL (ref 150–400)
PLATELETS: 185 10*3/uL (ref 150–400)
PLATELETS: 191 10*3/uL (ref 150–400)
RBC: 3.61 MIL/uL — ABNORMAL LOW (ref 3.87–5.11)
RBC: 4.01 MIL/uL (ref 3.87–5.11)
RBC: 3.66 MIL/uL — ABNORMAL LOW (ref 3.87–5.11)
RBC: 3.67 MIL/uL — AB (ref 3.87–5.11)
RDW: 12.7 % (ref 11.5–15.5)
RDW: 12.7 % (ref 11.5–15.5)
RDW: 12.9 % (ref 11.5–15.5)
RDW: 12.8 % (ref 11.5–15.5)
WBC: 16 10*3/uL — ABNORMAL HIGH (ref 4.0–10.5)
WBC: 9.8 10*3/uL (ref 4.0–10.5)
WBC: 34 10*3/uL — ABNORMAL HIGH (ref 4.0–10.5)
WBC: 30.8 10*3/uL — ABNORMAL HIGH (ref 4.0–10.5)

## 2015-02-11 LAB — BASIC METABOLIC PANEL
Anion gap: 19 — ABNORMAL HIGH (ref 5–15)
BUN: 51 mg/dL — AB (ref 6–20)
CHLORIDE: 90 mmol/L — AB (ref 101–111)
CO2: 24 mmol/L (ref 22–32)
CREATININE: 2.27 mg/dL — AB (ref 0.44–1.00)
Calcium: 8.1 mg/dL — ABNORMAL LOW (ref 8.9–10.3)
GFR calc Af Amer: 22 mL/min — ABNORMAL LOW (ref >60)
GFR calc non Af Amer: 19 mL/min — ABNORMAL LOW (ref >60)
GLUCOSE: 240 mg/dL — AB (ref 65–99)
POTASSIUM: 4.3 mmol/L (ref 3.5–5.1)
SODIUM: 133 mmol/L — AB (ref 135–145)

## 2015-02-11 LAB — GLUCOSE, CAPILLARY
GLUCOSE-CAPILLARY: 122 mg/dL — AB (ref 65–99)
GLUCOSE-CAPILLARY: 111 mg/dL — AB (ref 65–99)
Glucose-Capillary: 126 mg/dL — ABNORMAL HIGH (ref 65–99)

## 2015-02-11 LAB — PROTIME-INR
INR: 1.38 (ref 0.00–1.49)
Prothrombin Time: 17.1 seconds — ABNORMAL HIGH (ref 11.6–15.2)

## 2015-02-11 LAB — I-STAT CG4 LACTIC ACID, ED: LACTIC ACID, VENOUS: 8.25 mmol/L — AB (ref 0.5–2.0)

## 2015-02-11 LAB — TROPONIN I: TROPONIN I: 1.78 ng/mL — AB (ref <0.031)

## 2015-02-11 LAB — MRSA PCR SCREENING: MRSA BY PCR: NEGATIVE

## 2015-02-11 LAB — MAGNESIUM: Magnesium: 2.7 mg/dL — ABNORMAL HIGH (ref 1.7–2.4)

## 2015-02-11 LAB — APTT: aPTT: 35 seconds (ref 24–37)

## 2015-02-11 SURGERY — LEFT HEART CATH AND CORONARY ANGIOGRAPHY
Anesthesia: LOCAL

## 2015-02-11 MED ORDER — INSULIN ASPART 100 UNIT/ML ~~LOC~~ SOLN
0.0000 [IU] | SUBCUTANEOUS | Status: DC
  Administered 2015-02-11 (×2): 1 [IU] via SUBCUTANEOUS

## 2015-02-11 MED ORDER — MIDAZOLAM HCL 2 MG/2ML IJ SOLN
2.0000 mg | INTRAMUSCULAR | Status: DC | PRN
  Administered 2015-02-11 – 2015-02-12 (×7): 2 mg via INTRAVENOUS
  Filled 2015-02-11 (×7): qty 2

## 2015-02-11 MED ORDER — HEPARIN SODIUM (PORCINE) 1000 UNIT/ML IJ SOLN
INTRAMUSCULAR | Status: AC
  Filled 2015-02-11: qty 1

## 2015-02-11 MED ORDER — FENTANYL CITRATE (PF) 100 MCG/2ML IJ SOLN
100.0000 ug | Freq: Once | INTRAMUSCULAR | Status: AC
Start: 2015-02-11 — End: 2015-02-11
  Administered 2015-02-11: 100 ug via INTRAVENOUS
  Filled 2015-02-11: qty 2

## 2015-02-11 MED ORDER — FENTANYL CITRATE (PF) 100 MCG/2ML IJ SOLN
50.0000 ug | INTRAMUSCULAR | Status: DC | PRN
  Administered 2015-02-11: 50 ug via INTRAVENOUS
  Administered 2015-02-11 – 2015-02-12 (×4): 100 ug via INTRAVENOUS
  Filled 2015-02-11 (×5): qty 2

## 2015-02-11 MED ORDER — MIDAZOLAM HCL 5 MG/5ML IJ SOLN: 5.0000 mg | Freq: Once | INTRAMUSCULAR | Status: DC

## 2015-02-11 MED ORDER — HEPARIN SODIUM (PORCINE) 5000 UNIT/ML IJ SOLN: 5000.0000 [IU] | Freq: Three times a day (TID) | INTRAMUSCULAR | Status: DC

## 2015-02-11 MED ORDER — FENTANYL CITRATE (PF) 100 MCG/2ML IJ SOLN
100.0000 ug | Freq: Once | INTRAMUSCULAR | Status: AC
  Administered 2015-02-11: 100 ug via INTRAVENOUS

## 2015-02-11 MED ORDER — HEPARIN SODIUM (PORCINE) 5000 UNIT/ML IJ SOLN: 4000.0000 [IU] | Freq: Once | INTRAMUSCULAR | Status: DC

## 2015-02-11 MED ORDER — ASPIRIN 81 MG PO CHEW: 324.0000 mg | CHEWABLE_TABLET | ORAL | Status: DC

## 2015-02-11 MED ORDER — MIDAZOLAM HCL 2 MG/2ML IJ SOLN
INTRAMUSCULAR | Status: AC
  Filled 2015-02-11: qty 6

## 2015-02-11 MED ORDER — SUCCINYLCHOLINE CHLORIDE 20 MG/ML IJ SOLN
INTRAMUSCULAR | Status: AC | PRN
  Administered 2015-02-11: 100 mg via INTRAVENOUS

## 2015-02-11 MED ORDER — MIDAZOLAM HCL 2 MG/2ML IJ SOLN
2.0000 mg | Freq: Once | INTRAMUSCULAR | Status: AC
  Administered 2015-02-11: 2 mg via INTRAVENOUS

## 2015-02-11 MED ORDER — ETOMIDATE 2 MG/ML IV SOLN
INTRAVENOUS | Status: DC | PRN
  Administered 2015-02-11: 10 mg via INTRAVENOUS

## 2015-02-11 MED ORDER — ETOMIDATE 2 MG/ML IV SOLN
INTRAVENOUS | Status: AC | PRN
  Administered 2015-02-11: 10 mg via INTRAVENOUS

## 2015-02-11 MED ORDER — VERAPAMIL HCL 2.5 MG/ML IV SOLN
INTRAVENOUS | Status: AC
  Filled 2015-02-11: qty 2

## 2015-02-11 MED ORDER — SODIUM CHLORIDE 0.9 % IV BOLUS (SEPSIS)
2000.0000 mL | Freq: Once | INTRAVENOUS | Status: AC
  Administered 2015-02-11: 2000 mL via INTRAVENOUS

## 2015-02-11 MED ORDER — CHLORHEXIDINE GLUCONATE 0.12% ORAL RINSE (MEDLINE KIT)
15.0000 mL | Freq: Two times a day (BID) | OROMUCOSAL | Status: DC
  Administered 2015-02-11 – 2015-02-12 (×2): 15 mL via OROMUCOSAL

## 2015-02-11 MED ORDER — SODIUM CHLORIDE 0.9 % IV BOLUS (SEPSIS)
500.0000 mL | Freq: Once | INTRAVENOUS | Status: AC
  Administered 2015-02-11: 500 mL via INTRAVENOUS

## 2015-02-11 MED ORDER — ANTISEPTIC ORAL RINSE SOLUTION (CORINZ)
7.0000 mL | OROMUCOSAL | Status: DC
  Administered 2015-02-11 – 2015-02-12 (×7): 7 mL via OROMUCOSAL

## 2015-02-11 MED ORDER — SODIUM CHLORIDE 0.9 % IV SOLN: 250.0000 mL | INTRAVENOUS | Status: DC | PRN

## 2015-02-11 MED ORDER — SUCCINYLCHOLINE CHLORIDE 20 MG/ML IJ SOLN
INTRAMUSCULAR | Status: DC | PRN
  Administered 2015-02-11: 100 mg via INTRAVENOUS

## 2015-02-11 MED ORDER — ASPIRIN 81 MG PO CHEW: 324.0000 mg | CHEWABLE_TABLET | Freq: Once | ORAL | Status: DC

## 2015-02-11 MED ORDER — PANTOPRAZOLE SODIUM 40 MG IV SOLR
40.0000 mg | Freq: Every day | INTRAVENOUS | Status: DC
  Administered 2015-02-11: 40 mg via INTRAVENOUS
  Filled 2015-02-11: qty 40

## 2015-02-11 MED ORDER — ASPIRIN 300 MG RE SUPP: 300.0000 mg | RECTAL | Status: DC

## 2015-02-11 MED ORDER — MIDAZOLAM HCL 2 MG/2ML IJ SOLN: 5.0000 mg | Freq: Once | INTRAMUSCULAR | Status: DC

## 2015-02-11 MED ORDER — HEPARIN (PORCINE) IN NACL 2-0.9 UNIT/ML-% IJ SOLN
INTRAMUSCULAR | Status: AC
  Filled 2015-02-11: qty 1500

## 2015-02-11 MED ORDER — FENTANYL CITRATE (PF) 100 MCG/2ML IJ SOLN
INTRAMUSCULAR | Status: AC
  Filled 2015-02-11: qty 2

## 2015-02-11 MED ORDER — LIDOCAINE HCL (PF) 1 % IJ SOLN
INTRAMUSCULAR | Status: AC
  Filled 2015-02-11: qty 30

## 2015-02-11 MED ORDER — SODIUM CHLORIDE 0.9 % IV SOLN
INTRAVENOUS | Status: DC
  Administered 2015-02-11: 15:00:00 via INTRAVENOUS

## 2015-02-11 MED ORDER — NITROGLYCERIN 1 MG/10 ML FOR IR/CATH LAB
INTRA_ARTERIAL | Status: AC
  Filled 2015-02-11: qty 10

## 2015-02-11 MED ORDER — IPRATROPIUM-ALBUTEROL 0.5-2.5 (3) MG/3ML IN SOLN
3.0000 mL | Freq: Four times a day (QID) | RESPIRATORY_TRACT | Status: DC
  Administered 2015-02-11 – 2015-02-12 (×4): 3 mL via RESPIRATORY_TRACT
  Filled 2015-02-11 (×5): qty 3

## 2015-02-11 NOTE — H&P (Signed)
PULMONARY / CRITICAL CARE MEDICINE   Name: [name] MRN: [id number] DOB: 1933-07-31    ADMISSION DATE:  02/06/2015 CONSULTATION DATE:  10/8  REFERRING MD :  Winfred Leeds   CHIEF COMPLAINT:  Cardiac arrest   INITIAL PRESENTATION:  81 year old female admitted s/p VF arrest. Down time estimated ~ 20 minutes. PCCM asked to admit. Not cooling candidate.   STUDIES:    SIGNIFICANT EVENTS:    HISTORY OF PRESENT ILLNESS:   81 y.o.female history of chronic systolic HF (echo 08/971 LVEF 25-30%), family reports several year history of low LVEF. Presents to ER 10/8 s/p VF arrest requiring 6 defibrillations as well as multiple rounds of epi. The exact time she is down is unclear by given the number of reported shocks would suggest around 20 minutes. She was followed initially by Dr Clayborn Bigness at [facility]. Family reports a cath about 2 years ago that was normal. She also has history of PAF, HTN, CKD, MR, TR.  At this time junctional rhythm with her chronic ventricular conduction delay. PCCM asked to admit   PAST MEDICAL HISTORY :   has a past medical history of Asthma; Anxiety; Hypertension; Hyperlipidemia; CVA (cerebral infarction); Paroxysmal a-fib ([place]); Renal insufficiency; COPD (chronic obstructive pulmonary disease) ([place]); Mitral valve insufficiency; CHF (congestive heart failure) ([place]); Stroke [facility]); Left foot drop; Nontraumatic partial bilateral rotator cuff tear; and Cancer ([place]).  has past surgical history that includes Back surgery; Joint replacement; Liver resection; Radiation implants to liver; and Lung nodule resection. Prior to Admission medications   Medication Sig Start Date End Date Taking? Authorizing Provider  aspirin EC 325 MG tablet Take 325 mg by mouth daily.    Historical Provider, MD  baclofen (LIORESAL) 10 MG tablet Take 1 tablet by mouth 2 (two) times daily as needed. 01/19/15   Historical Provider, MD  brimonidine (ALPHAGAN) 0.15 % ophthalmic solution Place 1  drop into both eyes 2 (two) times daily.    Historical Provider, MD  brinzolamide (AZOPT) 1 % ophthalmic suspension Place 1 drop into both eyes 2 (two) times daily.    Historical Provider, MD  budesonide-formoterol (SYMBICORT) 160-4.5 MCG/ACT inhaler Inhale 2 puffs into the lungs 2 (two) times daily.    Historical Provider, MD  cephALEXin (KEFLEX) 250 MG capsule Take 1 capsule (250 mg total) by mouth every 8 (eight) hours. 01/30/15   Dustin Flock, MD  diazepam (VALIUM) 5 MG tablet Take one tablet by mouth every 12 hours as needed for anxiety/nervousness. Patient taking differently: Take 5 mg by mouth every 12 (twelve) hours as needed for anxiety.  06/07/14   Blanchie Serve, MD  diazepam (VALIUM) 5 MG tablet Take 1 tablet (5 mg total) by mouth every 12 (twelve) hours as needed for anxiety. 01/30/15   Dustin Flock, MD  digoxin (LANOXIN) 0.125 MG tablet Take 0.125 mg by mouth daily.     Historical Provider, MD  diphenoxylate-atropine (LOMOTIL) 2.5-0.025 MG per tablet Take two tablets by mouth four times daily as needed for diarrhea Patient taking differently: Take 2 tablets by mouth 4 (four) times daily as needed for diarrhea or loose stools.  06/14/14   Estill Dooms, MD  FERREX 150 150 MG capsule TAKE 2 CAPSULES BY MOUTH EVERY DAY 01/16/15   Forest Gleason, MD  imatinib (GLEEVEC) 100 MG tablet Take 400 mg by mouth daily.    Historical Provider, MD  ipratropium-albuterol (DUONEB) 0.5-2.5 (3) MG/3ML SOLN Take 3 mLs by nebulization every 4 (four) hours as needed. 01/30/15  Dustin Flock, MD  latanoprost (XALATAN) 0.005 % ophthalmic solution Place 1 drop into both eyes at bedtime.    Historical Provider, MD  levalbuterol [facility] HFA) 45 MCG/ACT inhaler Inhale 2 puffs into the lungs every 4 (four) hours as needed for wheezing.    Historical Provider, MD  levalbuterol Penne Lash) 1.25 MG/3ML nebulizer solution Take 1.25 mg by nebulization every 6 (six) hours as needed for wheezing.    Historical Provider, MD   metolazone (ZAROXOLYN) 5 MG tablet Take 5 mg by mouth 2 (two) times a week. Pt takes on Tuesday and Friday.    Historical Provider, MD  montelukast (SINGULAIR) 10 MG tablet Take 10 mg by mouth at bedtime.    Historical Provider, MD  oxyCODONE (ROXICODONE) 5 MG immediate release tablet Take 1 tablet (5 mg total) by mouth every 8 (eight) hours as needed. 01/26/15 01/26/16  Earleen Newport, MD  oxyCODONE 10 MG TABS Take 0.5 tablets (5 mg total) by mouth every 6 (six) hours as needed for moderate pain. 01/30/15   Dustin Flock, MD  predniSONE (DELTASONE) 10 MG tablet Take 10 mg by mouth every other day.    Historical Provider, MD  theophylline (THEODUR) 200 MG 12 hr tablet Take 200 mg by mouth 2 (two) times daily with a meal.     Historical Provider, MD  torsemide (DEMADEX) 20 MG tablet Take 40 mg by mouth 2 (two) times a week. Pt takes on Tuesday and Friday.    Historical Provider, MD  traMADol Veatrice Bourbon) 50 MG tablet Take two tablets by mouth three times daily as needed for pain 01/30/15   Dustin Flock, MD  Vitamin D, Ergocalciferol, (DRISDOL) 50000 UNITS CAPS capsule Take 50,000 Units by mouth every 7 (seven) days.    Historical Provider, MD   Allergies  Allergen Reactions  . Ibuprofen Other (See Comments)    Pt states that she can't take because of her kidneys.    . Motrin Pm [Ibuprofen-Diphenhydramine Cit] Other (See Comments)    Pt states that she can't take because of her kidneys.    . Tylenol [Acetaminophen] Other (See Comments)    Pt states that she can't take because it is contraindicated with her Gleevec.      FAMILY HISTORY:  indicated that her mother is deceased. She indicated that her father is deceased. She indicated that her sister is deceased. She indicated that her brother is deceased.  SOCIAL HISTORY:  reports that she has never smoked. She has never used smokeless tobacco. She reports that she does not drink alcohol or use illicit drugs.  REVIEW OF SYSTEMS:  Unable    SUBJECTIVE: unresponsive on vent   VITAL SIGNS: Pulse Rate:  [66-73] 66 (10/08 1119) Resp:  [16-24] 16 (10/08 1119) BP: (100-127)/(55-97) 100/55 mmHg (10/08 1119) SpO2:  [84 %-98 %] 98 % (10/08 1119) FiO2 (%):  [100 %] 100 % (10/08 1119) HEMODYNAMICS:   VENTILATOR SETTINGS: Vent Mode:  [-] PRVC FiO2 (%):  [100 %] 100 % Set Rate:  [16 bmp] 16 bmp Vt Set:  [460 mL] 460 mL PEEP:  [5 cmH20] 5 cmH20 INTAKE / OUTPUT: No intake or output data in the 24 hours ending 03/02/2015 1140  PHYSICAL EXAMINATION: General:  Chronically ill appearing white female, unresponsive on vent  Neuro:  Unresponsive  HEENT:  Orally intubated  Cardiovascular:  rrr Lungs:  Scattered rhonchi  Abdomen:  Soft + bowel sound no OM  Musculoskeletal:  Intact  Skin:  No edema   LABS:  CBC  Recent Labs Lab 03/03/2015 1044 02/22/2015 1051  WBC  --  9.8  HGB 12.6 11.4*  HCT 37.0 36.1  PLT  --  158   Coag's No results for input(s): APTT, INR in the last 168 hours. BMET  Recent Labs Lab 02/18/2015 1044  NA 135  K 3.5  CL 91*  BUN 52*  CREATININE 2.00*  GLUCOSE 205*   Electrolytes No results for input(s): CALCIUM, MG, PHOS in the last 168 hours. Sepsis Markers  Recent Labs Lab 02/21/2015 1044  LATICACIDVEN 8.25*   ABG No results for input(s): PHART, PCO2ART, PO2ART in the last 168 hours. Liver Enzymes No results for input(s): AST, ALT, ALKPHOS, BILITOT, ALBUMIN in the last 168 hours. Cardiac Enzymes No results for input(s): TROPONINI, PROBNP in the last 168 hours. Glucose No results for input(s): GLUCAP in the last 168 hours.  Imaging Dg Chest Portable 1 View  02/17/2015   CLINICAL DATA:  Endotracheal tube placement.  EXAM: PORTABLE CHEST 1 VIEW  COMPARISON:  January 26, 2015.  FINDINGS: Stable cardiomegaly. Endotracheal tube is seen projected over tracheal air shadow with distal tip 2.5 cm above the carina. Nasogastric tube is seen entering stomach. No pneumothorax is noted. Severe  degenerative changes seen involving both glenohumeral joints. Left lung is clear. Mild central pulmonary vascular congestion is noted with possible perihilar edema.  IMPRESSION: Endotracheal tube in grossly good position. Mild central pulmonary vascular congestion is noted with possible perihilar edema.   Electronically Signed   By: Marijo Conception, M.D.   On: 02/05/2015 11:24     ASSESSMENT / PLAN:  PULMONARY OETT 10/8 A: Acute respiratory failure s/p cardiopulmonary arrest  R> L pulmonary infiltrates c/w edema  P:   Full vent support F/u abg  PAD protocol Will need lasix eventually   CARDIOVASCULAR CVL A:  Cardiac arrest  VT arrest Systolic CM  Severe CAD P:  Supportive care Tele Asa and medical rx Re-assess for diuresis  Not cc candidate   RENAL A:   AKI Lactic acidosis  P:   Cont IVFs Replace K I&O  GASTROINTESTINAL A:   No acute  P:   NPO for now PPI   HEMATOLOGIC A:   Chronic anemia  P:  Trend cbc [place] heparin  No role for xfusion   INFECTIOUS A:   No evidence of infection  P:   Trend fever curve   ENDOCRINE A:   Hyperglycemia  P:   ssi   NEUROLOGIC A:   Acute encephalopathy s/p cardiac arrest; almost certainly anoxic injury -->not cooling candidate  Remote CVA w/ left foot drop  P:   RASS goal: -2 PAD protocol  EEG  FAMILY  - Updates:   - Inter-disciplinary family meet or Palliative Care meeting due by:  10/15    TODAY'S SUMMARY: VF arrest. Now full DNR w/ medical rx. Down time assumed 20 minutes.  Get EEG, may be looking at w/d soon given d/w pt's daughter.     Erick Colace ACNP-BC [place] Pager # [phone] OR # [phone] if no answer   03/05/2015, 11:40 AM  STAFF NOTE: I, Merrie Roof, MD FACP have personally reviewed patient's available data, including medical history, events of note, physical examination and test results as part of my evaluation. I have discussed with resident/NP and  other care providers such as pharmacist, RN and RRT. In addition, I personally evaluated patient and elicited key findings of: unresponsive on vent post arrest 20-25 min VT,  likley cardiomyopathy known non ischemic giving rise to arrthymia, low threshold for amio drip, currently in fib rate 70, holding off for now per cards, MAP is adequate, extensive d/w daughter , d/w daughter We discussed patients current circumstances and organ failures recent  Hospital stay and cardiac history. We also discussed patient's prior wishes under circumstances such as this. Family has decided to NOT perform resuscitation if arrest, no cooling or temp management, no pressors, no line but to continue current medical support for now. If declines , myoclonus, seizures comfort, eeg now if able, pos balance concern with pulm edema on pcxr, follow repeat abg to determine vent needs / O2 needs as it relaTES TO LASIX NEEDS, D/W CARDIOLOGY AND EDP. Seems her functional status has declines of recent but was living in independent asst living just recent. No evidence infection.  The patient is critically ill with multiple organ systems failure and requires high complexity decision making for assessment and support, frequent evaluation and titration of therapies, application of advanced monitoring technologies and extensive interpretation of multiple databases.   Critical Care Time devoted to patient care services described in this note is 45 Minutes. This time reflects time of care of this signee: Merrie Roof, MD FACP. This critical care time does not reflect procedure time, or teaching time or supervisory time of PA/NP/Med student/Med Resident etc but could involve care discussion time. Rest per NP/medical resident whose note is outlined above and that I agree with   Lavon Paganini. Titus Mould, MD, [place] Pgr: [facility] 02/10/2015 12:26 PM

## 2015-02-11 NOTE — Progress Notes (Signed)
Spoke with Earnest Bailey, RN at [facility] concerning pt with low urine output 20cc since 1pm.  Bladder scan showed 75cc urine in bladder.  Foley flushed.  Earnest Bailey, RN to relay message to Dr. Oletta Darter.

## 2015-02-11 NOTE — Progress Notes (Signed)
Dr. Titus Mould notified of large amounts loose, bloody diarrhea as well as bleeding from mouth.   Orders received for flexiseal and to stop heparin and aspirin and check CBC q6h.  Per Dr. Titus Mould no need to test for cdiff.  Also notified Dr. Titus Mould of troponin being elevated.  Will continue to monitor.

## 2015-02-11 NOTE — ED Notes (Signed)
Anesthesia at bedside

## 2015-02-11 NOTE — Progress Notes (Signed)
eLink Physician-Brief Progress Note Patient Name: [name] DOB: Aug 21, 1933 MRN: [id number]   Date of Service  02/10/2015  HPI/Events of Note  Ventricular ectopy. K+ = 4.3. Patient is DNR with no escalation of care.   eICU Interventions  Will check a Mg++ level now.      Intervention Category Major Interventions: Arrhythmia - evaluation and management  [name] 02/05/2015, 3:18 PM

## 2015-02-11 NOTE — Progress Notes (Signed)
eLink Physician-Brief Progress Note Patient Name: [name] DOB: 1933/12/30 MRN: [id number]   Date of Service  02/21/2015  HPI/Events of Note  Oliguria.   eICU Interventions  Will order: 1. 0.9 NaCl IV bolus now.  2. Bladder scan now. 3. If she makes any urine will send labs to calculate FeNa.     Intervention Category Intermediate Interventions: Oliguria - evaluation and management  Sommer,Steven Eugene 02/08/2015, 10:33 PM

## 2015-02-11 NOTE — ED Provider Notes (Signed)
CSN: [id number]     Arrival date & time 02/05/2015  1025 History   First MD Initiated Contact with Patient 02/21/2015 1057     Chief Complaint  Patient presents with  . Code STEMI     (Consider location/radiation/quality/duration/timing/severity/associated sxs/prior Treatment) HPI L5 caveat patient unresponsive. Acuity of situation. Patient was found unresponsive by nursing home staff. EMS was called. EMS found patient to be in ventricular fibrillation. EMS defibrillated patient 6 times. Administered intraosseous access. Patient was administered amiodarone 300 mg by intraosseous line. She was intubated by [facility] airway. She arrived here with peripheral pulses present, unresponsive. Code STEMI call in the field by EMS Past Medical History  Diagnosis Date  . Asthma   . Anxiety   . Hypertension   . Hyperlipidemia   . CVA (cerebral infarction)   . Paroxysmal a-fib ([place])   . Renal insufficiency   . COPD (chronic obstructive pulmonary disease) ([place])   . Mitral valve insufficiency   . CHF (congestive heart failure) ([place])   . Stroke ([place])   . Left foot drop   . Nontraumatic partial bilateral rotator cuff tear   . Cancer ([place])     lung   Past Surgical History  Procedure Laterality Date  . Back surgery    . Joint replacement    . Liver resection    . Radiation implants to liver    . Lung nodule resection     Family History  Problem Relation Age of Onset  . Congestive Heart Failure Mother   . Lung cancer Father   . Ovarian cancer Sister   . Throat cancer Brother    Social History  Substance Use Topics  . Smoking status: Never Smoker   . Smokeless tobacco: Never Used  . Alcohol Use: No   OB History    No data available     Review of Systems  Unable to perform ROS: Acuity of condition      Allergies  Ibuprofen; Motrin pm; and Tylenol  Home Medications   Prior to Admission medications   Medication Sig Start Date End Date Taking? Authorizing Provider  aspirin EC 325 MG  tablet Take 325 mg by mouth daily.    Historical Provider, MD  baclofen (LIORESAL) 10 MG tablet Take 1 tablet by mouth 2 (two) times daily as needed. 01/19/15   Historical Provider, MD  brimonidine (ALPHAGAN) 0.15 % ophthalmic solution Place 1 drop into both eyes 2 (two) times daily.    Historical Provider, MD  brinzolamide (AZOPT) 1 % ophthalmic suspension Place 1 drop into both eyes 2 (two) times daily.    Historical Provider, MD  budesonide-formoterol (SYMBICORT) 160-4.5 MCG/ACT inhaler Inhale 2 puffs into the lungs 2 (two) times daily.    Historical Provider, MD  cephALEXin (KEFLEX) 250 MG capsule Take 1 capsule (250 mg total) by mouth every 8 (eight) hours. 01/30/15   Dustin Flock, MD  diazepam (VALIUM) 5 MG tablet Take one tablet by mouth every 12 hours as needed for anxiety/nervousness. Patient taking differently: Take 5 mg by mouth every 12 (twelve) hours as needed for anxiety.  06/07/14   Blanchie Serve, MD  diazepam (VALIUM) 5 MG tablet Take 1 tablet (5 mg total) by mouth every 12 (twelve) hours as needed for anxiety. 01/30/15   Dustin Flock, MD  digoxin (LANOXIN) 0.125 MG tablet Take 0.125 mg by mouth daily.     Historical Provider, MD  diphenoxylate-atropine (LOMOTIL) 2.5-0.025 MG per tablet Take two tablets by mouth four times daily  as needed for diarrhea Patient taking differently: Take 2 tablets by mouth 4 (four) times daily as needed for diarrhea or loose stools.  06/14/14   Estill Dooms, MD  FERREX 150 150 MG capsule TAKE 2 CAPSULES BY MOUTH EVERY DAY 01/16/15   Forest Gleason, MD  imatinib (GLEEVEC) 100 MG tablet Take 400 mg by mouth daily.    Historical Provider, MD  ipratropium-albuterol (DUONEB) 0.5-2.5 (3) MG/3ML SOLN Take 3 mLs by nebulization every 4 (four) hours as needed. 01/30/15   Dustin Flock, MD  latanoprost (XALATAN) 0.005 % ophthalmic solution Place 1 drop into both eyes at bedtime.    Historical Provider, MD  levalbuterol [facility] HFA) 45 MCG/ACT inhaler Inhale 2 puffs  into the lungs every 4 (four) hours as needed for wheezing.    Historical Provider, MD  levalbuterol Penne Lash) 1.25 MG/3ML nebulizer solution Take 1.25 mg by nebulization every 6 (six) hours as needed for wheezing.    Historical Provider, MD  metolazone (ZAROXOLYN) 5 MG tablet Take 5 mg by mouth 2 (two) times a week. Pt takes on Tuesday and Friday.    Historical Provider, MD  montelukast (SINGULAIR) 10 MG tablet Take 10 mg by mouth at bedtime.    Historical Provider, MD  oxyCODONE (ROXICODONE) 5 MG immediate release tablet Take 1 tablet (5 mg total) by mouth every 8 (eight) hours as needed. 01/26/15 01/26/16  Earleen Newport, MD  oxyCODONE 10 MG TABS Take 0.5 tablets (5 mg total) by mouth every 6 (six) hours as needed for moderate pain. 01/30/15   Dustin Flock, MD  predniSONE (DELTASONE) 10 MG tablet Take 10 mg by mouth every other day.    Historical Provider, MD  theophylline (THEODUR) 200 MG 12 hr tablet Take 200 mg by mouth 2 (two) times daily with a meal.     Historical Provider, MD  torsemide (DEMADEX) 20 MG tablet Take 40 mg by mouth 2 (two) times a week. Pt takes on Tuesday and Friday.    Historical Provider, MD  traMADol Veatrice Bourbon) 50 MG tablet Take two tablets by mouth three times daily as needed for pain 01/30/15   Dustin Flock, MD  Vitamin D, Ergocalciferol, (DRISDOL) 50000 UNITS CAPS capsule Take 50,000 Units by mouth every 7 (seven) days.    Historical Provider, MD   BP 127/81 mmHg  Pulse 73  Resp 18  SpO2 84%  LMP  (Exact Date) Physical Exam  Constitutional:  Unresponsive Glasgow Coma Score 3  HENT:  Head: Normocephalic and atraumatic.  Right Ear: External ear normal.  Left Ear: External ear normal.  King airway in place with bloody secretions and tube  Eyes: Pupils are equal, round, and reactive to light.  Neck: Neck supple. No tracheal deviation present. No thyromegaly present.  Cardiovascular: Normal rate and regular rhythm.   Pulmonary/Chest: Effort normal.  Agonal  spontaneous respirations  Abdominal: Soft. She exhibits no distension. There is no tenderness.  Musculoskeletal: Normal range of motion. She exhibits no edema or tenderness.  Neurological:  Glasgow Coma Score 3  Skin: Skin is warm and dry.  Psychiatric: She has a normal mood and affect.  Nursing note and vitals reviewed.   ED Course  Procedures (including critical care time) Labs Review Labs Reviewed  CBC - Abnormal; Notable for the following:    Hemoglobin 11.4 (*)    All other components within normal limits  I-STAT CHEM 8, ED - Abnormal; Notable for the following:    Chloride 91 (*)    BUN  52 (*)    Creatinine, Ser 2.00 (*)    Glucose, Bld 205 (*)    Calcium, Ion 1.11 (*)    All other components within normal limits  I-STAT TROPOININ, ED - Abnormal; Notable for the following:    Troponin i, poc 1.38 (*)    All other components within normal limits  I-STAT CG4 LACTIC ACID, ED - Abnormal; Notable for the following:    Lactic Acid, Venous 8.25 (*)    All other components within normal limits  DIFFERENTIAL  PROTIME-INR  APTT  BASIC METABOLIC PANEL  TROPONIN I  I-STAT CHEM 8, ED    Imaging Review No results found. I have personally reviewed and evaluated these images and lab results as part of my medical decision-making.   EKG Interpretation   Date/Time:  Saturday February 11 2015 10:28:31 EDT Ventricular Rate:  64 PR Interval:    QRS Duration: 142 QT Interval:  484 QTC Calculation: 499 R Axis:   -72 Text Interpretation:  Junctional rhythm IVCD, consider atypical RBBB  Inferior infarct, acute (RCA) Probable RV involvement, suggest recording  right precordial leads New since previous tracing Confirmed by Winfred Leeds   MD, Ruben Pyka [phone]) on 02/19/2015 11:11:26 AM      Patient was extubated by me. Her respirations assisted with bag valve mask. I attempted to reintubate patient with direct laryngoscopy using a boigie, without success. Anesthesia service was called and  successfully intubated patient using glide scope.  Critical care service was consulted and Dr. Titus Mould evaluated patient in NAD. Her groin was packed with ice packs and she was given ice cool saline prior to critical care service arrival. Cardiology was consulted, and Dr. branch came to evaluate patient. It was decided that she would not be taken emergently to the Cath Lab Results for orders placed or performed during the hospital encounter of 02/25/2015  CBC  Result Value Ref Range   WBC 9.8 4.0 - 10.5 K/uL   RBC 4.01 3.87 - 5.11 MIL/uL   Hemoglobin 11.4 (L) 12.0 - 15.0 g/dL   HCT 36.1 36.0 - 46.0 %   MCV 90.0 78.0 - 100.0 fL   MCH 28.4 26.0 - 34.0 pg   MCHC 31.6 30.0 - 36.0 g/dL   RDW 12.7 11.5 - 15.5 %   Platelets 158 150 - 400 K/uL  Differential  Result Value Ref Range   Neutrophils Relative % 49 %   Lymphocytes Relative 46 %   Monocytes Relative 4 %   Eosinophils Relative 1 %   Basophils Relative 0 %   Neutro Abs 4.8 1.7 - 7.7 K/uL   Lymphs Abs 4.5 (H) 0.7 - 4.0 K/uL   Monocytes Absolute 0.4 0.1 - 1.0 K/uL   Eosinophils Absolute 0.1 0.0 - 0.7 K/uL   Basophils Absolute 0.0 0.0 - 0.1 K/uL   WBC Morphology MILD LEFT SHIFT (1-5% METAS, OCC MYELO, OCC BANDS)   I-stat chem 8, ed  Result Value Ref Range   Sodium 135 135 - 145 mmol/L   Potassium 3.5 3.5 - 5.1 mmol/L   Chloride 91 (L) 101 - 111 mmol/L   BUN 52 (H) 6 - 20 mg/dL   Creatinine, Ser 2.00 (H) 0.44 - 1.00 mg/dL   Glucose, Bld 205 (H) 65 - 99 mg/dL   Calcium, Ion 1.11 (L) 1.13 - 1.30 mmol/L   TCO2 27 0 - 100 mmol/L   Hemoglobin 12.6 12.0 - 15.0 g/dL   HCT 37.0 36.0 - 46.0 %  I-stat troponin, ED  (  not at [facility])  Result Value Ref Range   Troponin i, poc 1.38 (HH) 0.00 - 0.08 ng/mL   Comment NOTIFIED PHYSICIAN    Comment 3          I-Stat CG4 Lactic Acid, ED  Result Value Ref Range   Lactic Acid, Venous 8.25 (HH) 0.5 - 2.0 mmol/L   Comment NOTIFIED PHYSICIAN    Dg Chest 2 View  01/26/2015   CLINICAL DATA:   Dyspnea after fall.  EXAM: CHEST  2 VIEW  COMPARISON:  December 21, 2014.  FINDINGS: Stable cardiomegaly. No pneumothorax or pleural effusion is noted. No acute pulmonary disease is noted. Severe degenerative changes seen involving the right glenohumeral joint.  IMPRESSION: Stable cardiomegaly.  No acute pulmonary disease is noted.   Electronically Signed   By: Marijo Conception, M.D.   On: 01/26/2015 10:19   Dg Lumbar Spine 2-3 Views  01/26/2015   CLINICAL DATA:  [facility] nursing home patient. Fall while walking 2 bathroom with walker. Low back pain and sacral pain.  EXAM: LUMBAR SPINE - 2-3 VIEW  COMPARISON:  CT 11/14/2014  FINDINGS: Essentially normal alignment of the lumbar vertebral bodies. There is joint space widening at L3-L4 similar to comparison CT. There is joint space narrowing at L1-L2, L2-L3 and L4-L5. There is mild anterolisthesis of L3 on L4 by 5 mm which is also unchanged from comparison CT. On the frontal projection there is mild dextroscoliosis.  IMPRESSION: Multiple levels of degenerative change in the lumbar spine are similar to comparison CT of 11/14/2014. No acute findings.   Electronically Signed   By: Suzy Bouchard M.D.   On: 01/26/2015 10:21   Dg Sacrum/coccyx  01/26/2015   CLINICAL DATA:  Dyspnea, fall, low back pain  EXAM: SACRUM AND COCCYX - 2+ VIEW  COMPARISON:  None.  FINDINGS: There is generalized osteopenia. The sacrum is limited evaluation secondary to overlying bowel gas. No gross sacral fracture.  Right total hip arthroplasty. Moderate osteoarthritis of the left hip. Mild degenerative changes of bilateral sacroiliac joints. Degenerative disc disease at L4-5 and L5-S1 with bilateral facet arthropathy.  There is peripheral vascular atherosclerotic disease.  IMPRESSION: No acute osseous injury of the of the sacrum.   Electronically Signed   By: Kathreen Devoid   On: 01/26/2015 10:20   US Venous Img Lower Bilateral  01/27/2015   CLINICAL DATA:  Bilateral lower extremity  swelling x1 month  EXAM: BILATERAL LOWER EXTREMITY VENOUS DOPPLER ULTRASOUND  TECHNIQUE: Gray-scale sonography with compression, as well as color and duplex ultrasound, were performed to evaluate the deep venous system from the level of the common femoral vein through the popliteal and proximal calf veins.  COMPARISON:  12/21/2014 and earlier studies  FINDINGS: Normal compressibility of the common femoral, superficial femoral, and popliteal veins, as well as the proximal calf veins. No filling defects to suggest DVT on grayscale or color Doppler imaging. Doppler waveforms show normal direction of venous flow, normal respiratory phasicity and response to augmentation. Scattered arterial calcifications are noted in both lower extremities. Visualized segments of the saphenous venous system normal in caliber and compressibility.  IMPRESSION: 1. No evidence of lower extremity deep vein thrombosis, BILATERALLY.   Electronically Signed   By: Lucrezia Europe M.D.   On: 01/27/2015 14:51   Dg Chest Portable 1 View  03/04/2015   CLINICAL DATA:  Endotracheal tube placement.  EXAM: PORTABLE CHEST 1 VIEW  COMPARISON:  January 26, 2015.  FINDINGS: Stable cardiomegaly. Endotracheal tube  is seen projected over tracheal air shadow with distal tip 2.5 cm above the carina. Nasogastric tube is seen entering stomach. No pneumothorax is noted. Severe degenerative changes seen involving both glenohumeral joints. Left lung is clear. Mild central pulmonary vascular congestion is noted with possible perihilar edema.  IMPRESSION: Endotracheal tube in grossly good position. Mild central pulmonary vascular congestion is noted with possible perihilar edema.   Electronically Signed   By: Marijo Conception, M.D.   On: 03/04/2015 11:24    MDM  Dr. Titus Mould spoke with family and decided the patient would not be subject to hypothermia protocol. She will be admitted to the intensive care unit. Final diagnoses:  None  Dx #1 cardiac  arrest #2STEMI #3 lactic acidosis #4renal insufficency CRITICAL CARE Performed by: Orlie Dakin Total critical care time: 40 minutes Critical care time was exclusive of separately billable procedures and treating other patients. Critical care was necessary to treat or prevent imminent or life-threatening deterioration. Critical care was time spent personally by me on the following activities: development of treatment plan with patient and/or surrogate as well as nursing, discussions with consultants, evaluation of patient's response to treatment, examination of patient, obtaining history from patient or surrogate, ordering and performing treatments and interventions, ordering and review of laboratory studies, ordering and review of radiographic studies, pulse oximetry and re-evaluation of patient's condition.      Orlie Dakin, MD 02/06/2015 1146

## 2015-02-11 NOTE — ED Notes (Signed)
Anesthesia paged for difficult airway.

## 2015-02-11 NOTE — Progress Notes (Signed)
Dr Sommer([facility]) notified of patient having no urinary output since 2:00pm. New orders obtained.

## 2015-02-11 NOTE — ED Notes (Signed)
Ice to groin

## 2015-02-11 NOTE — Progress Notes (Signed)
Vent rate changed to 24 post Venous gas- per Dr Titus Mould.  Per MD, no other vent changes presently.

## 2015-02-11 NOTE — ED Notes (Addendum)
Pt here via [place] from [facility].  Staff were assisting pt in shower when she became unresponsive and pulseless.  CPR initiated immediately.  Fire arrived and shocked pt twice.  When EMS arrived pt was in V-fib.  Pt shocked 5 more times and give a total of 5 more shocks, 6 epi's and 300 mg amiodarone.  CBG 159.  Pt NSR upon arrival.

## 2015-02-11 NOTE — Progress Notes (Signed)
02/15/2015 1100  Clinical Encounter Type  Visited With Family;Patient and family together;Health care provider  Visit Type Initial;ED;Critical Care  Referral From Nurse  Spiritual Encounters  Spiritual Needs Prayer;Emotional;Grief support  Stress Factors  Family Stress Factors Major life changes;Health changes  [place] paged initially for Code Stemi; Pt then to Critical Care team; [facility] met family and brought to Consul B; [facility] available for emotional and grief support; [facility] liaison with family and [facility] team on visit with pt; pt in process to being admitted to [facility]; [facility] will escort family to [facility] waiting area. 11:41 AM Gwynn Burly

## 2015-02-11 NOTE — ED Notes (Signed)
Pt opening eyes.  Dr Titus Mould called for sedation orders.

## 2015-02-11 NOTE — ED Notes (Signed)
Dr Wyvonna Plum spoke with family and they stated no aggressive tx including no code cool.  Family at bedside.

## 2015-02-11 NOTE — Progress Notes (Signed)
Dr. Oletta Darter notified of pt continuing to have large amounts of ectopy and runs of VT.  Orders received to check magnesium.  Pt is DNR and family does not wish to escalate care.  Also notified Dr. Oletta Darter that we have been unable to pick up satisfactory O2 sats and pt temp low at 32.6 degrees C core and warming blanket applied.  Dr. Oletta Darter reviewed lab results.  Will continue to monitor.

## 2015-02-11 NOTE — ED Notes (Signed)
Attempted report 

## 2015-02-11 NOTE — Consult Note (Signed)
Primary cardiologist: Dr Bunnie Philips Consulting cardiologist: Dr Carlyle Dolly MD  Clinical Summary [name] is a 81 y.o.female history of chronic systolic HF (echo 0/6301 LVEF 25-30%), family reports several year history of low LVEF. She was followed initially by Dr Clayborn Bigness at [facility]. Family reports a cath about 2 years ago that was normal. She also has history of PAF, HTN, CKD, MR, TR admitted with cardiac arrest from her nursing home. History is indirect but apparently she had VF arrest requiring 6 defibrillations as well as multiple rounds of epi. The exact time she is down is unclear by given the number of reported shocks would suggest around 20 minutes. She was intubated in the ER, at this time junctional rhythm with her chronic ventricular conduction delay.    Trop 1.38, lactic acid 8.25, K 3.5, Cr 2 (baseline 1.5-1.6), Hgb 11.4 Plt 158,  EKG junctional rhythm, nonspecific ST elevation inferior leads in setting of her conduction delay and Qwaves.  CXR pending    Allergies  Allergen Reactions  . Ibuprofen Other (See Comments)    Pt states that she can't take because of her kidneys.    . Motrin Pm [Ibuprofen-Diphenhydramine Cit] Other (See Comments)    Pt states that she can't take because of her kidneys.    . Tylenol [Acetaminophen] Other (See Comments)    Pt states that she can't take because it is contraindicated with her Gleevec.      Medications Scheduled Medications: . aspirin  324 mg Oral Once  . heparin  4,000 Units Intravenous Once     Infusions:     PRN Medications:  etomidate, succinylcholine   Past Medical History  Diagnosis Date  . Asthma   . Anxiety   . Hypertension   . Hyperlipidemia   . CVA (cerebral infarction)   . Paroxysmal a-fib ([place])   . Renal insufficiency   . COPD (chronic obstructive pulmonary disease) ([place])   . Mitral valve insufficiency   . CHF (congestive heart failure) ([place])   . Stroke ([place])   . Left foot  drop   . Nontraumatic partial bilateral rotator cuff tear   . Cancer ([place])     lung    Past Surgical History  Procedure Laterality Date  . Back surgery    . Joint replacement    . Liver resection    . Radiation implants to liver    . Lung nodule resection      Family History  Problem Relation Age of Onset  . Congestive Heart Failure Mother   . Lung cancer Father   . Ovarian cancer Sister   . Throat cancer Brother     Social History [name] reports that she has never smoked. She has never used smokeless tobacco. [name] reports that she does not drink alcohol.  Review of Systems CONSTITUTIONAL: No weight loss, fever, chills, weakness or fatigue.  HEENT: Eyes: No visual loss, blurred vision, double vision or yellow sclerae. No hearing loss, sneezing, congestion, runny nose or sore throat.  SKIN: No rash or itching.  CARDIOVASCULAR: No chest pain, chest pressure or chest discomfort. No palpitations or edema.  RESPIRATORY: No shortness of breath, cough or sputum.  GASTROINTESTINAL: No anorexia, nausea, vomiting or diarrhea. No abdominal pain or blood.  GENITOURINARY: no polyuria, no dysuria NEUROLOGICAL: No headache, dizziness, syncope, paralysis, ataxia, numbness or tingling in the extremities. No change in bowel or bladder control.  MUSCULOSKELETAL: No muscle, back pain, joint pain or stiffness.  HEMATOLOGIC: No anemia, bleeding or bruising.  LYMPHATICS: No enlarged nodes. No history of splenectomy.  PSYCHIATRIC: No history of depression or anxiety.      Physical Examination Blood pressure 127/81, pulse 73, resp. rate 18, SpO2 84 %. No intake or output data in the 24 hours ending 03/03/2015 1119  HEENT: sclera clear  Cardiovascular: RRR, 3/6 systolic murmur at apex  Respiratory: coarse bilaterally  GI: abdomen soft, NT, ND  MSK: no LE edema  Neuro: sedated, nonresponsive  Psych: cannot assess   Lab Results  Basic Metabolic Panel:  Recent Labs Lab  03/05/2015 1044  NA 135  K 3.5  CL 91*  GLUCOSE 205*  BUN 52*  CREATININE 2.00*    Liver Function Tests: No results for input(s): AST, ALT, ALKPHOS, BILITOT, PROT, ALBUMIN in the last 168 hours.  CBC:  Recent Labs Lab 02/20/2015 1044 03/04/2015 1051  WBC  --  9.8  NEUTROABS  --  PENDING  HGB 12.6 11.4*  HCT 37.0 36.1  MCV  --  90.0  PLT  --  158    Cardiac Enzymes: No results for input(s): CKTOTAL, CKMB, CKMBINDEX, TROPONINI in the last 168 hours.  BNP: Invalid input(s): POCBNP   ECG   Imaging   Impression/Recommendations 1. Cardiac arrest - reported VF in the field, required 6 defibrillations - currently in stable junctional rhythm with her underlying conduction delay, nonspecific ST elevation in inferior leads in setting of conduction delay.  - patient with overall poor functional capacity, prolonged pulseless arrest. Unclear if arrhtymia due to her chronic LV dysfunction or acute process. Discussed case with ICU team and interventional cardiology Dr Tamala Julian. Will manage her medically at this time, will not proceed with cath lab as we collect more date and follow the recovery of her functional status s/p prolonged code. She remains hemodynamically stable at this time with stable rhythm - if recurrent ventricular arrhythmia would start amiodarone, would not start at this time given stable rhythm and the fact she is junctional rhythm - repeat echo once stabilized     Carlyle Dolly, M.D.

## 2015-02-11 NOTE — ED Notes (Signed)
O2 saturation readings are not accurate.  Capnography reading is 27-30

## 2015-02-11 NOTE — ED Notes (Signed)
King airway removed.

## 2015-02-12 ENCOUNTER — Inpatient Hospital Stay (HOSPITAL_COMMUNITY): Payer: Medicare Other

## 2015-02-12 DIAGNOSIS — I2510 Atherosclerotic heart disease of native coronary artery without angina pectoris: Secondary | ICD-10-CM

## 2015-02-12 DIAGNOSIS — I2583 Coronary atherosclerosis due to lipid rich plaque: Secondary | ICD-10-CM

## 2015-02-12 DIAGNOSIS — J690 Pneumonitis due to inhalation of food and vomit: Secondary | ICD-10-CM | POA: Insufficient documentation

## 2015-02-12 DIAGNOSIS — G9340 Encephalopathy, unspecified: Secondary | ICD-10-CM

## 2015-02-12 DIAGNOSIS — I5023 Acute on chronic systolic (congestive) heart failure: Secondary | ICD-10-CM

## 2015-02-12 DIAGNOSIS — N19 Unspecified kidney failure: Secondary | ICD-10-CM

## 2015-02-12 LAB — BASIC METABOLIC PANEL
ANION GAP: 25 — AB (ref 5–15)
BUN: 64 mg/dL — ABNORMAL HIGH (ref 6–20)
CHLORIDE: 94 mmol/L — AB (ref 101–111)
CO2: 20 mmol/L — AB (ref 22–32)
Calcium: 8.4 mg/dL — ABNORMAL LOW (ref 8.9–10.3)
Creatinine, Ser: 2.97 mg/dL — ABNORMAL HIGH (ref 0.44–1.00)
GFR calc non Af Amer: 14 mL/min — ABNORMAL LOW (ref >60)
GFR, EST AFRICAN AMERICAN: 16 mL/min — AB (ref >60)
Glucose, Bld: 58 mg/dL — ABNORMAL LOW (ref 65–99)
Potassium: 3.8 mmol/L (ref 3.5–5.1)
Sodium: 139 mmol/L (ref 135–145)

## 2015-02-12 LAB — BLOOD GAS, ARTERIAL
Acid-base deficit: 2.3 mmol/L — ABNORMAL HIGH (ref 0.0–2.0)
Bicarbonate: 20.5 mEq/L (ref 20.0–24.0)
DRAWN BY: 245131
FIO2: 60
MECHVT: 460 mL
O2 Saturation: 99.1 %
PEEP: 5 cmH2O
PH ART: 7.477 — AB (ref 7.350–7.450)
PO2 ART: 139 mmHg — AB (ref 80.0–100.0)
Patient temperature: 101
RATE: 24 resp/min
TCO2: 21.3 mmol/L (ref 0–100)
pCO2 arterial: 28.4 mmHg — ABNORMAL LOW (ref 35.0–45.0)

## 2015-02-12 LAB — GLUCOSE, CAPILLARY
GLUCOSE-CAPILLARY: 61 mg/dL — AB (ref 65–99)
GLUCOSE-CAPILLARY: 81 mg/dL (ref 65–99)
GLUCOSE-CAPILLARY: 51 mg/dL — AB (ref 65–99)
Glucose-Capillary: 66 mg/dL (ref 65–99)
Glucose-Capillary: 81 mg/dL (ref 65–99)
Glucose-Capillary: 94 mg/dL (ref 65–99)
Glucose-Capillary: 99 mg/dL (ref 65–99)

## 2015-02-12 LAB — CBC
HCT: 26.3 % — ABNORMAL LOW (ref 36.0–46.0)
HCT: 24 % — ABNORMAL LOW (ref 36.0–46.0)
Hemoglobin: 9 g/dL — ABNORMAL LOW (ref 12.0–15.0)
Hemoglobin: 7.8 g/dL — ABNORMAL LOW (ref 12.0–15.0)
MCH: 28.8 pg (ref 26.0–34.0)
MCH: 28.5 pg (ref 26.0–34.0)
MCHC: 34.2 g/dL (ref 30.0–36.0)
MCHC: 32.5 g/dL (ref 30.0–36.0)
MCV: 84.3 fL (ref 78.0–100.0)
MCV: 87.6 fL (ref 78.0–100.0)
PLATELETS: 178 10*3/uL (ref 150–400)
PLATELETS: 154 10*3/uL (ref 150–400)
RBC: 3.12 MIL/uL — ABNORMAL LOW (ref 3.87–5.11)
RBC: 2.74 MIL/uL — AB (ref 3.87–5.11)
RDW: 12.7 % (ref 11.5–15.5)
RDW: 13 % (ref 11.5–15.5)
WBC: 15.3 10*3/uL — ABNORMAL HIGH (ref 4.0–10.5)
WBC: 10.9 10*3/uL — AB (ref 4.0–10.5)

## 2015-02-12 LAB — PHOSPHORUS: PHOSPHORUS: 5.6 mg/dL — AB (ref 2.5–4.6)

## 2015-02-12 LAB — MAGNESIUM: Magnesium: 2.4 mg/dL (ref 1.7–2.4)

## 2015-02-12 MED ORDER — DEXTROSE 50 % IV SOLN
INTRAVENOUS | Status: AC
  Administered 2015-02-12: 25 mL
  Filled 2015-02-12: qty 50

## 2015-02-12 MED ORDER — DEXTROSE 50 % IV SOLN
INTRAVENOUS | Status: AC
  Administered 2015-02-12: 50 mL via INTRAVENOUS
  Filled 2015-02-12: qty 50

## 2015-02-12 MED ORDER — LACTATED RINGERS IV SOLN: INTRAVENOUS | Status: DC

## 2015-02-12 MED ORDER — DEXTROSE 50 % IV SOLN
25.0000 mL | Freq: Once | INTRAVENOUS | Status: AC
  Administered 2015-02-12: 25 mL via INTRAVENOUS

## 2015-02-12 MED ORDER — SODIUM CHLORIDE 0.9 % IV SOLN
1.5000 g | Freq: Two times a day (BID) | INTRAVENOUS | Status: DC
  Filled 2015-02-12 (×2): qty 1.5

## 2015-02-12 MED ORDER — DEXTROSE 50 % IV SOLN
1.0000 | Freq: Once | INTRAVENOUS | Status: AC
  Administered 2015-02-12: 50 mL via INTRAVENOUS

## 2015-02-12 MED ORDER — SODIUM CHLORIDE 0.9 % IV BOLUS (SEPSIS): 750.0000 mL | Freq: Once | INTRAVENOUS | Status: DC

## 2015-02-12 MED ORDER — DEXTROSE-NACL 5-0.9 % IV SOLN
INTRAVENOUS | Status: DC
  Administered 2015-02-12: 06:00:00 via INTRAVENOUS

## 2015-02-12 MED ORDER — VITAL HIGH PROTEIN PO LIQD
1000.0000 mL | ORAL | Status: DC
  Filled 2015-02-12 (×2): qty 1000

## 2015-02-12 MED FILL — Medication: Qty: 1 | Status: AC

## 2015-02-13 ENCOUNTER — Ambulatory Visit: Payer: Medicare Other | Admitting: Family

## 2015-02-15 ENCOUNTER — Telehealth: Payer: Self-pay

## 2015-02-15 NOTE — Telephone Encounter (Signed)
On 02/15/2015 I received a death certificate from [employer]. The death certificate is for cremation. The patient is a patient of Doctor Titus Mould. The death certificate will be taken to [facility]) for signature this am. On 26-Feb-2015 I received the death certificate back from Doctor Titus Mould. I got the death certificate ready for pickup and called the funeral home to let them know the death certificate is ready for pickup.

## 2015-02-16 NOTE — Telephone Encounter (Signed)
This note was entered by mistake. °

## 2015-03-07 NOTE — Progress Notes (Signed)
Dr Deterding notified of patient experiencing episodes of hypoglycemia as well as persists to have no urinary output. New orders obtained.

## 2015-03-07 NOTE — Consult Note (Signed)
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 Wound bed: red, moist with white/scarred epibole (rolled wound edges) circumferentially. Drainage (amount, consistency, odor) small serous, no odor Periwound: Dry with evidence of previous wound healing (scar, contraction) without return of pigmentation Dressing procedure/placement/frequency: I will provide Nursing with orders for the filling of the defect with a calcium alginate dressing to absorb exudate and provide a continually moist wound healing environment, and a cover dressing that will disperse pressure. Patient will continue to be turned and repositioned from side to side and the supine position will be avoided. Daughters (2) in room at the time of my assessment and participate in the developement of the POC. A healed pressure injury on the left heel will be protected by the provision of bilateral pressure redistribution heel boots (Prevalon).  While the heels are currently being floated using pillows, the family prefers the provision of a boot. Skin care products (cleanser, moisturizer and moisture barrier) are at bedside for care of the integumentary system following episodes of leakage from around the indwelling bowel management system. Noted is a blister on the tongue that occurred during intubation yesterday.  Staff are keeping this area moist and Respiratory Therapy, Nursing and MD will continue to monitor. [place] nursing team will not follow, but will remain available to this patient, the nursing and medical teams.  Please re-consult if needed. Thanks, Maudie Flakes, MSN, RN, [place], [place], [place] [phone])

## 2015-03-07 NOTE — Progress Notes (Signed)
ANTIBIOTIC CONSULT NOTE - INITIAL  Pharmacy Consult for Unasyn Indication: aspiration pneumonia  Allergies  Allergen Reactions  . Ibuprofen Other (See Comments)    Pt states that she can't take because of her kidneys.    . Motrin Pm [Ibuprofen-Diphenhydramine Cit] Other (See Comments)    Pt states that she can't take because of her kidneys.    . Tylenol [Acetaminophen] Other (See Comments)    Pt states that she can't take because it is contraindicated with her Gleevec.      Patient Measurements: Height: 5\' 8"  (172.7 cm) Weight: 133 lb 6.1 oz (60.5 kg) IBW/kg (Calculated) : 63.9  Vital Signs: Temp: 99.5 F (37.5 C) (10/09 0800) BP: 74/56 mmHg (10/09 0800) Pulse Rate: 104 (10/09 0926) Intake/Output from previous day: 10/08 0701 - 10/09 0700 In: 2673.3 [I.V.:2173.3; IV Piggyback:500] Out: 1985 [Urine:110; Emesis/NG output:300; RSWNI:6270] Intake/Output from this shift:    Labs:  Recent Labs  02/17/2015 1108 03/03/2015 1121 02/22/2015 1504 02/21/2015 1955 February 16, 2015 0345  WBC 16.0*  --  34.0* 30.8* 15.3*  HGB 10.1*  --  10.5* 10.5* 9.0*  PLT 194  --  185 191 178  CREATININE 2.30* 2.27*  --   --  2.97*   Estimated Creatinine Clearance: 14.7 mL/min (by C-G formula based on Cr of 2.97). No results for input(s): VANCOTROUGH, VANCOPEAK, VANCORANDOM, GENTTROUGH, GENTPEAK, GENTRANDOM, TOBRATROUGH, TOBRAPEAK, TOBRARND, AMIKACINPEAK, AMIKACINTROU, AMIKACIN in the last 72 hours.   Microbiology: Recent Results (from the past 720 hour(s))  Urine culture     Status: None   Collection Time: 01/26/15  2:25 PM  Result Value Ref Range Status   Specimen Description URINE, RANDOM  Final   Special Requests Normal  Final   Culture   Final    >=100,000 COLONIES/mL ESCHERICHIA COLI >=100,000 COLONIES/mL KLEBSIELLA PNEUMONIAE    Report Status 01/29/2015 FINAL  Final   Organism ID, Bacteria ESCHERICHIA COLI  Final   Organism ID, Bacteria KLEBSIELLA PNEUMONIAE  Final      Susceptibility   Escherichia coli - MIC*    AMPICILLIN 4 SENSITIVE Sensitive     CEFTAZIDIME <=1 SENSITIVE Sensitive     CEFAZOLIN <=4 SENSITIVE Sensitive     CEFTRIAXONE <=1 SENSITIVE Sensitive     CIPROFLOXACIN <=0.25 SENSITIVE Sensitive     GENTAMICIN <=1 SENSITIVE Sensitive     IMIPENEM <=0.25 SENSITIVE Sensitive     TRIMETH/SULFA <=20 SENSITIVE Sensitive     NITROFURANTOIN Value in next row Sensitive      SENSITIVE<=16    PIP/TAZO Value in next row Sensitive      SENSITIVE<=4    LEVOFLOXACIN Value in next row Sensitive      SENSITIVE<0.12    * >=100,000 COLONIES/mL ESCHERICHIA COLI   Klebsiella pneumoniae - MIC*    AMPICILLIN Value in next row Resistant      SENSITIVE<0.12    CEFTAZIDIME Value in next row Sensitive      SENSITIVE<0.12    CEFAZOLIN Value in next row Sensitive      SENSITIVE<0.12    CEFTRIAXONE Value in next row Sensitive      SENSITIVE<0.12    CIPROFLOXACIN Value in next row Sensitive      SENSITIVE<0.12    GENTAMICIN Value in next row Sensitive      SENSITIVE<0.12    IMIPENEM Value in next row Sensitive      SENSITIVE<0.12    TRIMETH/SULFA Value in next row Sensitive      SENSITIVE<0.12    NITROFURANTOIN Value in next row  Intermediate      INTERMEDIATE64    PIP/TAZO Value in next row Sensitive      SENSITIVE8    LEVOFLOXACIN Value in next row Sensitive      SENSITIVE<=0.12    * >=100,000 COLONIES/mL KLEBSIELLA PNEUMONIAE  MRSA PCR Screening     Status: None   Collection Time: 01/26/15  7:29 PM  Result Value Ref Range Status   MRSA by PCR NEGATIVE NEGATIVE Final  MRSA PCR Screening     Status: None   Collection Time: 02/22/2015  4:32 PM  Result Value Ref Range Status   MRSA by PCR NEGATIVE NEGATIVE Final    Comment:        The GeneXpert MRSA Assay (FDA approved for NASAL specimens only), is one component of a comprehensive MRSA colonization surveillance program. It is not intended to diagnose MRSA infection nor to guide or monitor treatment for MRSA  infections.     Medical History: Past Medical History  Diagnosis Date  . Asthma   . Anxiety   . Hypertension   . Hyperlipidemia   . CVA (cerebral infarction)   . Paroxysmal a-fib ([place])   . Renal insufficiency   . COPD (chronic obstructive pulmonary disease) ([place])   . Mitral valve insufficiency   . CHF (congestive heart failure) ([place])   . Stroke ([place])   . Left foot drop   . Nontraumatic partial bilateral rotator cuff tear   . Cancer [facility])     lung    Assessment: 81 yo f admitted from [facility] s/p cardiac arrest with estimated downtime of ~10 minutes.  Patient initially presented as a code STEMI, but was shortly cancelled after arrival.  Pharmacy is consulted to dose Unasyn for aspiration pneumonia. Wbc 15.3, tmax 101.3, SCr 2.97 (baseline ~1.5?), CrCl ~14 ml/min.  Unasyn 10/9 >>  MRSA PCR neg  Goal of Therapy:  Eradication of any infection  Plan:  Unasyn 1.5 gm IV q12h F/u CBC, tmax, clinical course  Cassie L. Nicole Kindred, PharmD PGY2 Infectious Diseases Pharmacy Resident Pager: [phone] 02-23-2015 9:47 AM

## 2015-03-07 NOTE — Progress Notes (Signed)
PULMONARY / CRITICAL CARE MEDICINE   Name: [name] MRN: [id number] DOB: 03-Sep-1933    ADMISSION DATE:  02/28/2015 CONSULTATION DATE:  10/8  REFERRING MD :  Winfred Leeds   CHIEF COMPLAINT:  Cardiac arrest   INITIAL PRESENTATION:  81 year old female admitted s/p VF arrest. Down time estimated ~ 20 minutes. PCCM asked to admit. Not cooling candidate.   STUDIES:    SIGNIFICANT EVENTS: 10/9 following commands. Renal fxn a little worse. Family optimistic but also realistic. Added IVFs, nutrition and ABX for possible aspiration.   SUBJECTIVE: sedated  VITAL SIGNS: Temp:  [90.5 F (32.5 C)-101.3 F (38.5 C)] 99.5 F (37.5 C) (10/09 0800) Pulse Rate:  [33-121] 104 (10/09 0926) Resp:  [0-26] 26 (10/09 0926) BP: (71-269)/(25-215) 74/56 mmHg (10/09 0800) SpO2:  [58 %-100 %] 100 % (10/09 0404) FiO2 (%):  [40 %-100 %] 40 % (10/09 0926) Weight:  [60.5 kg (133 lb 6.1 oz)-63.6 kg (140 lb 3.4 oz)] 60.5 kg (133 lb 6.1 oz) (10/09 0500) HEMODYNAMICS:   VENTILATOR SETTINGS: Vent Mode:  [-] PRVC FiO2 (%):  [40 %-100 %] 40 % Set Rate:  [16 bmp-24 bmp] 24 bmp Vt Set:  [460 mL] 460 mL PEEP:  [5 cmH20] 5 cmH20 Plateau Pressure:  [18 OQH47-65 cmH20] 20 cmH20 INTAKE / OUTPUT:  Intake/Output Summary (Last 24 hours) at 02/14/15 0943 Last data filed at 14-Feb-2015 0700  Gross per 24 hour  Intake 2673.33 ml  Output   1985 ml  Net 688.33 ml    PHYSICAL EXAMINATION: General:  Chronically ill appearing aaf female, now following commands Neuro:  Moves extremities, following commands. Sedated now HEENT:  Orally intubated  Cardiovascular:  rrr Lungs:  Scattered rhonchi, decreased on right  Abdomen:  Soft + bowel sound no OM  Musculoskeletal:  Intact  Skin:  No edema   LABS:  CBC  Recent Labs Lab 03/03/2015 1504 02/27/2015 1955 2015-02-14 0345  WBC 34.0* 30.8* 15.3*  HGB 10.5* 10.5* 9.0*  HCT 33.1* 32.2* 26.3*  PLT 185 191 178   Coag's  Recent Labs Lab 02/24/2015 1121  APTT 35  INR  1.38   BMET  Recent Labs Lab 02/06/2015 1044 03/06/2015 1108 02/19/2015 1121 2015-02-14 0345  NA 135  --  133* 139  K 3.5  --  4.3 3.8  CL 91*  --  90* 94*  CO2  --   --  24 20*  BUN 52*  --  51* 64*  CREATININE 2.00* 2.30* 2.27* 2.97*  GLUCOSE 205*  --  240* 58*   Electrolytes  Recent Labs Lab 02/05/2015 1121 02/18/2015 1654 02-14-15 0345  CALCIUM 8.1*  --  8.4*  MG  --  2.7* 2.4  PHOS  --   --  5.6*   Sepsis Markers  Recent Labs Lab 03/06/2015 1044  LATICACIDVEN 8.25*   ABG  Recent Labs Lab February 14, 2015 0352  PHART 7.477*  PCO2ART 28.4*  PO2ART 139*   Liver Enzymes No results for input(s): AST, ALT, ALKPHOS, BILITOT, ALBUMIN in the last 168 hours. Cardiac Enzymes  Recent Labs Lab 03/04/2015 1121  TROPONINI 1.78*   Glucose  Recent Labs Lab 02-14-15 0326 February 14, 2015 0428 02/14/2015 0542 2015/02/14 0624 2015/02/14 0650 02/14/2015 0841  GLUCAP 61* 81 51* 66 81 99    Imaging Dg Chest Port 1 View  02-14-2015   CLINICAL DATA:  Followup pulmonary edema  EXAM: PORTABLE CHEST 1 VIEW  COMPARISON:  02/17/2015  FINDINGS: Endotracheal tube tip is 3 cm above the carina. Nasogastric  tube enters the abdomen. Cardiomegaly persists. Abnormal density throughout the right lung and in the left lower lobe persists. I think there is worsened volume/consolidation loss at both lung bases.  IMPRESSION: Worsening volume loss/ consolidation in both lower lobes.   Electronically Signed   By: Nelson Chimes M.D.   On: 02-16-2015 08:00   Dg Chest Portable 1 View  02/10/2015   CLINICAL DATA:  Endotracheal tube placement.  EXAM: PORTABLE CHEST 1 VIEW  COMPARISON:  January 26, 2015.  FINDINGS: Stable cardiomegaly. Endotracheal tube is seen projected over tracheal air shadow with distal tip 2.5 cm above the carina. Nasogastric tube is seen entering stomach. No pneumothorax is noted. Severe degenerative changes seen involving both glenohumeral joints. Left lung is clear. Mild central pulmonary vascular  congestion is noted with possible perihilar edema.  IMPRESSION: Endotracheal tube in grossly good position. Mild central pulmonary vascular congestion is noted with possible perihilar edema.   Electronically Signed   By: Marijo Conception, M.D.   On: 03/06/2015 11:24  R>L airspace disease    ASSESSMENT / PLAN:  PULMONARY OETT 10/8 A: Acute respiratory failure s/p cardiopulmonary arrest  R> L pulmonary infiltrates c/w edema vs aspiration  P:   Full vent support F/u abg  PAD protocol  CARDIOVASCULAR CVL A:  Cardiac arrest  VT arrest Systolic CM  Severe CAD P:  Supportive care Tele Asa and medical rx Re-assess for diuresis  Not cc candidate   RENAL A:   AKI Lactic acidosis  P:   Fluid challenge Add MIVF Not HD candidate   GASTROINTESTINAL A:   No acute  P:   Start tube feeds PPI   HEMATOLOGIC A:   Chronic anemia  P:  Trend cbc [place] heparin  No role for xfusion   INFECTIOUS A:   Possible aspiration  P:   Sputum 10/9>>> unasyn 10/9>>>  ENDOCRINE A:   Hyperglycemia/hypoglycemia  P:   ssi   NEUROLOGIC A:   Acute encephalopathy s/p cardiac arrest; almost certainly anoxic injury Remote CVA w/ left foot drop  >following commands. Reaching for ETT. Reacting to family 10/9 P:   RASS goal: -2 PAD protocol  EEG  FAMILY  - Updates:   - Inter-disciplinary family meet or Palliative Care meeting due by:  10/15    TODAY'S SUMMARY: VF arrest. Now full DNR w/ medical rx. Down time assumed 20 minutes.  EEG pending. Now following commands. We will cont supportive medicine. Family is hopeful but realistic. Wants to continue supportive care.     Erick Colace ACNP-BC [place] Pager # [phone] OR # [phone] if no answer   February 16, 2015, 9:43 AM   Approve of management Poor prognosis even with neuro changes Wishes were clear Continue support  Lavon Paganini. Titus Mould, MD, [place] Pgr: [facility]

## 2015-03-07 NOTE — Progress Notes (Signed)
Expiration Note:  Patient expired @ 5110 s/p PEA.  See code status.  Listened to heart tones x1 full minute with Marni Griffon, N.P. and Lavella Lemons Joline Encalada,R.N. @ bedside with family.  Daughter notified and en route.  Ventilator discontinued by Laurey Arrow, N.P..CDS notifed.

## 2015-03-07 NOTE — Progress Notes (Signed)
Patient Name: [name] Date of Encounter: 03/01/2015  Active Problems:   Cardiac arrest ([place])   Length of Stay: 1  SUBJECTIVE  Per daughter the patient is responding to commands, follows with eyes.   CURRENT MEDS . antiseptic oral rinse  7 mL Mouth Rinse 10 times per day  . chlorhexidine gluconate  15 mL Mouth Rinse BID  . insulin aspart  0-9 Units Subcutaneous 6 times per day  . ipratropium-albuterol  3 mL Nebulization Q6H  . midazolam  5 mg Intravenous Once  . pantoprazole (PROTONIX) IV  40 mg Intravenous QHS   OBJECTIVE  Filed Vitals:   01-Mar-2015 0614 2015/03/01 0617 2015/03/01 0635 2015/03/01 0700  BP: 83/64 72/58 90/42  95/67  Pulse:    70  Temp: 101.3 F (38.5 C) 101.3 F (38.5 C) 101.1 F (38.4 C) 100.6 F (38.1 C)  TempSrc:      Resp: 12 22 23 21   Height:      Weight:      SpO2:        Intake/Output Summary (Last 24 hours) at 03/01/2015 0845 Last data filed at 03-01-2015 0700  Gross per 24 hour  Intake 2673.33 ml  Output   1985 ml  Net 688.33 ml   Filed Weights   02/06/2015 1300 03-01-15 0500  Weight: 140 lb 3.4 oz (63.6 kg) 133 lb 6.1 oz (60.5 kg)   PHYSICAL EXAM  General: Intubated, sedated  Neck: Supple without bruits or JVD. Lungs:  Resp regular and unlabored, B/L rales Heart: RRR no s3, s4, or murmurs. Abdomen: Soft, non-tender, non-distended, BS + x 4.  Extremities: No clubbing, cyanosis or edema. DP/PT/Radials 2+ and equal bilaterally.  Accessory Clinical Findings  CBC  Recent Labs  02/10/2015 1051  02/14/2015 1955 03/01/15 0345  WBC 9.8  < > 30.8* 15.3*  NEUTROABS 4.8  --   --   --   HGB 11.4*  < > 10.5* 9.0*  HCT 36.1  < > 32.2* 26.3*  MCV 90.0  < > 87.7 84.3  PLT 158  < > 191 178  < > = values in this interval not displayed. Basic Metabolic Panel  Recent Labs  02/18/2015 1121 02/05/2015 1654 Mar 01, 2015 0345  NA 133*  --  139  K 4.3  --  3.8  CL 90*  --  94*  CO2 24  --  20*  GLUCOSE 240*  --  58*  BUN 51*  --  64*  CREATININE  2.27*  --  2.97*  CALCIUM 8.1*  --  8.4*  MG  --  2.7* 2.4  PHOS  --   --  5.6*   Dg Chest Port 1 View  2015/03/01   CLINICAL DATA:  Followup pulmonary edema  EXAM: PORTABLE CHEST 1 VIEW  COMPARISON:  02/11/2015  FINDINGS: Endotracheal tube tip is 3 cm above the carina. Nasogastric tube enters the abdomen. Cardiomegaly persists. Abnormal density throughout the right lung and in the left lower lobe persists. I think there is worsened volume/consolidation loss at both lung bases.  IMPRESSION: Worsening volume loss/ consolidation in both lower lobes.   Electronically Signed   By: Kyeisha Janowicz Chimes M.D.   On: 03/01/2015 08:00   Dg Chest Portable 1 View  02/11/2015   CLINICAL DATA:  Endotracheal tube placement.  EXAM: PORTABLE CHEST 1 VIEW  COMPARISON:  January 26, 2015.  FINDINGS: Stable cardiomegaly. Endotracheal tube is seen projected over tracheal air shadow with distal tip 2.5 cm above the carina. Nasogastric tube  is seen entering stomach. No pneumothorax is noted. Severe degenerative changes seen involving both glenohumeral joints. Left lung is clear. Mild central pulmonary vascular congestion is noted with possible perihilar edema.  IMPRESSION: Endotracheal tube in grossly good position. Mild central pulmonary vascular congestion is noted with possible perihilar edema.   Electronically Signed   By: Marijo Conception, M.D.   On: 03/01/2015 11:24   TELE: Sinus tachycardia  ECG:   TTE: 01/27/2015 - Left ventricle: The cavity size was normal. There was moderate concentric hypertrophy. Systolic function was severely reduced. The estimated ejection fraction was in the range of 25% to 30%. Diffuse hypokinesis. The study is not technically sufficient to allow evaluation of LV diastolic function. - Mitral valve: There was moderate regurgitation. - Left atrium: The atrium was severely dilated. - Right atrium: The atrium was moderately dilated. - Tricuspid valve: There was moderate regurgitation. -  Pulmonary arteries: Systolic pressure was moderately increased. PA peak pressure: 52 mm Hg (S).    ASSESSMENT AND PLAN  81 y.o.female history of chronic systolic HF (echo 06/4266 LVEF 25-30%), family reports several year history of low LVEF. She was followed initially by Dr Clayborn Bigness at [facility]. Family reports a cath about 2 years ago that was normal. She also has history of PAF, HTN, CKD, MR, TR admitted with cardiac arrest from her nursing home. History is indirect but apparently she had VF arrest requiring 6 defibrillations as well as multiple rounds of epi.   1. Cardiac arrest - reported VF in the field, required 6 defibrillations - currently in stable junctional rhythm with her underlying conduction delay, nonspecific ST elevation in inferior leads in setting of conduction delay.  - patient with overall poor functional capacity, prolonged pulseless arrest. Unclear if arrhtymia due to her chronic LV dysfunction or acute process. Discussed case with ICU team and interventional cardiology Dr Tamala Julian. Will manage her medically at this time, will not proceed with cath lab as we collect more date and follow the recovery of her functional status s/p prolonged code. She remains hemodynamically stable at this time with stable rhythm - if recurrent ventricular arrhythmia would start amiodarone, would not start at this time given stable rhythm and the fact she is junctional rhythm - repeat echo once stabilized  - the patient shows signs of neurologic recovery - CXR shows B/L consolidation - also running fever, I will start Unasyn for aspiration pneumonia- pharmacy to dose as her Crea is 2.9.    Signed, Dorothy Spark MD, [facility] Mar 08, 2015

## 2015-03-07 NOTE — Progress Notes (Addendum)
Chaplain was in unit and RN asked chaplain to pray with family post-death of patient.  Small family gathered.  Prayer at bedside.  Rev. Dunmore, [place]

## 2015-03-07 NOTE — Discharge Summary (Addendum)
NAMESHEY, YOTT               ACCOUNT NO.:  [id number]  MEDICAL RECORD NO.:  [id number]  LOCATION:  2H08C                        FACILITY:  [facility]  PHYSICIAN:  Raylene Miyamoto, MD DATE OF BIRTH:  [birth year]  DATE OF ADMISSION:  02/10/2015 DATE OF DISCHARGE:  02/18/2015                              DISCHARGE SUMMARY   DEATH SUMMARY:  This was a 81 year old female, admitted status post VFib arrest with downtime estimated to be 20 minutes.  __________ was asked to admit the patient.  She was not a cooling candidate upon admission. On October 9, the patient seemed to have improved to some extent.  On admission, it was crystal clear that the patient would not have wanted aggressive heroic measures.  She had been declining significantly over the last couple of months.  She had known chronic systolic heart failure with ejection fraction of 25%-30% and has had that for some time of over a year.  She required six defibrillations as well as multiple rounds of epi upon her presentation.  She had a history of PAF, hypertension, CKD, mitral regurgitation, tricuspid regurgitation, and conduction delay. Cardiology was consulted, seemingly her arrhythmia was likely nonischemic in nature.  Cardiac catheterization was not pursued.  Her lactic acid was over 6.5 and so concern for poor prognosis.  Discussions were held with her lovely daughter who really keyed in the patient's prior wishes of not undergo hypothermia or temperature management given the patient's wishes under circumstances such as this.  So, the patient underwent a __________.  The next morning, she did in fact started to follow commands, and then, suddenly and unexpectedly, at that stage, suffered a repeat cardiac arrest, and she was a DNR at that time when she expired.  FINAL DIAGNOSES UPON DEATH: 1. Acute respiratory failure. 2. Status post cardiac arrest x2, first being VT arrest. 3. Systolic heart failure with a known  cardiomyopathy, ejection     fraction 25%. 4. Severe CAD in the past. 5. Lactic acidosis status post arrest. 6. Acute encephalopathy, multifactorial in nature with anoxia. 7. 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

## 2015-03-07 NOTE — Progress Notes (Signed)
Had been looking better this am.  Developed acute onset of PEA.   No palp pulse No spont resp  Time of death [place] ACNP-BC [place] Pager # [phone] OR # [phone] if no answer

## 2015-03-07 NOTE — Progress Notes (Signed)
Utilization Review Completed.[name] T10/01/2015  

## 2015-03-07 DEATH — deceased

## 2015-04-18 ENCOUNTER — Other Ambulatory Visit: Payer: Medicare Other

## 2015-04-18 ENCOUNTER — Ambulatory Visit: Payer: Medicare Other | Admitting: Oncology

## 2016-03-28 IMAGING — CR DG SACRUM/COCCYX 2+V
3 series · 3 of 3 positions shown · non-contrast
Comparison: None.

CLINICAL DATA: Dyspnea, fall, low back pain

EXAM:
SACRUM AND COCCYX - 2+ VIEW

[sacrum lat]
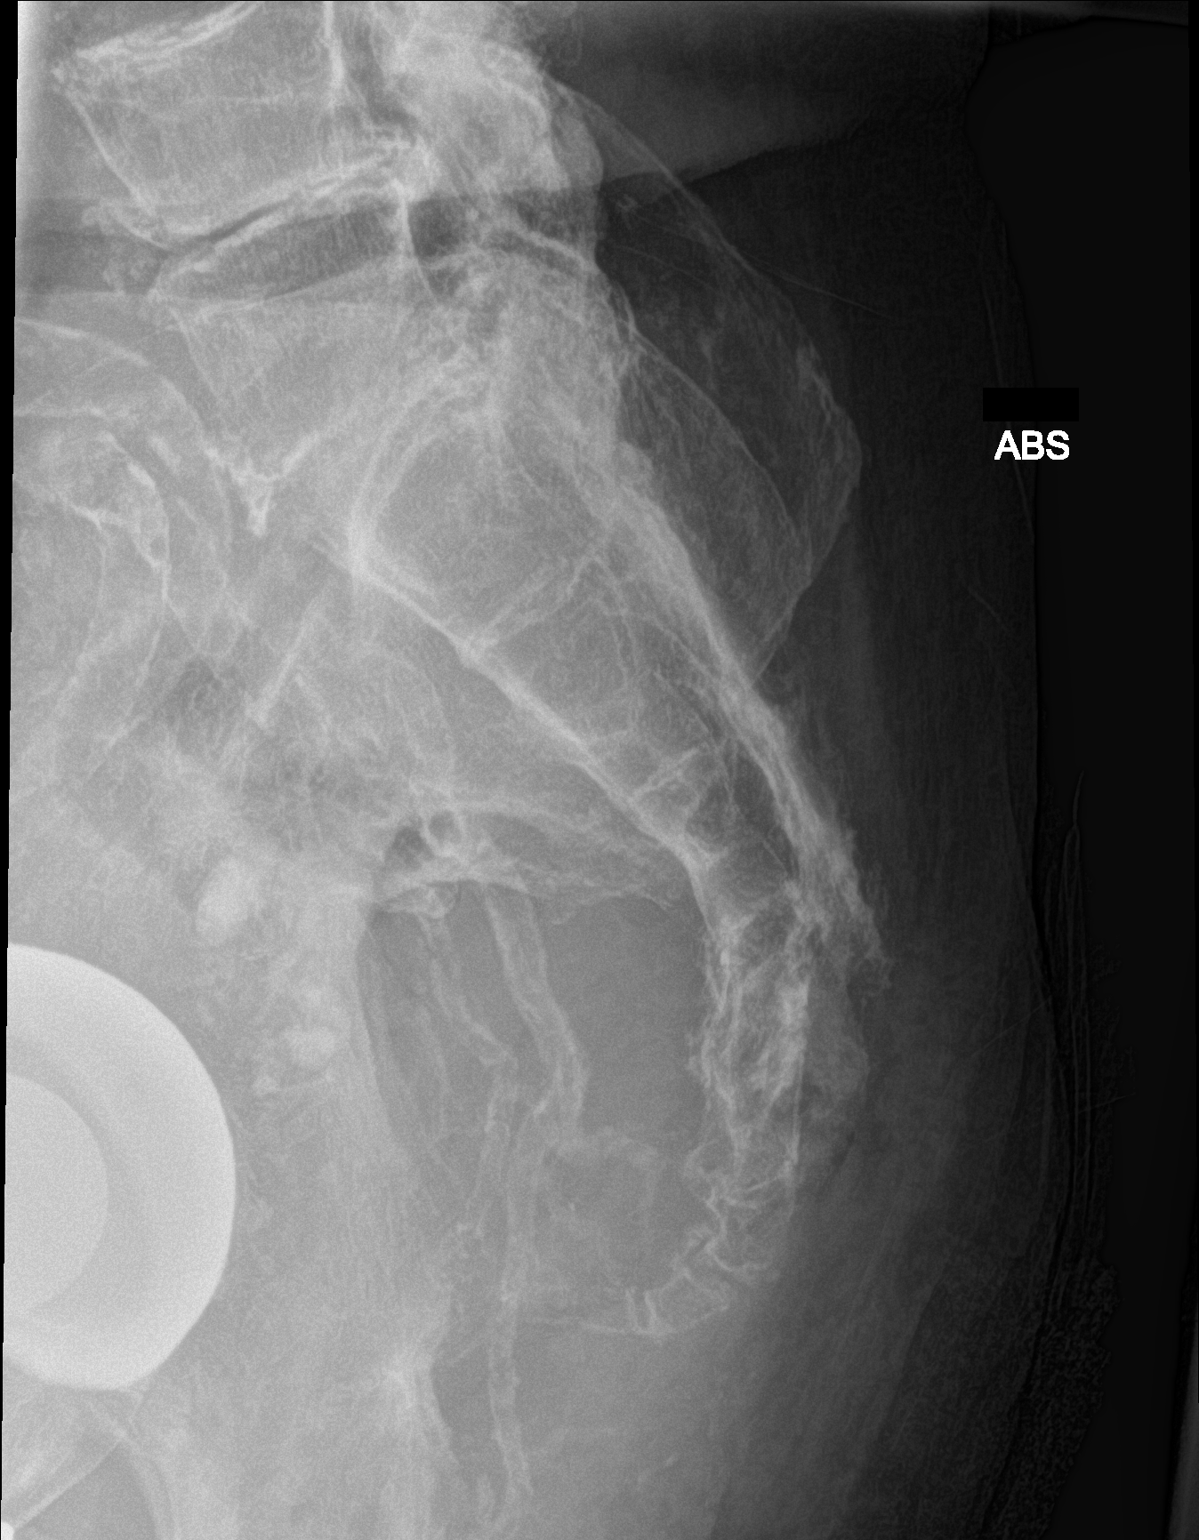

[sacrum ap]
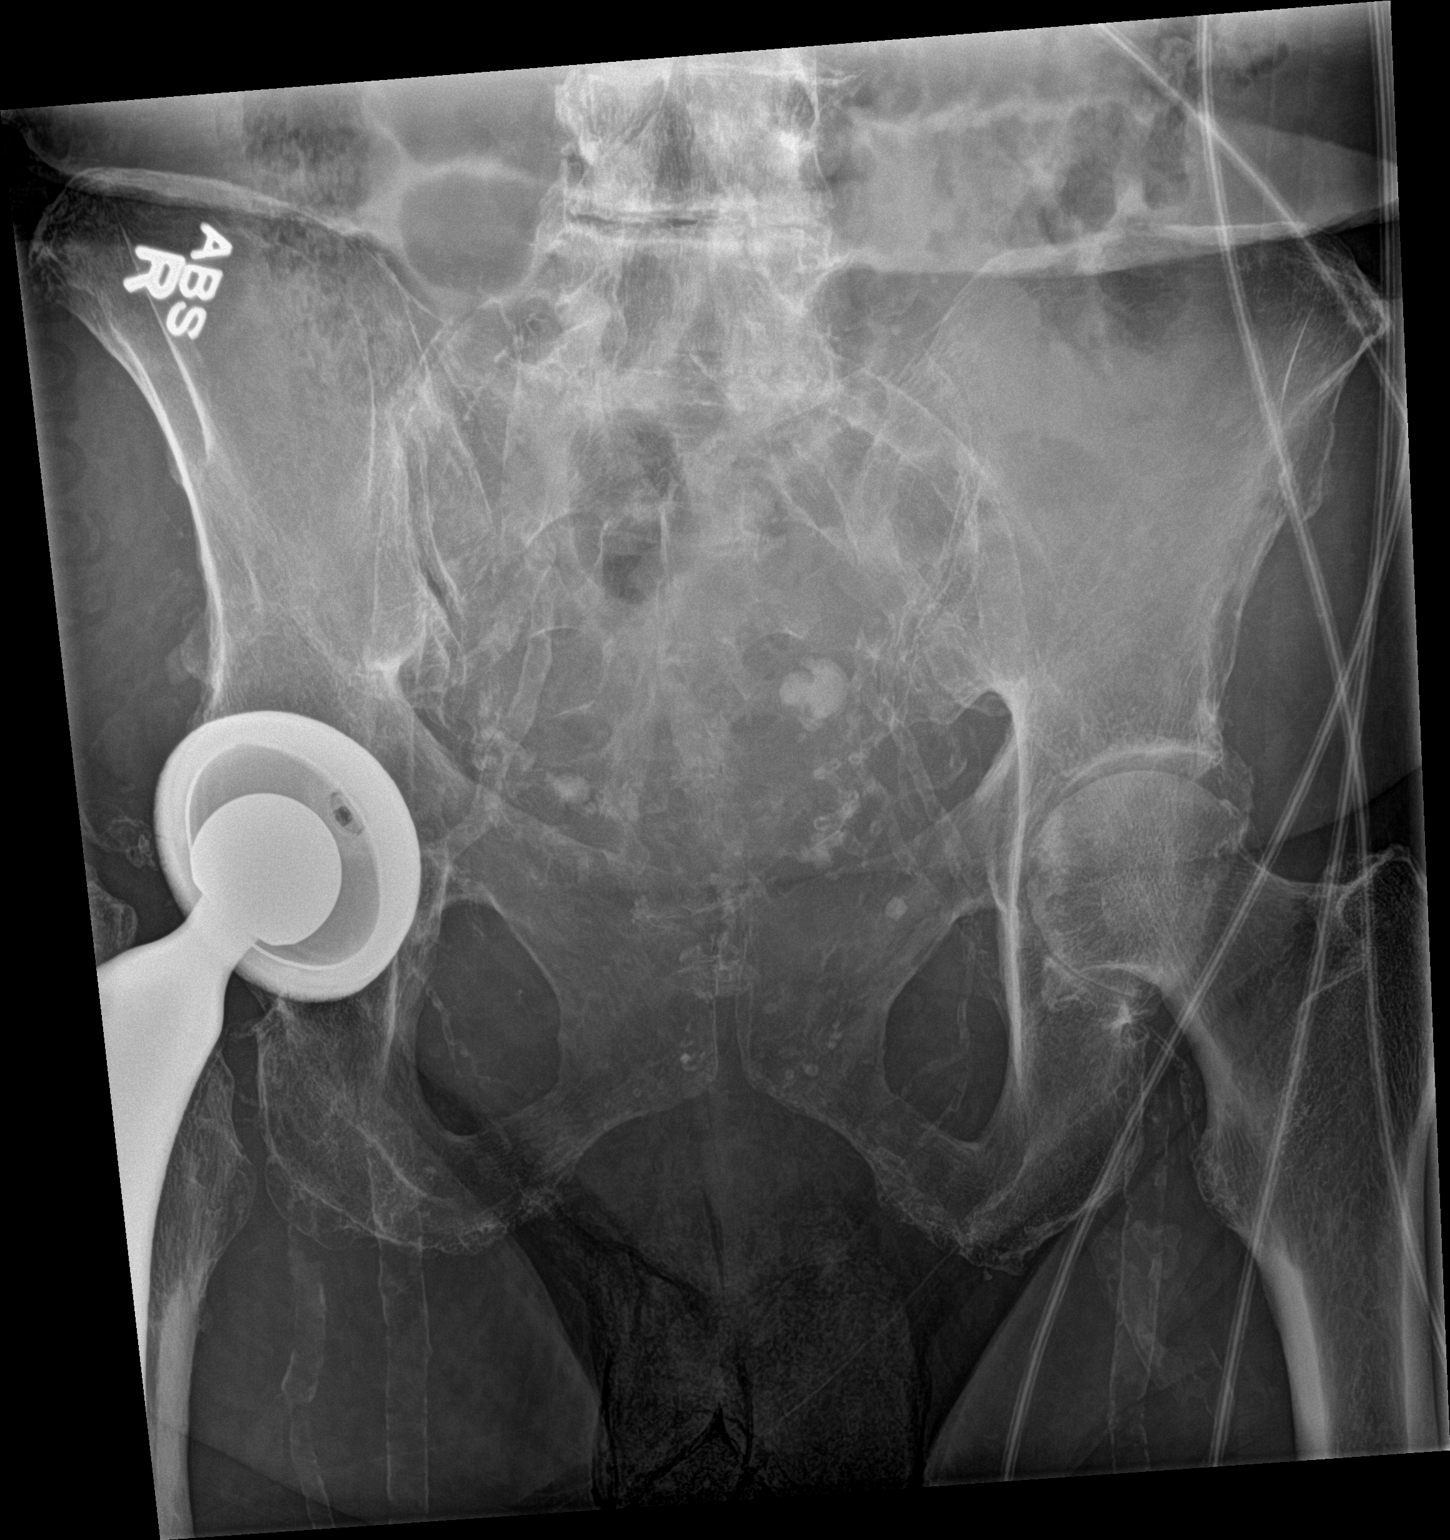

[coccyx ap]
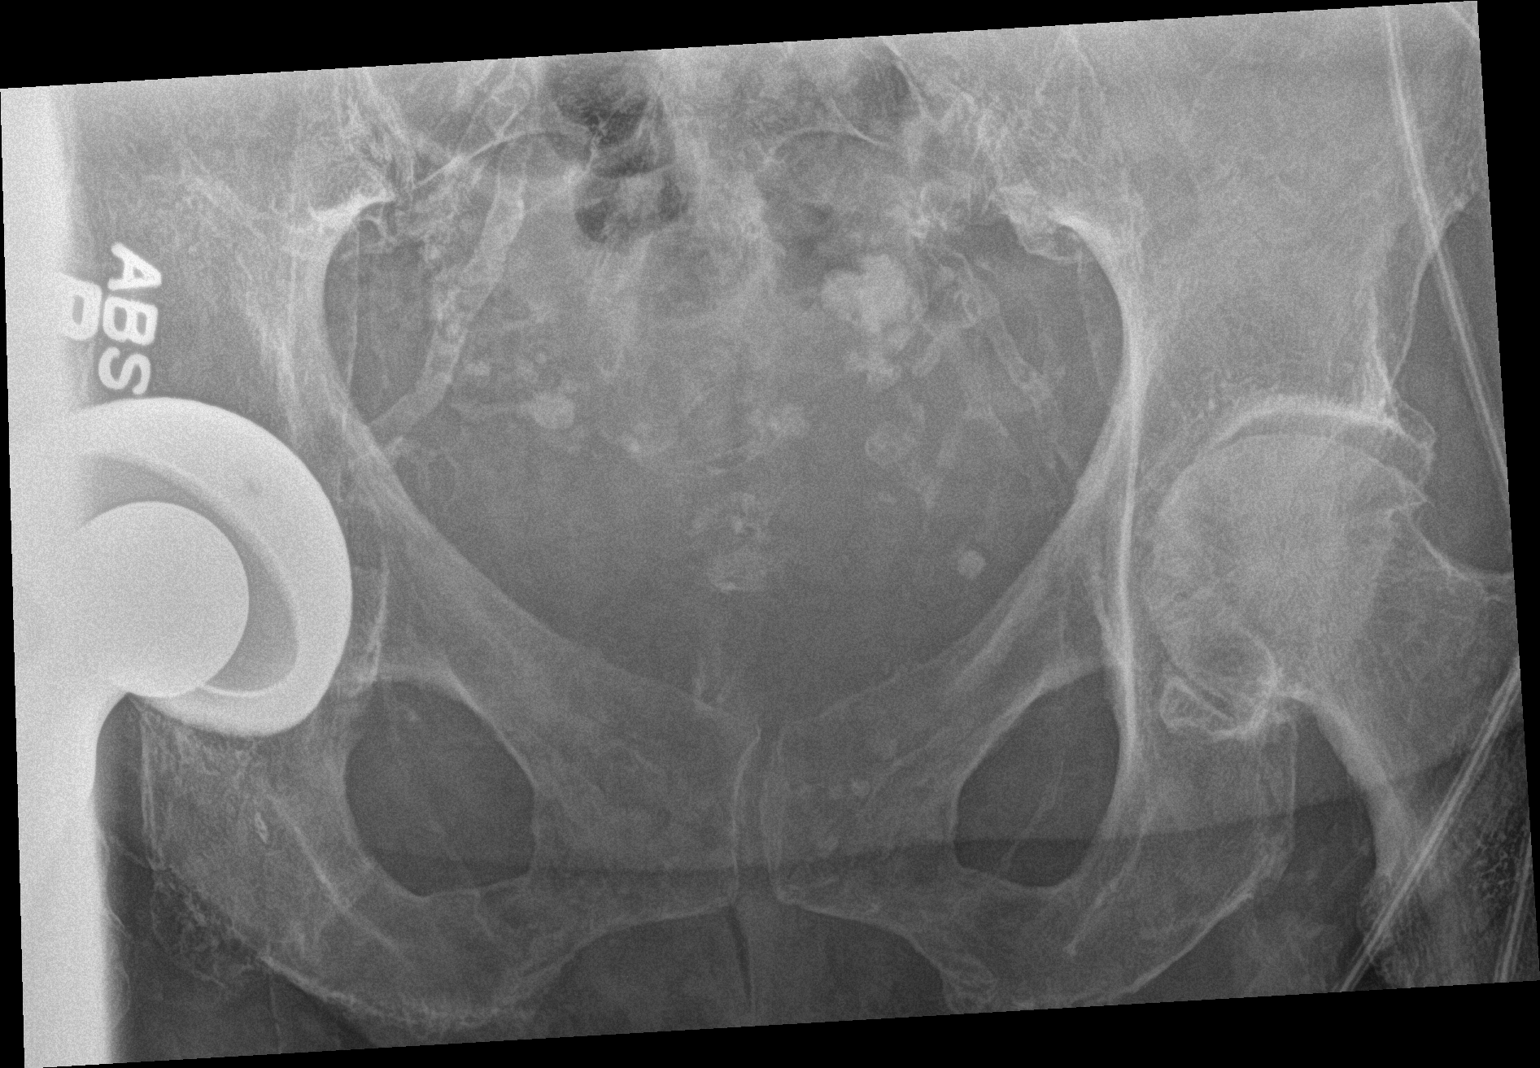

[3 of 3 positions shown; findings below may reference images not displayed]

FINDINGS: There is generalized osteopenia. The sacrum is limited evaluation
secondary to overlying bowel gas. No gross sacral fracture.

Right total hip arthroplasty. Moderate osteoarthritis of the left
hip. Mild degenerative changes of bilateral sacroiliac joints.
Degenerative disc disease at L4-5 and L5-S1 with bilateral facet
arthropathy.

There is peripheral vascular atherosclerotic disease.
IMPRESSION: No acute osseous injury of the of the sacrum.

## 2016-03-28 IMAGING — CR DG CHEST 2V
2 series · 2 of 2 positions shown · non-contrast
Comparison: December 21, 2014.

CLINICAL DATA: Dyspnea after fall.

EXAM:
CHEST  2 VIEW

[chest lat]
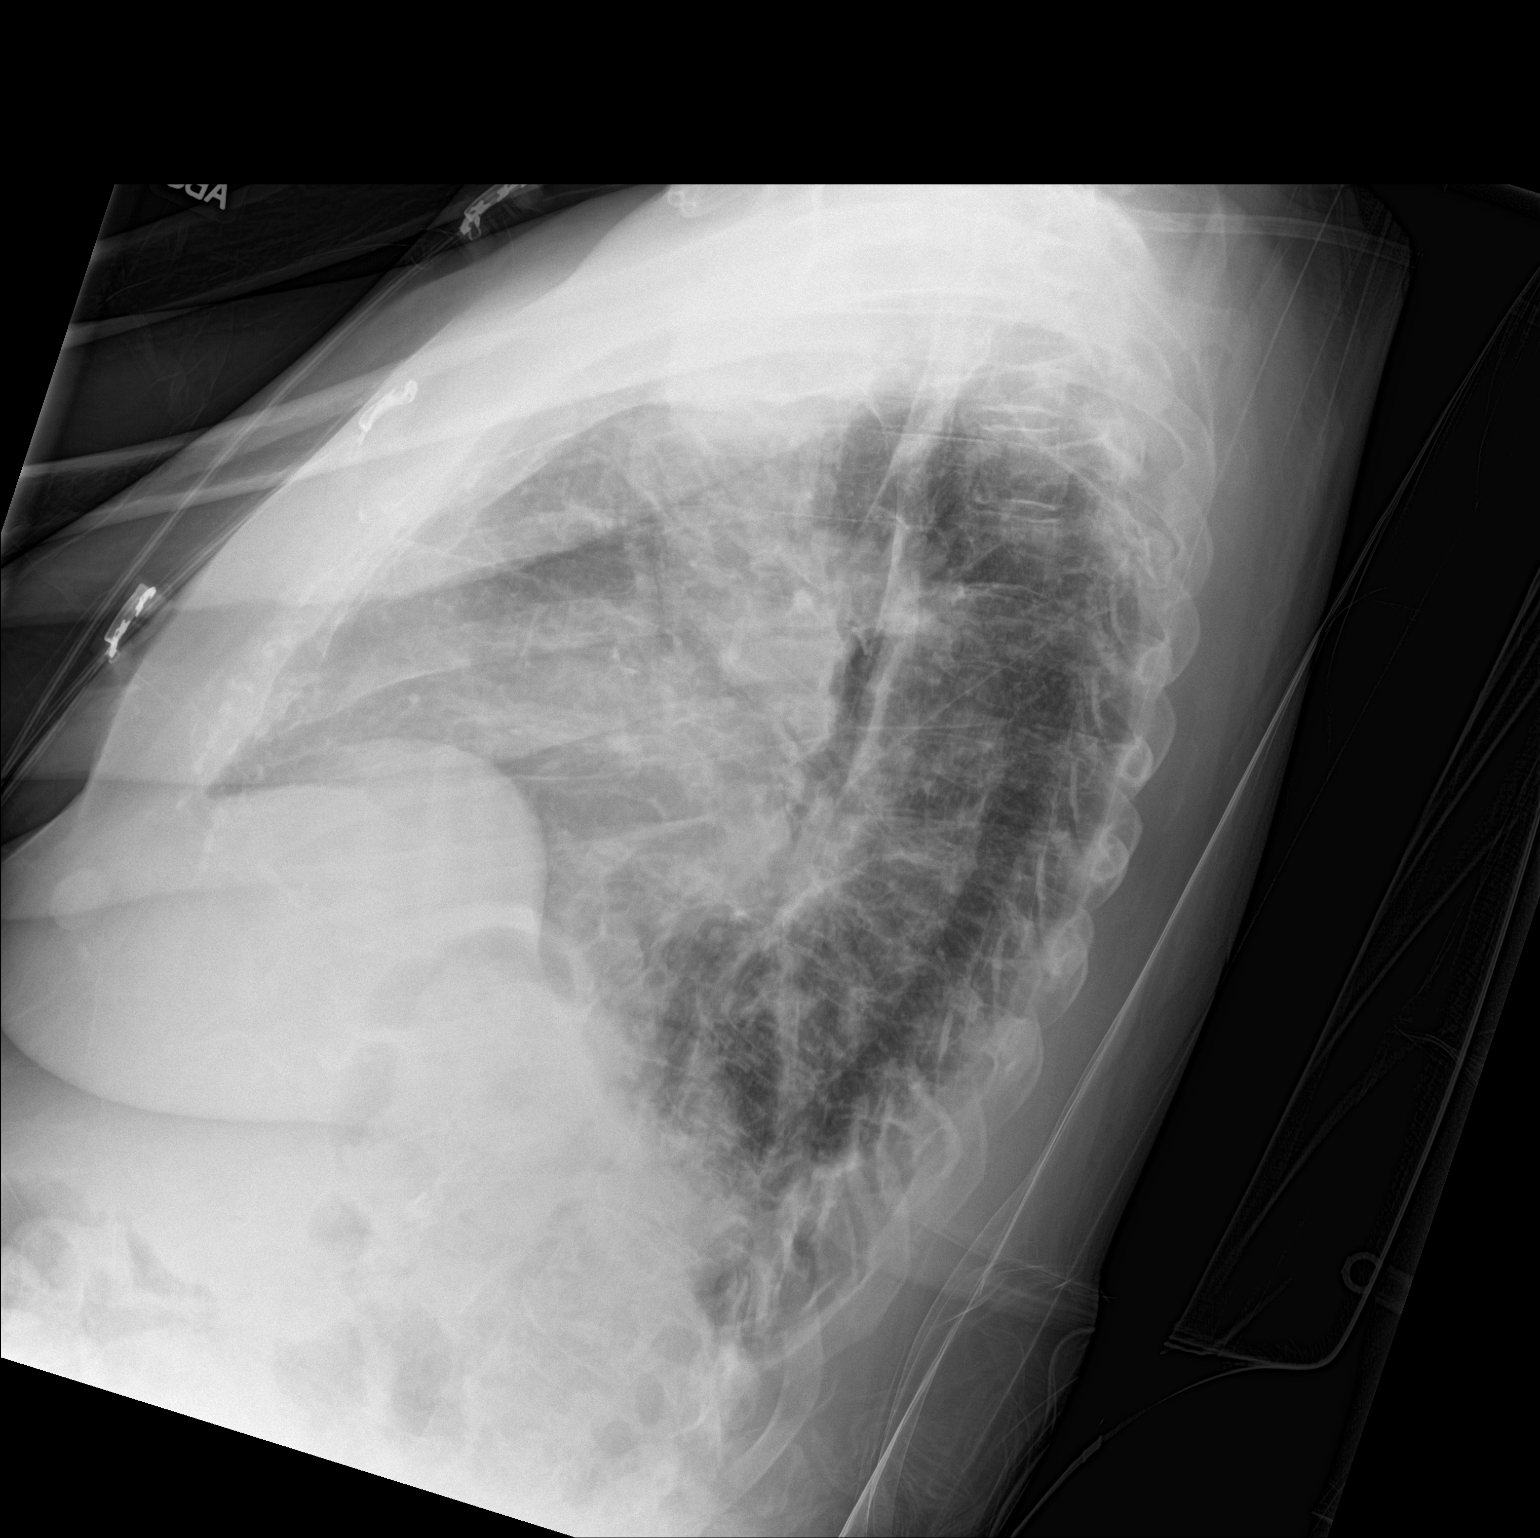

[chest ap]
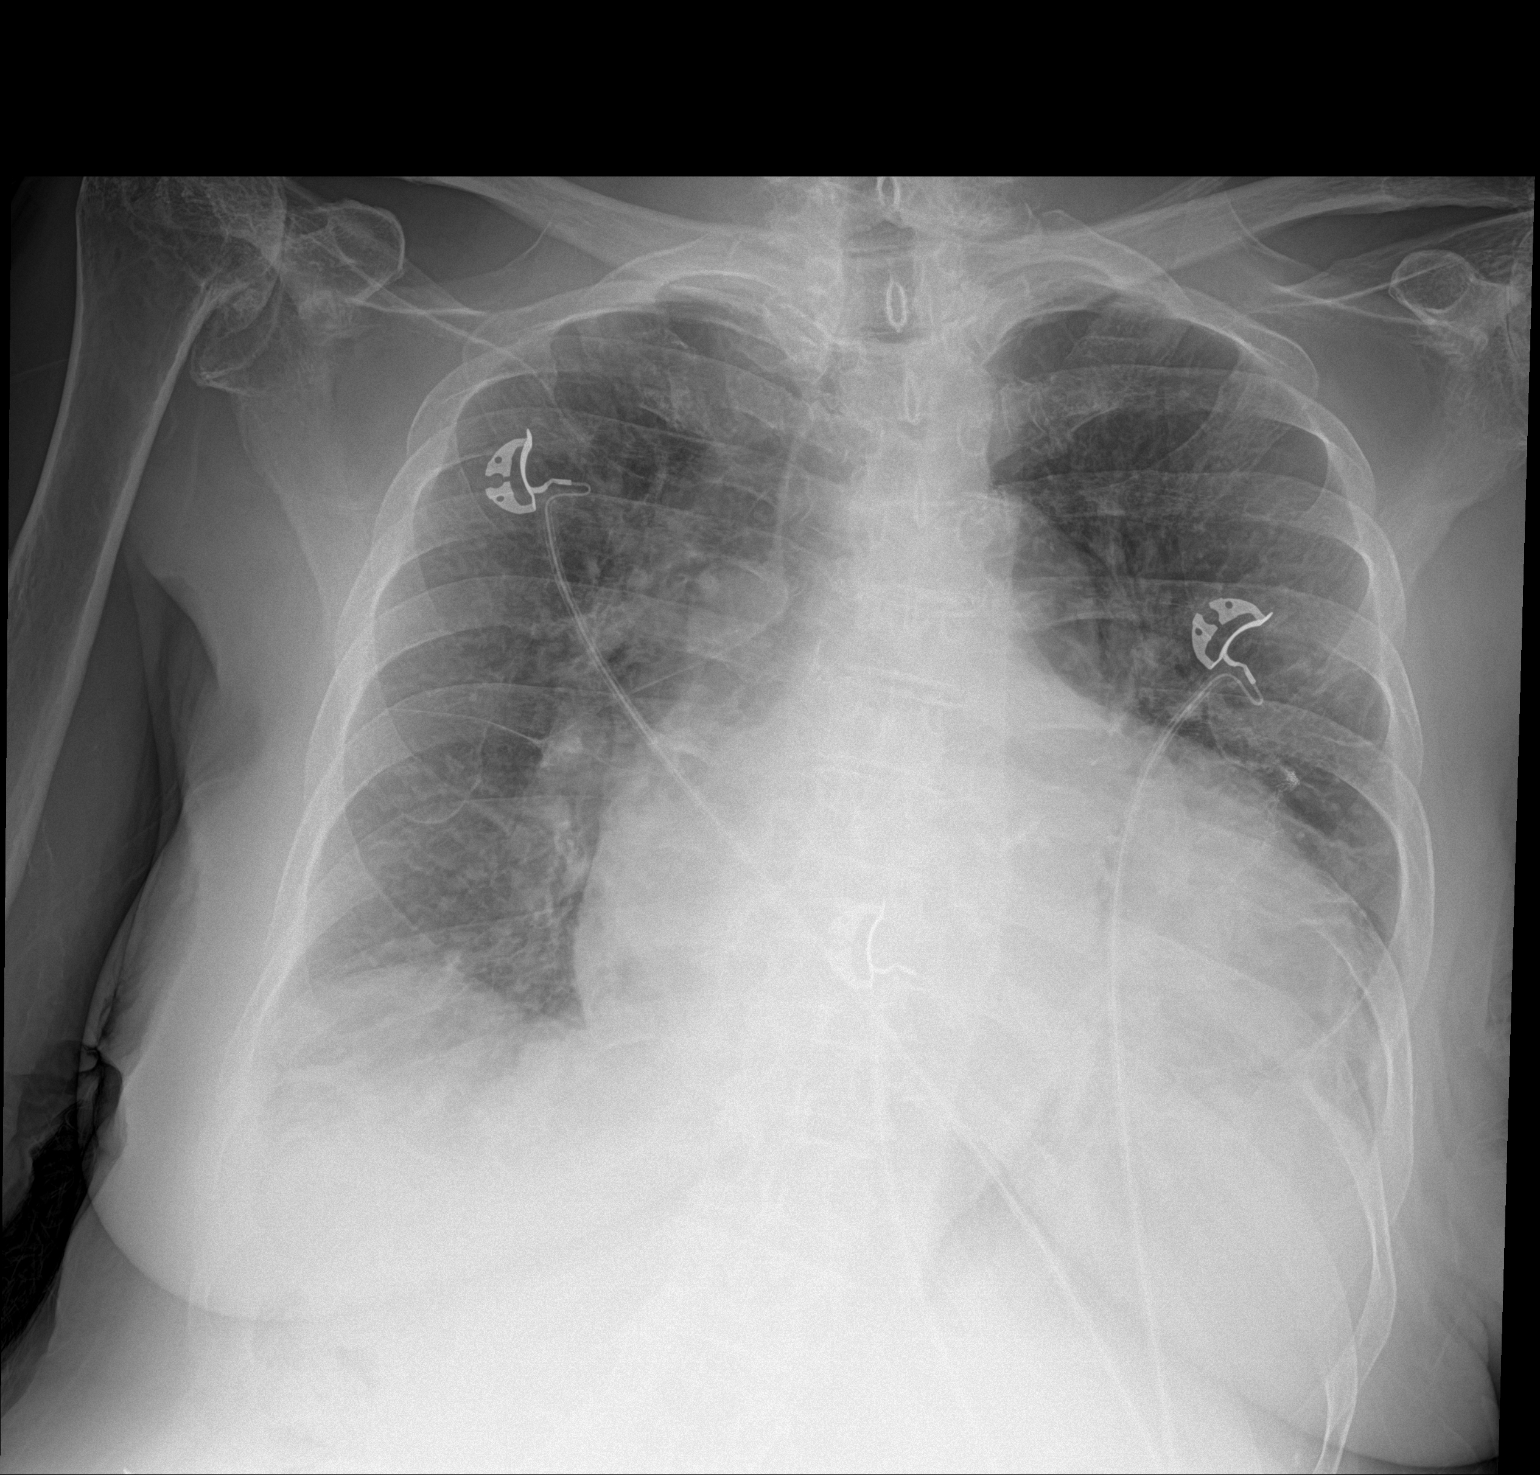

[2 of 2 positions shown; findings below may reference images not displayed]

FINDINGS: Stable cardiomegaly. No pneumothorax or pleural effusion is noted.
No acute pulmonary disease is noted. Severe degenerative changes
seen involving the right glenohumeral joint.
IMPRESSION: Stable cardiomegaly.  No acute pulmonary disease is noted.

## 2017-03-27 IMAGING — US US TRANSVAGINAL NON-OB
1 series · 13 of 25 positions shown · non-contrast
Comparison: None

CLINICAL DATA: Postmenopausal bleeding. Lower abdominal pain.
Pelvic mass identified on recent CT.

EXAM:
TRANSABDOMINAL AND TRANSVAGINAL ULTRASOUND OF PELVIS
TECHNIQUE: Both transabdominal and transvaginal ultrasound examinations of the
pelvis were performed. Transabdominal technique was performed for
global imaging of the pelvis including uterus, ovaries, adnexal
regions, and pelvic cul-de-sac. It was necessary to proceed with
endovaginal exam following the transabdominal exam to visualize the
uterus and endometrium.

[Series 1: us transvaginal non-ob · 0.24mm/px · 13 of 84 slices shown]
[im 1/84]
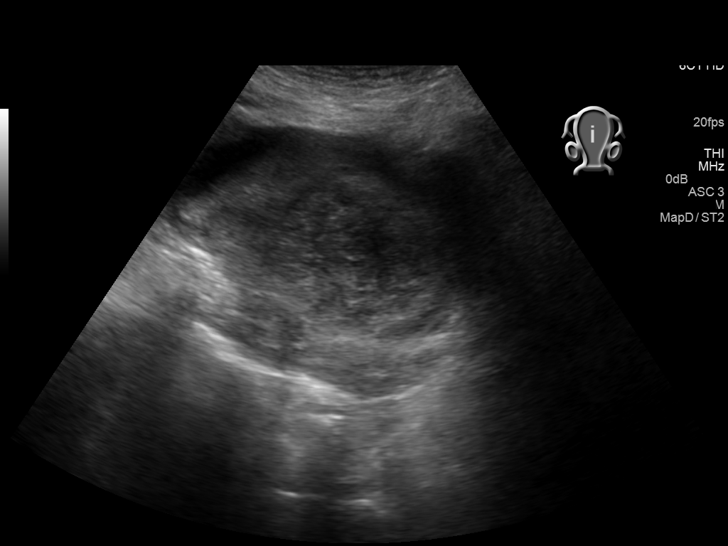
[im 7/84]
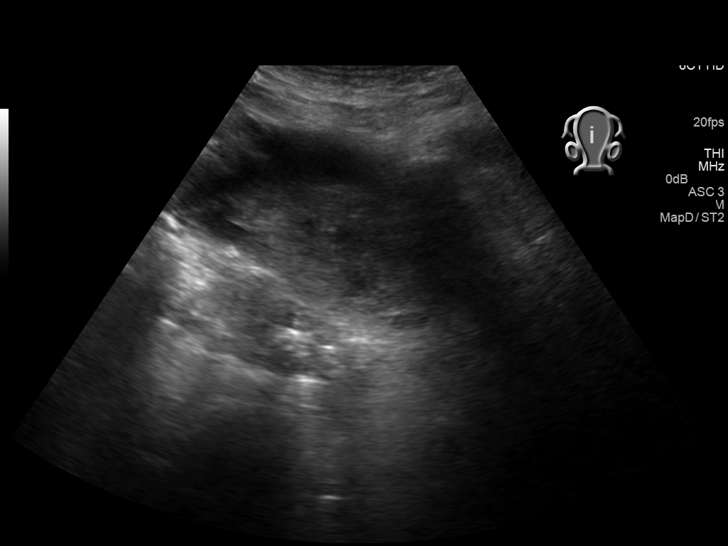
[im 14/84]
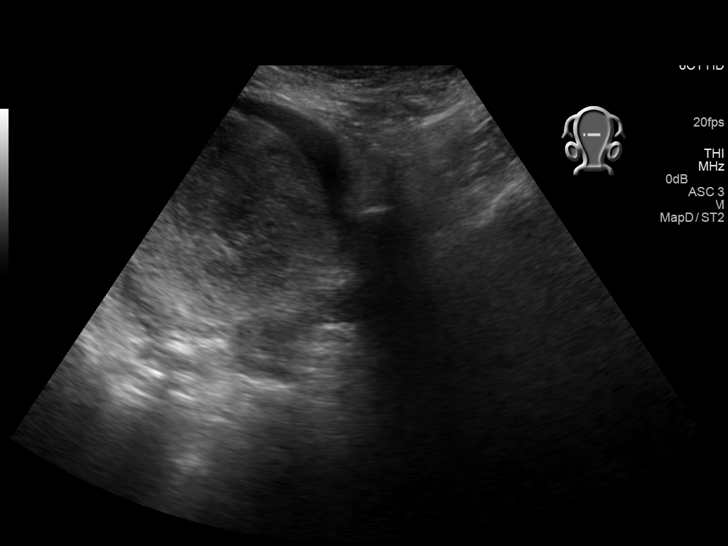
[im 21/84]
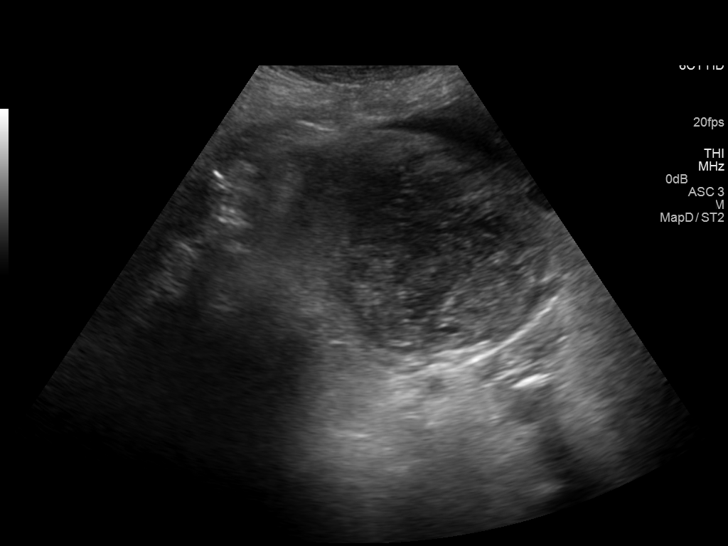
[im 28/84]
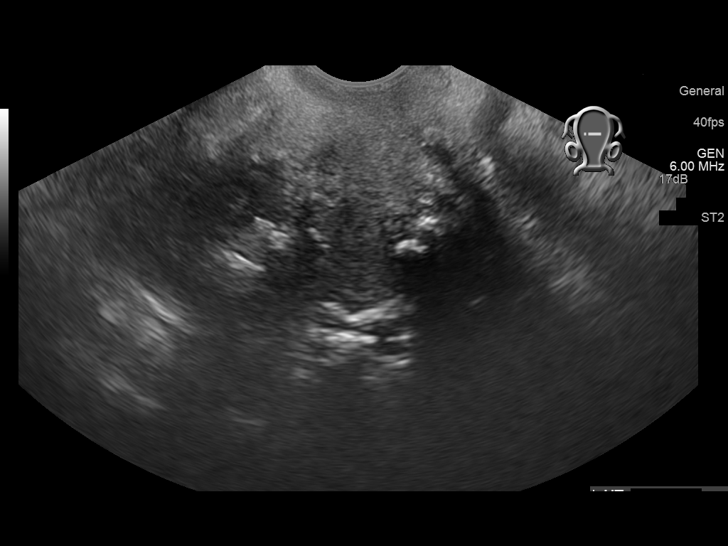
[im 35/84]
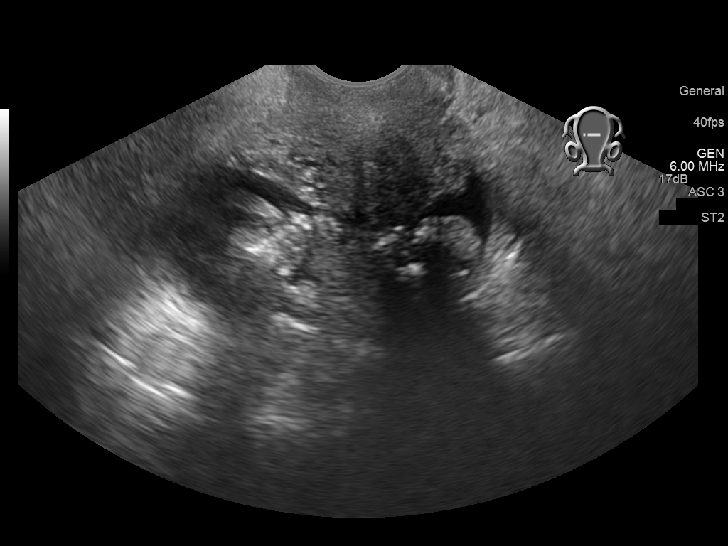
[im 42/84]
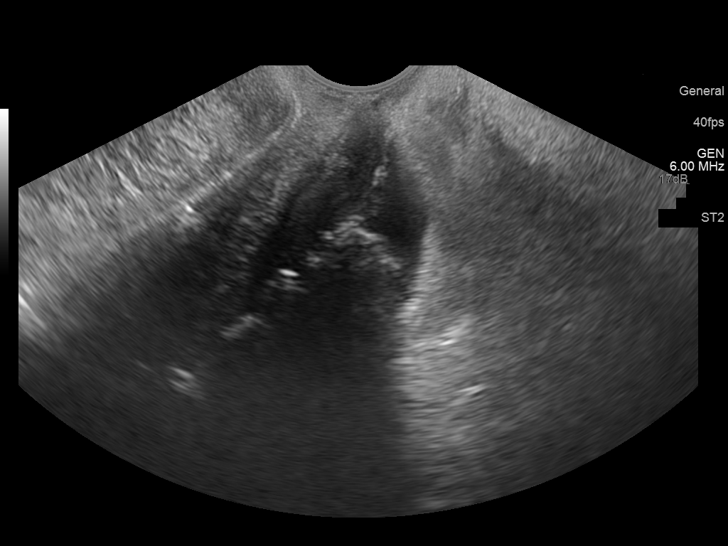
[im 49/84]
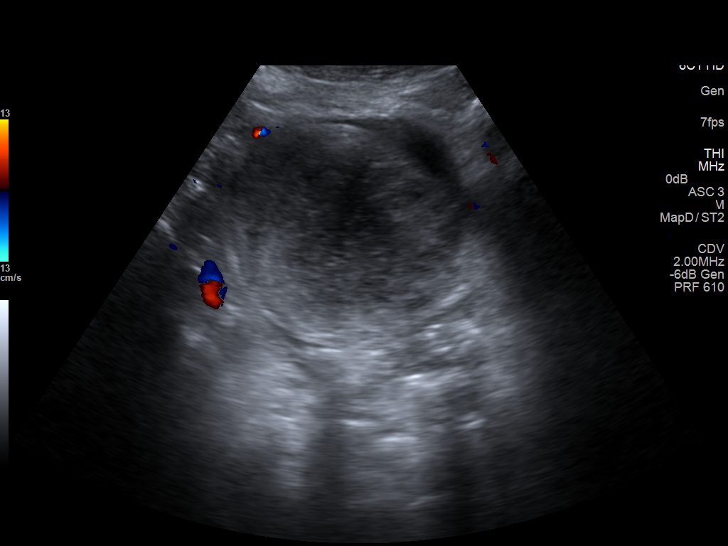
[im 56/84]
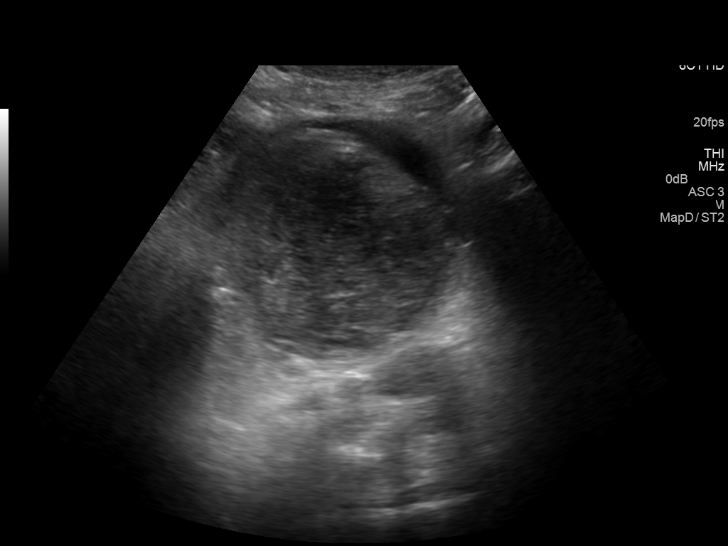
[im 63/84]
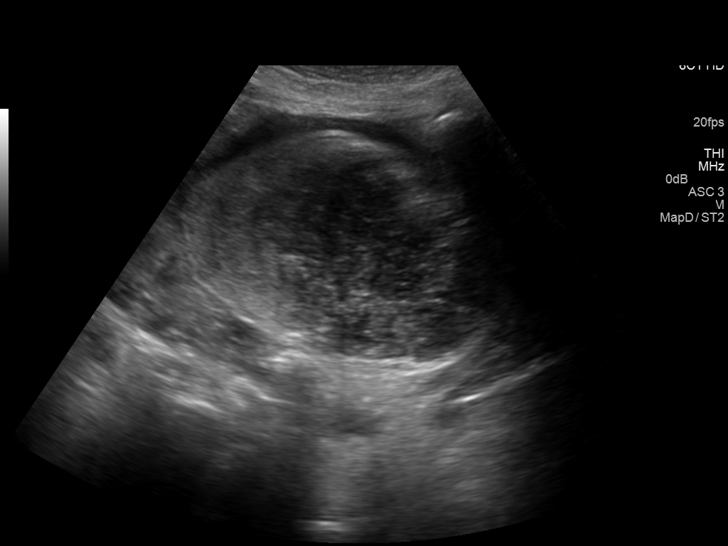
[im 70/84]
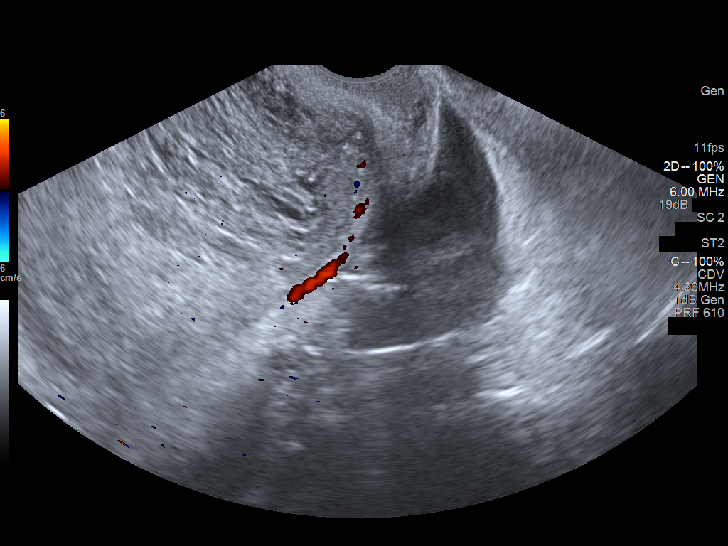
[im 77/84]
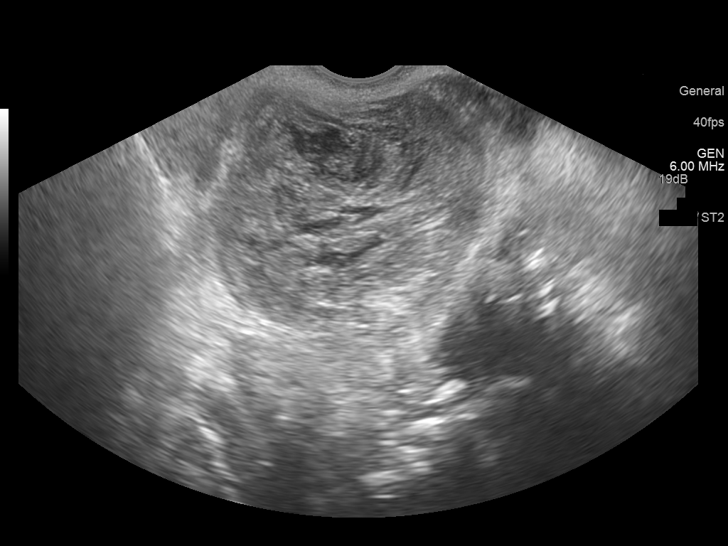
[im 84/84]
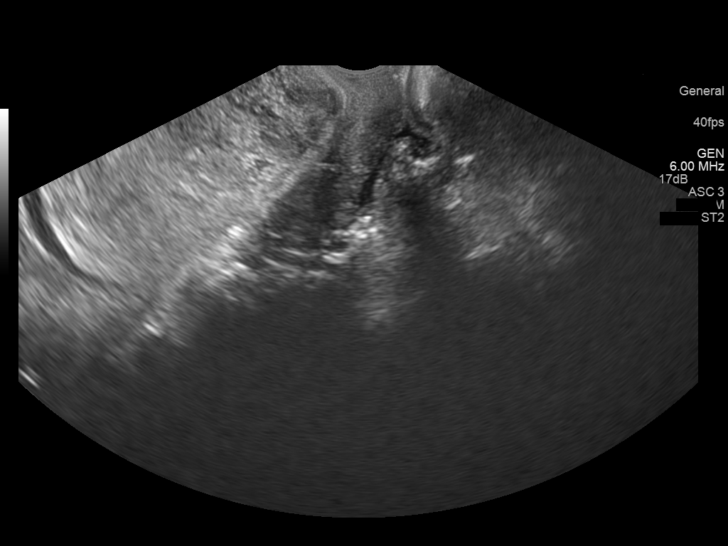

[13 of 25 positions shown; findings below may reference images not displayed]

FINDINGS: Uterus

Measurements: 6.3 x 2.3 x 4.7 cm. Best visualized on transabdominal
images. Uterus is heterogeneous and may contain calcifications.

Endometrium

Not well visualized.

Right ovary

Not visualized.

Left ovary

Not visualized.

Other findings

There is a large heterogeneous echogenic structure which appears to
be within the urinary bladder. This structure measures 10.8 x 7.0 x
7.6 cm. There is no significant blood flow within this structure and
it probably represent a large bladder hematoma. There appears to be
a small amount of free fluid.
IMPRESSION: There is a large avascular heterogeneous structure situated in the
urinary bladder. Findings are most compatible with a large bladder
hematoma. Review of the recent CT suggests probable active
extravasation within the bladder lumen.

Uterus is small and heterogeneous. Ovaries and adnexa tissue are not
visualized.
